# Patient Record
Sex: Female | Born: 1945 | Race: White | Hispanic: No | Marital: Married | Smoking: Never smoker
Health system: Southern US, Community
[De-identification: ages and names within clinical notes are randomized; demographics above are authoritative.]

## PROBLEM LIST (undated history)

## (undated) DIAGNOSIS — F419 Anxiety disorder, unspecified: Secondary | ICD-10-CM

## (undated) DIAGNOSIS — IMO0002 Reserved for concepts with insufficient information to code with codable children: Secondary | ICD-10-CM

## (undated) DIAGNOSIS — D369 Benign neoplasm, unspecified site: Secondary | ICD-10-CM

## (undated) DIAGNOSIS — M549 Dorsalgia, unspecified: Secondary | ICD-10-CM

## (undated) DIAGNOSIS — M797 Fibromyalgia: Secondary | ICD-10-CM

## (undated) DIAGNOSIS — Z9889 Other specified postprocedural states: Secondary | ICD-10-CM

## (undated) DIAGNOSIS — J387 Other diseases of larynx: Secondary | ICD-10-CM

## (undated) DIAGNOSIS — E7800 Pure hypercholesterolemia, unspecified: Secondary | ICD-10-CM

## (undated) DIAGNOSIS — E119 Type 2 diabetes mellitus without complications: Secondary | ICD-10-CM

## (undated) DIAGNOSIS — I10 Essential (primary) hypertension: Secondary | ICD-10-CM

## (undated) DIAGNOSIS — M1990 Unspecified osteoarthritis, unspecified site: Secondary | ICD-10-CM

## (undated) DIAGNOSIS — E039 Hypothyroidism, unspecified: Secondary | ICD-10-CM

## (undated) DIAGNOSIS — Z8719 Personal history of other diseases of the digestive system: Secondary | ICD-10-CM

## (undated) DIAGNOSIS — K222 Esophageal obstruction: Secondary | ICD-10-CM

## (undated) DIAGNOSIS — B029 Zoster without complications: Secondary | ICD-10-CM

## (undated) DIAGNOSIS — R112 Nausea with vomiting, unspecified: Secondary | ICD-10-CM

## (undated) DIAGNOSIS — K589 Irritable bowel syndrome without diarrhea: Secondary | ICD-10-CM

## (undated) DIAGNOSIS — J449 Chronic obstructive pulmonary disease, unspecified: Secondary | ICD-10-CM

## (undated) DIAGNOSIS — K449 Diaphragmatic hernia without obstruction or gangrene: Secondary | ICD-10-CM

## (undated) DIAGNOSIS — M479 Spondylosis, unspecified: Secondary | ICD-10-CM

## (undated) DIAGNOSIS — K219 Gastro-esophageal reflux disease without esophagitis: Secondary | ICD-10-CM

## (undated) DIAGNOSIS — G8929 Other chronic pain: Secondary | ICD-10-CM

## (undated) DIAGNOSIS — Z8701 Personal history of pneumonia (recurrent): Secondary | ICD-10-CM

## (undated) HISTORY — PX: LARYNX SURGERY: SHX692

## (undated) HISTORY — DX: Esophageal obstruction: K22.2

## (undated) HISTORY — PX: APPENDECTOMY: SHX54

## (undated) HISTORY — DX: Anxiety disorder, unspecified: F41.9

## (undated) HISTORY — DX: Fibromyalgia: M79.7

## (undated) HISTORY — DX: Diaphragmatic hernia without obstruction or gangrene: K44.9

## (undated) HISTORY — DX: Other specified postprocedural states: Z98.890

## (undated) HISTORY — DX: Gastro-esophageal reflux disease without esophagitis: K21.9

## (undated) HISTORY — PX: KNEE SURGERY: SHX244

## (undated) HISTORY — DX: Irritable bowel syndrome, unspecified: K58.9

## (undated) HISTORY — PX: BLADDER SUSPENSION: SHX72

## (undated) HISTORY — DX: Personal history of other diseases of the digestive system: Z87.19

## (undated) HISTORY — PX: SINUS EXPLORATION: SHX5214

## (undated) HISTORY — DX: Unspecified osteoarthritis, unspecified site: M19.90

## (undated) HISTORY — PX: CHOLECYSTECTOMY: SHX55

## (undated) HISTORY — PX: SHOULDER SURGERY: SHX246

## (undated) HISTORY — DX: Hypothyroidism, unspecified: E03.9

## (undated) HISTORY — PX: PARTIAL HYSTERECTOMY: SHX80

## (undated) HISTORY — PX: CAROTID ENDARTERECTOMY: SUR193

## (undated) HISTORY — PX: FOOT SURGERY: SHX648

## (undated) HISTORY — DX: Pure hypercholesterolemia, unspecified: E78.00

## (undated) HISTORY — PX: LAPAROSCOPY: SHX197

## (undated) HISTORY — DX: Type 2 diabetes mellitus without complications: E11.9

## (undated) HISTORY — PX: TONSILLECTOMY AND ADENOIDECTOMY: SUR1326

## (undated) HISTORY — PX: ABDOMINAL HYSTERECTOMY: SHX81

## (undated) HISTORY — PX: TUBAL LIGATION: SHX77

## (undated) HISTORY — DX: Benign neoplasm, unspecified site: D36.9

## (undated) HISTORY — DX: Spondylosis, unspecified: M47.9

## (undated) HISTORY — DX: Personal history of pneumonia (recurrent): Z87.01

## (undated) HISTORY — PX: BREAST EXCISIONAL BIOPSY: SUR124

## (undated) HISTORY — PX: UMBILICAL HERNIA REPAIR: SHX196

---

## 2003-09-20 ENCOUNTER — Encounter: Admission: RE | Admit: 2003-09-20 | Discharge: 2003-09-20 | Payer: Self-pay | Admitting: Orthopedic Surgery

## 2007-12-31 ENCOUNTER — Ambulatory Visit: Payer: Self-pay | Admitting: Internal Medicine

## 2007-12-31 LAB — CONVERTED CEMR LAB
AST: 19 units/L (ref 0–37)
Albumin: 4 g/dL (ref 3.5–5.2)
Alkaline Phosphatase: 40 units/L (ref 39–117)
Basophils Absolute: 0.1 10*3/uL (ref 0.0–0.1)
Basophils Relative: 1 % (ref 0–1)
CO2: 24 meq/L (ref 19–32)
Eosinophils Relative: 5 % (ref 0–5)
HCT: 39.4 % (ref 36.0–46.0)
Hemoglobin: 13.1 g/dL (ref 12.0–15.0)
Indirect Bilirubin: 0.5 mg/dL (ref 0.0–0.9)
Lymphocytes Relative: 27 % (ref 12–46)
MCHC: 33.2 g/dL (ref 30.0–36.0)
Monocytes Absolute: 0.9 10*3/uL (ref 0.1–1.0)
Neutro Abs: 4.8 10*3/uL (ref 1.7–7.7)
Neutrophils Relative %: 56 % (ref 43–77)
Platelets: 243 10*3/uL (ref 150–400)
Potassium: 4.4 meq/L (ref 3.5–5.3)
RBC: 4.35 M/uL (ref 3.87–5.11)
Total Protein: 6.6 g/dL (ref 6.0–8.3)

## 2008-01-03 ENCOUNTER — Ambulatory Visit (HOSPITAL_COMMUNITY): Admission: RE | Admit: 2008-01-03 | Discharge: 2008-01-03 | Payer: Self-pay | Admitting: Internal Medicine

## 2008-01-25 ENCOUNTER — Ambulatory Visit: Payer: Self-pay | Admitting: Internal Medicine

## 2008-01-25 ENCOUNTER — Ambulatory Visit (HOSPITAL_COMMUNITY): Admission: RE | Admit: 2008-01-25 | Discharge: 2008-01-25 | Payer: Self-pay | Admitting: Internal Medicine

## 2008-02-22 ENCOUNTER — Ambulatory Visit: Payer: Self-pay | Admitting: Gastroenterology

## 2008-03-02 ENCOUNTER — Ambulatory Visit (HOSPITAL_COMMUNITY): Admission: RE | Admit: 2008-03-02 | Discharge: 2008-03-02 | Payer: Self-pay | Admitting: Internal Medicine

## 2008-03-02 ENCOUNTER — Ambulatory Visit: Payer: Self-pay | Admitting: Internal Medicine

## 2008-03-02 ENCOUNTER — Encounter: Payer: Self-pay | Admitting: Internal Medicine

## 2008-03-14 ENCOUNTER — Ambulatory Visit (HOSPITAL_COMMUNITY): Admission: RE | Admit: 2008-03-14 | Discharge: 2008-03-14 | Payer: Self-pay | Admitting: Otolaryngology

## 2008-03-17 ENCOUNTER — Encounter (INDEPENDENT_AMBULATORY_CARE_PROVIDER_SITE_OTHER): Payer: Self-pay

## 2008-03-30 ENCOUNTER — Ambulatory Visit (HOSPITAL_COMMUNITY): Admission: RE | Admit: 2008-03-30 | Discharge: 2008-03-30 | Payer: Self-pay | Admitting: Internal Medicine

## 2008-09-17 ENCOUNTER — Emergency Department (HOSPITAL_COMMUNITY): Admission: EM | Admit: 2008-09-17 | Discharge: 2008-09-17 | Payer: Self-pay | Admitting: Emergency Medicine

## 2009-07-13 ENCOUNTER — Encounter: Admission: RE | Admit: 2009-07-13 | Discharge: 2009-07-13 | Payer: Self-pay | Admitting: Orthopedic Surgery

## 2009-08-25 ENCOUNTER — Emergency Department (HOSPITAL_COMMUNITY): Admission: EM | Admit: 2009-08-25 | Discharge: 2009-08-25 | Payer: Self-pay | Admitting: Emergency Medicine

## 2010-02-17 ENCOUNTER — Encounter: Payer: Self-pay | Admitting: Otolaryngology

## 2010-02-17 ENCOUNTER — Encounter: Payer: Self-pay | Admitting: Internal Medicine

## 2010-03-04 ENCOUNTER — Ambulatory Visit (HOSPITAL_COMMUNITY)
Admission: RE | Admit: 2010-03-04 | Discharge: 2010-03-04 | Disposition: A | Discharge status: Home / Self Care | Payer: Medicare Other | Source: Ambulatory Visit | Attending: Pulmonary Disease | Admitting: Pulmonary Disease

## 2010-03-04 ENCOUNTER — Other Ambulatory Visit (HOSPITAL_COMMUNITY): Payer: Self-pay | Admitting: Pulmonary Disease

## 2010-03-04 DIAGNOSIS — R0602 Shortness of breath: Secondary | ICD-10-CM

## 2010-03-04 LAB — BLOOD GAS, ARTERIAL
Bicarbonate: 25.3 mEq/L — ABNORMAL HIGH (ref 20.0–24.0)
O2 Saturation: 95.2 %
Patient temperature: 37
TCO2: 22.2 mmol/L (ref 0–100)
pCO2 arterial: 40.2 mmHg (ref 35.0–45.0)
pH, Arterial: 7.416 — ABNORMAL HIGH (ref 7.350–7.400)

## 2010-04-07 ENCOUNTER — Encounter: Payer: Self-pay | Admitting: Orthopedic Surgery

## 2010-04-07 ENCOUNTER — Emergency Department (HOSPITAL_COMMUNITY): Payer: Medicare Other

## 2010-04-07 ENCOUNTER — Emergency Department (HOSPITAL_COMMUNITY)
Admission: EM | Admit: 2010-04-07 | Discharge: 2010-04-07 | Disposition: A | Discharge status: Home / Self Care | Payer: Medicare Other | Attending: Emergency Medicine | Admitting: Emergency Medicine

## 2010-04-07 DIAGNOSIS — X500XXA Overexertion from strenuous movement or load, initial encounter: Secondary | ICD-10-CM | POA: Insufficient documentation

## 2010-04-07 DIAGNOSIS — Y92009 Unspecified place in unspecified non-institutional (private) residence as the place of occurrence of the external cause: Secondary | ICD-10-CM | POA: Insufficient documentation

## 2010-04-07 DIAGNOSIS — IMO0002 Reserved for concepts with insufficient information to code with codable children: Secondary | ICD-10-CM | POA: Insufficient documentation

## 2010-04-09 ENCOUNTER — Encounter: Payer: Self-pay | Admitting: Orthopedic Surgery

## 2010-04-10 ENCOUNTER — Ambulatory Visit (INDEPENDENT_AMBULATORY_CARE_PROVIDER_SITE_OTHER): Payer: Medicare Other | Admitting: Orthopedic Surgery

## 2010-04-10 ENCOUNTER — Encounter: Payer: Self-pay | Admitting: Orthopedic Surgery

## 2010-04-10 DIAGNOSIS — IMO0002 Reserved for concepts with insufficient information to code with codable children: Secondary | ICD-10-CM | POA: Insufficient documentation

## 2010-04-11 ENCOUNTER — Encounter: Payer: Self-pay | Admitting: Orthopedic Surgery

## 2010-04-12 ENCOUNTER — Encounter: Payer: Self-pay | Admitting: Orthopedic Surgery

## 2010-04-15 ENCOUNTER — Telehealth: Payer: Self-pay | Admitting: Orthopedic Surgery

## 2010-04-15 ENCOUNTER — Other Ambulatory Visit: Payer: Self-pay | Admitting: Orthopedic Surgery

## 2010-04-15 DIAGNOSIS — S83206A Unspecified tear of unspecified meniscus, current injury, right knee, initial encounter: Secondary | ICD-10-CM

## 2010-04-16 NOTE — Letter (Signed)
Summary: History form  History form   Imported By: Jacklynn Ganong 04/12/2010 11:35:36  _____________________________________________________________________  External Attachment:    Type:   Image     Comment:   External Document

## 2010-04-16 NOTE — Miscellaneous (Signed)
  Clinical Lists Changes  Medications: Added new medication of HYDROCODONE-ACETAMINOPHEN 7.5-325 MG TABS (HYDROCODONE-ACETAMINOPHEN) 1 q 4 for pain - Signed Added new medication of VALIUM 5 MG TABS (DIAZEPAM) 1 by mouth bef mri - Signed Added new medication of PROMETHAZINE HCL 25 MG TABS (PROMETHAZINE HCL) 1 by mouth q 4 as needed nausea - Signed Rx of HYDROCODONE-ACETAMINOPHEN 7.5-325 MG TABS (HYDROCODONE-ACETAMINOPHEN) 1 q 4 for pain;  #40 x 0;  Signed;  Entered by: Fuller Canada MD;  Authorized by: Fuller Canada MD;  Method used: Print then Give to Patient Rx of VALIUM 5 MG TABS (DIAZEPAM) 1 by mouth bef mri;  #1 x 0;  Signed;  Entered by: Fuller Canada MD;  Authorized by: Fuller Canada MD;  Method used: Print then Give to Patient Rx of PROMETHAZINE HCL 25 MG TABS (PROMETHAZINE HCL) 1 by mouth q 4 as needed nausea;  #40 x 0;  Signed;  Entered by: Fuller Canada MD;  Authorized by: Fuller Canada MD;  Method used: Print then Give to Patient    Prescriptions: PROMETHAZINE HCL 25 MG TABS (PROMETHAZINE HCL) 1 by mouth q 4 as needed nausea  #40 x 0   Entered and Authorized by:   Fuller Canada MD   Signed by:   Fuller Canada MD on 04/11/2010   Method used:   Print then Give to Patient   RxID:   [id number] VALIUM 5 MG TABS (DIAZEPAM) 1 by mouth bef mri  #1 x 0   Entered and Authorized by:   Fuller Canada MD   Signed by:   Fuller Canada MD on 04/11/2010   Method used:   Print then Give to Patient   RxID:   [id number] HYDROCODONE-ACETAMINOPHEN 7.5-325 MG TABS (HYDROCODONE-ACETAMINOPHEN) 1 q 4 for pain  #40 x 0   Entered and Authorized by:   Fuller Canada MD   Signed by:   Fuller Canada MD on 04/11/2010   Method used:   Print then Give to Patient   RxID:   [id number]

## 2010-04-16 NOTE — Assessment & Plan Note (Signed)
Summary: [facility] FOL/UP/RT KNEE"TORN LIGAMENTS"/BLUE MED/CAF   Vital Signs:  Patient profile:   65 year old female Height:      62 inches Weight:      188 pounds Pulse rate:   76 / minute Resp:     16 per minute  Vitals Entered By: Fuller Canada MD (April 10, 2010 11:13 AM)  Visit Type:  new patient Referring Provider:  [facility] Primary Provider:  Dr. Catalina Pizza  CC:  right knee pain.  History of Present Illness: I saw Krystal Powell in the office today for an initial visit.  She is a 65 years old woman with the complaint of:  right knee pain.  Pain since 04/06/10, was getting up from commode and had alot of pain.  Xrays [facility] 04/07/10 right knee.  Meds: Xanax, Prevacid, Synthroid, Atenolol, singulair, Flonase, Fenofibrate, Meclizine, Estropipate, Lasix, Potassium, Lisinopril, Tylenol.  The patient was getting up from a elevated toilet seat felt acute pop in her RIGHT knee with acute pain in the lateral side which felt like burning sensation, started 3 days ago.  The patient tried to bear weight in the leg locked.  As long as she doesn't walk or try to put weight on her leg she does fine.  She also has a joint effusion.  She cannot bend her knee.  Pain level is 10.  She had 2 RIGHT knee arthroscopies one in [place] and one in [place] she also had a LEFT knee arthroscopy in [place].        Allergies (verified): 1)  ! Augmentin 2)  ! Codeine 3)  ! * Declomycin 4)  ! Erythromycin 5)  ! Iodine 6)  ! Sulfa 7)  ! Thorazine  Past History:  Past Medical History: copd gerd asthma arthritis vertigo thyroid htn seasonal allergies  Past Surgical History: tonsils and adenoids bilateral knee arthroscopies foot shoulder hysterectomy  Family History: Family History of Diabetes Family History Coronary Heart Disease female < 23 Family History of Arthritis Hx, family, chronic respiratory condition Hx, family, asthma Hx, family, kidney disease NEC  Social  History: Patient is married.  unemployed no smoking no alcohol no caffeine some college  Review of Systems Constitutional:  Denies weight loss, weight gain, fever, chills, and fatigue. Cardiovascular:  Complains of palpitations; denies chest pain, fainting, and murmurs. Respiratory:  Complains of short of breath, wheezing, and snoring; denies couch, tightness, pain on inspiration, and snoring ; copd. Gastrointestinal:  Denies heartburn, nausea, vomiting, diarrhea, constipation, and blood in your stools. Genitourinary:  Denies frequency, urgency, difficulty urinating, painful urination, flank pain, and bleeding in urine. Neurologic:  Complains of dizziness; denies numbness, tingling, unsteady gait, tremors, and seizure. Musculoskeletal:  Complains of joint pain, swelling, instability, and stiffness; denies redness, heat, and muscle pain. Endocrine:  Complains of excessive thirst; denies exessive urination and heat or cold intolerance. Psychiatric:  Complains of anxiety; denies nervousness, depression, and hallucinations. Skin:  Denies changes in the skin, poor healing, rash, itching, and redness. HEENT:  Denies blurred or double vision, eye pain, redness, and watering. Immunology:  Complains of seasonal allergies; denies sinus problems and allergic to bee stings. Hemoatologic:  Complains of brusing; denies easy bleeding.  Physical Exam  Skin:  intact without lesions or rashes Inguinal Nodes:  no significant adenopathy Psych:  alert and cooperative; normal mood and affect; normal attention span and concentration Additional Exam:   RIGHT knee examination  The patient has lateral joint line tenderness.  Her knee flexion is  only 50 with severe pain.  The collateral ligaments are stable Lachman and anterior posterior drawer tests are normal as performed.  The McMurray's test was very difficult because I can only get the knee to 50 flexion.  Her muscle tone was normal in her quadriceps  muscle.  She did have a joint effusion.     Knee Exam  General:    Well-developed, well-nourished, normal body habitus; no deformities, normal grooming.  Gait:    her ambulation is supported by crutches and a straight leg immobilizer  Vascular:    There was no swelling or varicose veins. The pulses and temperature are normal. There was no edema or tenderness.  Sensory:    Gross coordination and sensation were normal.    Motor:    muscle tone is normal and she motor exam is graded 5  Reflexes:    reflexes are deferred   Impression & Recommendations:  Problem # 1:  TEAR MEDIAL MENISCUS (ICD-836.0) Assessment New  plain films were taken at the hospital they were normal.  I reviewed them and the x-ray report there was a small joint effusion.  Indications for MRI right knee  #1 acute trauma significant #2 joint locking #3 joint instability sensation of giving way.   #4 Lateral joint line tenderness  Orders: New Patient Level III (11914)  Patient Instructions: 1)  MRI RIGHT KNEE  2)  CONTINUE BRACE AND WALKER OR CRUTCHES    Orders Added: 1)  New Patient Level III [78295]

## 2010-04-16 NOTE — Medication Information (Signed)
Summary: copy of prescription  copy of prescription   Imported By: Jacklynn Ganong 04/12/2010 11:03:46  _____________________________________________________________________  External Attachment:    Type:   Image     Comment:   External Document

## 2010-04-17 ENCOUNTER — Ambulatory Visit (HOSPITAL_COMMUNITY)
Admission: RE | Admit: 2010-04-17 | Discharge: 2010-04-17 | Disposition: A | Discharge status: Home / Self Care | Payer: Medicare Other | Source: Ambulatory Visit | Attending: Orthopedic Surgery | Admitting: Orthopedic Surgery

## 2010-04-17 DIAGNOSIS — M23349 Other meniscus derangements, anterior horn of lateral meniscus, unspecified knee: Secondary | ICD-10-CM | POA: Insufficient documentation

## 2010-04-17 DIAGNOSIS — S83206A Unspecified tear of unspecified meniscus, current injury, right knee, initial encounter: Secondary | ICD-10-CM

## 2010-04-17 DIAGNOSIS — M25569 Pain in unspecified knee: Secondary | ICD-10-CM | POA: Insufficient documentation

## 2010-04-17 DIAGNOSIS — M25469 Effusion, unspecified knee: Secondary | ICD-10-CM | POA: Insufficient documentation

## 2010-04-25 NOTE — Progress Notes (Signed)
Summary: MRI appointment.  Phone Note Outgoing Call   Call placed by: Waldon Reining,  April 15, 2010 12:14 PM Call placed to: Patient Action Taken: Appt scheduled Summary of Call: I called to give the patient her MRI appointment at [facility] on 04-17-10 at 11:30. Patient has [facility], authorization [id number] and it expires on 05-15-10. Patient will follow up back here on 05-09-10 at 10:30 for her results.

## 2010-05-04 LAB — URINE MICROSCOPIC-ADD ON

## 2010-05-04 LAB — URINE CULTURE

## 2010-05-04 LAB — URINALYSIS, ROUTINE W REFLEX MICROSCOPIC
Ketones, ur: NEGATIVE mg/dL
Nitrite: NEGATIVE
pH: 6 (ref 5.0–8.0)

## 2010-05-09 ENCOUNTER — Ambulatory Visit (INDEPENDENT_AMBULATORY_CARE_PROVIDER_SITE_OTHER): Payer: Medicare Other | Admitting: Orthopedic Surgery

## 2010-05-09 DIAGNOSIS — S83289A Other tear of lateral meniscus, current injury, unspecified knee, initial encounter: Secondary | ICD-10-CM

## 2010-05-09 NOTE — Progress Notes (Signed)
Pain RIGHT knee.  Patient set up from the commode and felt 1/10 pain in her RIGHT knee on the lateral side. Complained of severe pain inability to walk and was sent for MRI, which shows has a lateral meniscal tear.  She's had arthroscopies before, she is familiar with the procedure. We went over the procedure again. Informed consent was done here in the office.  Reexamination shows lateral joint line tenderness, positive McMurray sign.  Patient agrees that surgery is in her best interest.  Arthroscopy RIGHT knee partial lateral meniscectomy.  Dictated history and physical labor.  Incorporate by reference.

## 2010-05-14 LAB — GLUCOSE, CAPILLARY

## 2010-05-14 LAB — BASIC METABOLIC PANEL
GFR calc Af Amer: 46 mL/min — ABNORMAL LOW (ref >60)
Potassium: 4.4 mEq/L (ref 3.5–5.1)

## 2010-05-20 ENCOUNTER — Ambulatory Visit (HOSPITAL_COMMUNITY)
Admission: RE | Admit: 2010-05-20 | Discharge: 2010-05-20 | Disposition: A | Discharge status: Home / Self Care | Payer: Medicare Other | Source: Ambulatory Visit | Attending: Specialist | Admitting: Specialist

## 2010-05-20 DIAGNOSIS — J449 Chronic obstructive pulmonary disease, unspecified: Secondary | ICD-10-CM | POA: Insufficient documentation

## 2010-05-20 DIAGNOSIS — R262 Difficulty in walking, not elsewhere classified: Secondary | ICD-10-CM | POA: Insufficient documentation

## 2010-05-20 DIAGNOSIS — M6281 Muscle weakness (generalized): Secondary | ICD-10-CM | POA: Insufficient documentation

## 2010-05-20 DIAGNOSIS — J4489 Other specified chronic obstructive pulmonary disease: Secondary | ICD-10-CM | POA: Insufficient documentation

## 2010-05-20 DIAGNOSIS — M25669 Stiffness of unspecified knee, not elsewhere classified: Secondary | ICD-10-CM | POA: Insufficient documentation

## 2010-05-20 DIAGNOSIS — IMO0001 Reserved for inherently not codable concepts without codable children: Secondary | ICD-10-CM | POA: Insufficient documentation

## 2010-05-20 DIAGNOSIS — M25569 Pain in unspecified knee: Secondary | ICD-10-CM | POA: Insufficient documentation

## 2010-05-20 DIAGNOSIS — M25469 Effusion, unspecified knee: Secondary | ICD-10-CM | POA: Insufficient documentation

## 2010-05-21 ENCOUNTER — Encounter (HOSPITAL_COMMUNITY): Payer: Medicare Other

## 2010-05-21 ENCOUNTER — Other Ambulatory Visit: Payer: Self-pay | Admitting: Orthopedic Surgery

## 2010-05-21 ENCOUNTER — Telehealth: Payer: Self-pay | Admitting: Orthopedic Surgery

## 2010-05-21 LAB — BASIC METABOLIC PANEL
CO2: 31 mEq/L (ref 19–32)
Chloride: 102 mEq/L (ref 96–112)
GFR calc non Af Amer: 45 mL/min — ABNORMAL LOW (ref >60)
Glucose, Bld: 103 mg/dL — ABNORMAL HIGH (ref 70–99)
Potassium: 4.1 mEq/L (ref 3.5–5.1)
Sodium: 139 mEq/L (ref 135–145)

## 2010-05-21 LAB — CBC
HCT: 40.8 % (ref 36.0–46.0)
Hemoglobin: 13.6 g/dL (ref 12.0–15.0)
MCV: 91.3 fL (ref 78.0–100.0)
RBC: 4.47 MIL/uL (ref 3.87–5.11)
RDW: 12.6 % (ref 11.5–15.5)
WBC: 8.2 10*3/uL (ref 4.0–10.5)

## 2010-05-21 NOTE — Telephone Encounter (Addendum)
Contacted insurer [facility] Medicare for pre-certification information,out-patient surgery scheduled 05/24/10, [facility], [place]  [id number], ICD9 [phone].  Left message on automated system, main ph# [phone] + left voicemail message at local [facility] office for Debbie, utilization review at [phone].

## 2010-05-24 ENCOUNTER — Ambulatory Visit (HOSPITAL_COMMUNITY)
Admission: RE | Admit: 2010-05-24 | Discharge: 2010-05-24 | Disposition: A | Discharge status: Home / Self Care | Payer: Medicare Other | Source: Ambulatory Visit | Attending: Orthopedic Surgery | Admitting: Orthopedic Surgery

## 2010-05-24 DIAGNOSIS — S83259A Bucket-handle tear of lateral meniscus, current injury, unspecified knee, initial encounter: Secondary | ICD-10-CM

## 2010-05-24 DIAGNOSIS — I10 Essential (primary) hypertension: Secondary | ICD-10-CM | POA: Insufficient documentation

## 2010-05-24 DIAGNOSIS — Z01812 Encounter for preprocedural laboratory examination: Secondary | ICD-10-CM | POA: Insufficient documentation

## 2010-05-24 DIAGNOSIS — S83289A Other tear of lateral meniscus, current injury, unspecified knee, initial encounter: Secondary | ICD-10-CM

## 2010-05-24 DIAGNOSIS — M23349 Other meniscus derangements, anterior horn of lateral meniscus, unspecified knee: Secondary | ICD-10-CM | POA: Insufficient documentation

## 2010-05-24 DIAGNOSIS — M23329 Other meniscus derangements, posterior horn of medial meniscus, unspecified knee: Secondary | ICD-10-CM

## 2010-05-24 DIAGNOSIS — Z79899 Other long term (current) drug therapy: Secondary | ICD-10-CM | POA: Insufficient documentation

## 2010-05-24 DIAGNOSIS — M2340 Loose body in knee, unspecified knee: Secondary | ICD-10-CM | POA: Insufficient documentation

## 2010-05-27 ENCOUNTER — Ambulatory Visit: Payer: Medicare Other | Admitting: Orthopedic Surgery

## 2010-05-27 ENCOUNTER — Encounter: Payer: Self-pay | Admitting: Orthopedic Surgery

## 2010-05-27 ENCOUNTER — Ambulatory Visit (INDEPENDENT_AMBULATORY_CARE_PROVIDER_SITE_OTHER): Payer: Medicare Other | Admitting: Orthopedic Surgery

## 2010-05-27 DIAGNOSIS — M23359 Other meniscus derangements, posterior horn of lateral meniscus, unspecified knee: Secondary | ICD-10-CM

## 2010-05-27 DIAGNOSIS — M23329 Other meniscus derangements, posterior horn of medial meniscus, unspecified knee: Secondary | ICD-10-CM

## 2010-05-27 DIAGNOSIS — M2340 Loose body in knee, unspecified knee: Secondary | ICD-10-CM

## 2010-05-27 NOTE — Progress Notes (Signed)
Status post arthroscopy of the RIGHT knee for a lateral meniscal tear. She also had a medial meniscal tear, chondral fracture of the lateral femoral condyle with a loose body from that area.  Procedure partial medial meniscectomy, partial lateral meniscectomy, abrasion chondroplasty, lateral femoral condyle, removal of loose body.  Operative findings tear of the posterior horn of the lateral meniscus, degenerative, lateral meniscal tear, anterior horn. Chondral fracture, lateral femoral condyle, measuring 8 x 6 mm. This formed a loose body. There was a tear of the posterior horn of the medial meniscus. 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  Date of surgery April 27  Minimal complaints.  Ambulates with walker, full weightbearing. Knee range of motion 95. Portals clean.  Return 3 weeks. Will probably do on her own for PT after one visit

## 2010-05-28 ENCOUNTER — Telehealth: Payer: Self-pay | Admitting: Orthopedic Surgery

## 2010-05-28 ENCOUNTER — Ambulatory Visit (HOSPITAL_COMMUNITY)
Admission: RE | Admit: 2010-05-28 | Discharge: 2010-05-28 | Disposition: A | Discharge status: Home / Self Care | Payer: Medicare Other | Source: Ambulatory Visit | Attending: Internal Medicine | Admitting: Internal Medicine

## 2010-05-28 DIAGNOSIS — J449 Chronic obstructive pulmonary disease, unspecified: Secondary | ICD-10-CM | POA: Insufficient documentation

## 2010-05-28 DIAGNOSIS — M6281 Muscle weakness (generalized): Secondary | ICD-10-CM | POA: Insufficient documentation

## 2010-05-28 DIAGNOSIS — IMO0001 Reserved for inherently not codable concepts without codable children: Secondary | ICD-10-CM | POA: Insufficient documentation

## 2010-05-28 DIAGNOSIS — R262 Difficulty in walking, not elsewhere classified: Secondary | ICD-10-CM | POA: Insufficient documentation

## 2010-05-28 DIAGNOSIS — M25569 Pain in unspecified knee: Secondary | ICD-10-CM | POA: Insufficient documentation

## 2010-05-28 DIAGNOSIS — M25669 Stiffness of unspecified knee, not elsewhere classified: Secondary | ICD-10-CM | POA: Insufficient documentation

## 2010-05-28 DIAGNOSIS — J4489 Other specified chronic obstructive pulmonary disease: Secondary | ICD-10-CM | POA: Insufficient documentation

## 2010-05-28 NOTE — Discharge Summary (Signed)
[name], [name]               ACCOUNT NO.:  [id number]  MEDICAL RECORD NO.:  [id number]           PATIENT TYPE:  O  LOCATION:  DAY                           FACILITY:  [facility]  PHYSICIAN:  Vickki Hearing, M.D.DATE OF BIRTH:  04-18-1945  DATE OF ADMISSION:  05/15/2010 DATE OF DISCHARGE:  LH                         DISCHARGE SUMMARY-REFERRING   History and physical for surgery scheduled for May 24, 2010.  PATIENT PROFILE:  A 65 year old female with a height of 62 inches and a weight of 188 pounds, pulse rate of 76 and respiratory rate of 16.  PRIMARY CARE PROVIDER:  Catalina Pizza, MD  CHIEF COMPLAINT:  Right knee pain.  HISTORY:  Krystal Powell is a lady I have followed, she is 65 years old. She has right knee pain since April 06, 2010 when she was getting up from the commode.  She had increased pain at that time and was treated nonoperatively.  X-rays at the hospital on April 07, 2010, showed no fracture.  She felt a pop when she got up, acute pain on the lateral side of the knee, which felt like a burning sensation.  She tried to bear weight the leg locked.  Since that time, she had trouble walking. She had a joint effusion, could not bend her knee.  Her pain level was initially at 10.  She has had 2 right knee arthroscopies one in [facility], one in [place].  Krystal Powell has had a left knee arthroscopy in [place].  She did have an MRI on April 15, 2010, and it shows that she has a tear of the anterior horn of lateral meniscus edema in the posterior aspect of the lateral femoral condyle, which may be overlying with some chondromalacia.  She has a blunting of the posterior horn of the medial meniscus from previous meniscectomy.  As I discussed with her, she may have arthritis.  This may be an acute meniscal tear.  She has opted to have the surgery.  ALLERGIES:  She is allergic to AUGMENTIN, CODEINE, DECLOMYCIN, ERYTHROMYCIN IV, SULFA and THORAZINE.  PAST  HISTORY: 1. COPD. 2. Reflux. 3. Asthma. 4. Arthritis. 5. Vertigo. 6. Thyroid disease. 7. Hypertension. 8. Seasonal allergies.  PREVIOUS SURGERIES: 1. Tonsillectomy. 2. Adenoidectomy. 3. Foot surgery. 4. Shoulder surgery. 5. Hysterectomy.  FAMILY HISTORY:  Noted for diabetes, heart disease, arthritis, chronic respiratory condition, asthma, and kidney disease.  SOCIAL HISTORY:  She is married, unemployed.  Does not smoke or drink. She does have some college credits.  REVIEW OF SYSTEMS:  Positive for palpitations, shortness of breath, wheezing, snoring, dizziness, joint pain, swelling, instability, stiffness, excessive thirst, anxiety, seasonal allergies, bruising easily.  Other systems reviewed were negative.  PHYSICAL EXAMINATION:  GENERAL:  Well-developed, well-nourished, normal body habitus.  No deformities.  Normal grooming.  Ambulation is notable for limp. VASCULAR:  No swelling or varicose veins.  Pulse and temperature normal. No edema or tenderness. SENSORY:  Grossly normal. MOTOR:  Normal with motor grade 5.  Reflexes were initially deferred, but subsequently were normal.  She has lateral joint line tenderness with flexion limited less than 90 degrees or Lachman test  is stable.  Posterior drawer test is normal.  Maneuver tests was difficult, but seemed to be positive.  She had normal quadriceps muscle tone and with the joint effusion.  She is awake, alert and oriented x3.  Normal mood and affect, attention span and concentration.  Inguinal lymph nodes were normal.  Skin was intact.  Upper extremities were normal.  IMPRESSION:  Osteoarthritis of torn lateral meniscus.  PLAN:  Arthroscopy, right knee partial lateral meniscectomy, scheduled for postop visit May 27, 2010.     Vickki Hearing, M.D.     SEH/MEDQ  D:  05/23/2010  T:  05/23/2010  Job:  [id number]  Electronically Signed by Fuller Canada M.D. on 05/28/2010 09:56:33 AM

## 2010-05-28 NOTE — Op Note (Signed)
NAMEJAZSMIN, COUSE               ACCOUNT NO.:  [id number]  MEDICAL RECORD NO.:  [id number]           PATIENT TYPE:  O  LOCATION:  DAYP                          FACILITY:  [facility]  PHYSICIAN:  Vickki Hearing, M.D.DATE OF BIRTH:  1945-12-30  DATE OF PROCEDURE: DATE OF DISCHARGE:                              OPERATIVE REPORT   Ms. Andringa is 65 years old.  She got up from the commode, felt a loud snap and pain on the lateral side of her knee and eventually had an MRI which showed a lateral meniscal tear along with some arthritis.  After failing to improve with nonoperative treatment, she was advised that surgical treatment would be in her best interest.  The risk benefit ratio of the surgical and nonoperative treatments were presented to the patient and the patient opted for surgical treatment.  PREOPERATIVE DIAGNOSIS:  Lateral meniscal tear, right knee.  POSTOPERATIVE DIAGNOSIS:  Lateral meniscal tear, medial meniscal tear, chondral fracture, lateral femoral condyle loose body.  PROCEDURE:  Arthroscopy right knee partial medial meniscectomy, partial lateral meniscectomy, abrasion chondroplasty, lateral femoral condyle and removal of loose body.  SURGEON:  Vickki Hearing, MD.  ANESTHETIC:  General.  OPERATIVE FINDINGS:  There was a tear of the posterior horn of the lateral meniscus degenerative, lateral meniscal tear anterior horn. There was a chondral fracture of the lateral femoral condyle approximately 8 x 6 mm.  There was a loose body and there was a tear of the posterior horn of the medial meniscus.  There was some arthritis of the trochlea.  DETAILS OF PROCEDURE:  The patient was identified in the preop area, the right knee was marked for surgery and countersigned.  The patient was marked, reviewed the chart and updated it.  The patient was given a preop dose of Benadryl followed by Rocephin due to several MYCIN allergy, she tolerated that well.  She was  taken to the operating room for general anesthesia.  At that point, we prepped the knee and leg on the right and then draped sterilely.  It was in an arthroscopic leg holder and the opposite leg was padded in a well-leg holder.  After completing the time-out, a lateral portal was established and the scope was introduced to the medial compartment and diagnostic arthroscopy was commenced.  We saw a tear of the posterior horn of the medial meniscus, grade 1 changes of the medial femoral condyle and medial tibial plateau.  ACL had some degenerative changes, but was stable.  The lateral meniscus posterior horn had a free edge tear and then the lateral meniscus anterior horn had a longitudinal tear.  There was also a chondral lesion of the lateral femoral condyle with a loose body.  The trochlea was noted to be degenerative with grade 2 changes and the patella was relatively normal.  Through a medial portal, we debrided the medial meniscus and balanced it with a shaver removing meniscal fragments.  Posterior horn was confirmed to be stable using a probe.  On the lateral side, we removed the loose body, used a shaver to resect the free edge of the posterior horn  as well as the anterior horn probe to confirm stability of the rim.  We then did an abrasion arthroplasty of the lateral femoral condyle with a shaver until a bleeding bed was noted.  The knee was then irrigated and closed with 3-0 nylon interrupted sutures.  We injected 60 mL of Marcaine with epinephrine, applied sterile dressings, Ace wrap and activated a cryo cuff.  The patient was extubated and taken to recovery room in stable condition.  She will be weightbearing as tolerated.  She is discharged on hydrocodone 7.5 mg one q. 4 p.r.n. for pain, and follow up visit has been scheduled for Monday with physical therapy to start Tuesday.     Vickki Hearing, M.D.     SEH/MEDQ  D:  05/24/2010  T:  05/24/2010  Job:   [id number]  Electronically Signed by Fuller Canada M.D. on 05/28/2010 09:56:37 AM

## 2010-06-04 ENCOUNTER — Ambulatory Visit (HOSPITAL_COMMUNITY): Payer: Medicare Other

## 2010-06-05 ENCOUNTER — Ambulatory Visit (HOSPITAL_COMMUNITY)
Admission: RE | Admit: 2010-06-05 | Discharge: 2010-06-05 | Disposition: A | Discharge status: Home / Self Care | Payer: Medicare Other | Source: Ambulatory Visit | Attending: Allergy and Immunology | Admitting: Allergy and Immunology

## 2010-06-05 ENCOUNTER — Other Ambulatory Visit: Payer: Self-pay | Admitting: Allergy and Immunology

## 2010-06-05 DIAGNOSIS — J329 Chronic sinusitis, unspecified: Secondary | ICD-10-CM

## 2010-06-05 DIAGNOSIS — R05 Cough: Secondary | ICD-10-CM

## 2010-06-05 DIAGNOSIS — R059 Cough, unspecified: Secondary | ICD-10-CM

## 2010-06-05 DIAGNOSIS — J309 Allergic rhinitis, unspecified: Secondary | ICD-10-CM

## 2010-06-11 NOTE — Op Note (Signed)
NAME:  Krystal Powell, Krystal Powell               ACCOUNT NO.:  [id number]   MEDICAL RECORD NO.:  [id number]          PATIENT TYPE:  AMB   LOCATION:  DAY                           FACILITY:  [facility]   PHYSICIAN:  R. Roetta Sessions, M.D. DATE OF BIRTH:  01-Mar-1945   DATE OF PROCEDURE:  01/25/2008  DATE OF DISCHARGE:                               OPERATIVE REPORT   EGD with [name] dilation.   INDICATIONS FOR PROCEDURE:  A 65 year old lady with multiple medical  problems including history of upper abdominal pain beginning this month  in the setting of constipation, and she presented with quite prominent  symptoms when I saw her on December 31, 2007.  Workup ensued including CT  of the abdomen and pelvis which revealed no acute inflammatory  neoplastic findings to explain her symptoms.  She also underwent a  battery of labs including amylase and lipase which came back normal at  23 and 30 respectively.  CBC came back entirely normal.  Creatinine was  slightly elevated at 1.35.  LFTs completely normal.  She says with  improvement in constipation her abdominal pain has settled down,  although she does have esophageal dysphagia to solids and has been  dilated by Dr. Abagail Kitchens over at [facility] previously.  She has  a history colonic adenomas and is due for surveillance soon.  EGD is now  being done to further evaluate her symptoms.  Risks, benefits,  alternatives, limitations have been reviewed, questions answered.  She  is agreeable.  Please see the documentation in the medical record.   PROCEDURE NOTE:  O2 saturation, blood pressure, pulse, respirations were  monitored throughout the entirety of the procedure.   CONSCIOUS SEDATION:  Versed 5 mg IV and Demerol 100 mg IV in divided  doses.  Phenergan 12.5 mg diluted slow IV push to augment conscious  sedation.  Cetacaine spray for topical pharyngeal anesthesia.   INSTRUMENT:  Pentax video chip system.   FINDINGS:  Examination of the tubular  esophagus revealed some degree of  corkscrewing distally.  There was a spiral incomplete noncritical  appearing ring at the EG junction.  Please see photos.  There was no  inflammatory neoplastic changes.  EG junction was widely patent, easily  traversed.  Stomach:  Gastric cavity was empty, insufflated well with  air.  Thorough examination of the gastric mucosa including retroflex of  the proximal stomach esophagogastric junction demonstrated only a hiatal  hernia and a couple of focal antral/prepyloric erosions.  There is no  infiltrating process or ulcer.  Pylorus was patent, easily traversed.  Examination of the bulb and second portion revealed no abnormalities.   THERAPEUTIC/DIAGNOSTIC MANEUVERS PERFORMED:  Scope was withdrawn.  A 56-  French Maloney dilator was passed to full insertion with ease.  A look  back revealed no apparent complication related to passage of the  dilator.  The patient tolerated the procedure well, was reactive to  Endoscopy.   IMPRESSION:  Some corkscrewing of the distal esophagus, incomplete  noncritical appearing Schatzki ring status post passage of a Maloney  dilator (56-French) as described above.  Otherwise,  unremarkable  esophagus, hiatal hernia, focal antral pyloric channel erosions,  otherwise gastric mucosa appeared unremarkable.  Pylorus was patent.  Normal D1 and D2.   RECOMMENDATIONS:  1. Continue Aciphex 20 mg orally daily.  2. We will have Ms. Sefcik return for followup in 1 month.  At that      point in time, we will get her setup for a colonoscopy.  Per her      prior observations when she has had colonoscopies previously, she      has not been adequately sedated and the exam is quite uncomfortable      for her, and therefore we will move towards setting her up the      first of the year for colonoscopy under propofol sedation.      Jonathon Bellows, M.D.  Electronically Signed     RMR/MEDQ  D:  01/25/2008  T:  01/25/2008  Job:   [id number]   cc:   Catalina Pizza, M.D.  Fax: [phone]

## 2010-06-11 NOTE — H&P (Signed)
NAME:  Krystal Powell, Krystal Powell               ACCOUNT NO.:  [id number]   MEDICAL RECORD NO.:  [id number]          PATIENT TYPE:  AMB   LOCATION:  DAY                           FACILITY:  [facility]   PHYSICIAN:  R. Roetta Sessions, M.D. DATE OF BIRTH:  30-Aug-1945   DATE OF ADMISSION:  DATE OF DISCHARGE:  LH                              HISTORY & PHYSICAL   PRIMARY CARE PHYSICIAN:  Catalina Pizza, M.D.   CHIEF COMPLAINT:  Abdominal pain, constipation.   HISTORY OF PRESENT ILLNESS:  Krystal Powell is a pleasant 62-year  obese Caucasian female recently relocated from [place] from [place],  [place], who presents as a new patient today with a 3-day history  of band-like upper abdominal pain.  [name] says she has had  abdominal pain for 30 years.  She has had similar attacks in the past,  although this one was associated with some abdominal distention and  nausea but no vomiting and she has had a notable drop off in bowel  movement frequency over the past 3 days.  She took some enemas and  laxatives last night and finally produced a copious stool and now feels  all cleaned out.  She has not had any melena or rectal bleeding.  Abdominal pain persists.  She purposely backed off on her oral intake  the past couple of days because of the abdominal discomfort.  She feels  like she has a ball in her epigastric area.  She has longstanding  gastroesophageal reflux disease symptoms and recently went from  proprietary Prevacid to a generic component.  She took an extra dose of  Prevacid yesterday, without much improvement in her symptoms.  She has a  history of a peptic stricture by her description and has been seen by a  gastroenterologist over at [facility] (Dr. Abagail Kitchens).  He dilated her  esophagus back in March of this year but she states it really did not  make a great deal of difference.  She has been dilated multiple times in  the past.  She continues to have intermittent esophageal dysphagia  to  solids.  She also has a history of colonic polyps.  She last underwent a  colonoscopy 5-1/2 years ago over there and is somewhat overdue for a  surveillance exam.   She has not had any fever or chills.  Gallbladder was removed at [facility] 2 years ago.  She was said to have gallstones.  Her  appendix is also out.   In addition, she has noted from time to time perhaps a ping pong ball  sized bulge coming out her umbilicus.   She has been on Celebrex for years without any apparent complications.  She tells me she has a longstanding history of a sore in her stomach  but tells me that she was told it is not an ulcer.  It showed up on an  upper GI series years ago and was noted on prior upper endoscopies.   PAST MEDICAL HISTORY:  Significant for gastroesophageal reflux disease,  asthma, COPD, hypertension, spondylosis, hypercholesterolemia, vertigo,  allergies, anxiety neurosis, hypothyroidism.   PAST SURGERIES:  Tonsillectomy, adenoidectomy, bladder tacking,  oophorectomy, appendectomy, cholecystectomy, exploratory laparotomy,  multiple breast biopsies for fibrocystic disease.  She has some type of  carotid artery procedure previously, right shoulder surgery, both knees,  right foot and 2 sinus surgeries.   ALLERGIES:  IODINE, CODEINE, AUGMENTIN, SULFA, ERYTHROMYCIN, POSSIBLY  DOXYCYCLINE.   CURRENT MEDICATIONS:  Singulair 10 mg daily, Tricor 145 mg daily,  alprazolam 0.5 mg at bedtime, lisinopril 20 mg daily, Tekturna 300 mg  b.i.d., meclizine 25 mg b.i.d., furosemide 20 mg daily, carvedilol 6.25  mg b.i.d., Advair Diskus 500/300 b.i.d., Prevacid 30 mg orally daily,  __________ 1.5 mg daily, fexofenadine 180 mg daily, Astelin nasal spray  b.i.d., Klor-Con daily, ipratropium, Atrovent inhaler, levothyroxine 100  mcg daily, Celebrex 200 mg b.i.d.  Vitamin B, C, D, E supplements,  grapeseed extract, fish oil, calcium, Biotin, Fibercon, Tylenol p.r.n.   FAMILY HISTORY:   Father died with heart and lung disease.  Mother died  at age 50, aspiration pneumonia, CVA.  No history of chronic GI or liver  illness.   SOCIAL HISTORY:  The patient is married.  She is disabled.  No tobacco  or alcohol.  She previously lived in [place] and [place], [place], and recently [place] before moving to [place].  No illicit  drugs.   REVIEW OF SYSTEMS:  Intermittent shortness of breath.  Has not had any  clay-colored stools, dark-colored urine, yellow jaundice, no fever,  chills.   PHYSICAL EXAMINATION:  A pleasant, somewhat chronically ill-appearing 3-  year-old lady resting comfortably.  Weight 194, height 5 feet 2 inches.  Temperature 98.2, BP 140/80, pulse 72.  SKIN:  Warm and dry with no jaundice, no stigmata of chronic liver  disease.  HEENT EXAM:  No scleral icterus.  Conjunctivae are pink.  Oral cavity:  No lesions.  CHEST:  Lungs are clear to auscultation.  BREAST EXAM:  Deferred.  ABDOMEN:  Obese.  Positive bowel sounds.  No bruits.  The abdomen is  soft.  She does have some tenderness throughout her left and right upper  quadrants, particularly in the epigastric area.  There is no rebound or  guarding.  I do not appreciate any mass or organomegaly.  She does have  what appears to be an umbilical hernia without any bowel or other  structures palpable.  EXTREMITY EXAM:  No edema.  RECTAL EXAM:  Good sphincter tone.  No mass in the rectal vault.  No  stool in the rectal vault.  Mucous Hemoccult negative.   IMPRESSION:  Krystal Powell is a pleasant 65 year old lady with a  somewhat complicated medical history who is now having a 3-day history  of upper abdominal pain with nausea and diminution in bowel function  which is now improved with laxatives and enemas.  She perceives  transient constipation.   She has had similar symptoms of abdominal pain over the years but really  has never had any bowel problems.   She has an interesting  history of a chronic sore in her stomach and a  history of a peptic stricture for she underwent  esophagogastroduodenoscopy dilation back in March.   She also notes a bulge around her umbilicus and indeed likely has an  umbilical hernia.  She has a history of colonic polyps.  Her gallbladder  and appendix are out.   At this time it does not appear that Ms. Kutner has an imminent  surgical process.  I was somewhat concerned about her use of Celebrex  but she has been using concomitant proton pump inhibitor therapy.  I  suppose she could have had had a transient partial small bowel  obstruction recently to explain some of her symptoms as well.   RECOMMENDATIONS:  1. Will go ahead and initiate workup with a CT of the abdomen and      pelvis with oral contrast only.  Determine serum lipase, amylase,      CBC, LFTs and a BMET.  2. Will switch her out from generic Prevacid to Aciphex and will give      her 2 weeks plus samples 20 mg once daily.  She is to take a dose      when she gets home.  I will add Carafate 1 gram slurry q.i.d. x5      days to her regimen.  3. Depending on laboratory and CT evaluation, she will subsequently      likely need to go ahead and undergo an EGD in the near future with      plans to dilate her stricture as appropriate and for further      diagnostic purposes.  Ms. Besecker tells me that she can have an      upper endoscopy with simply conscious sedation and Cetacaine spray,      however, if she has a colonoscopy, she has to be done under      propofol because colonoscopies have been too painful for her in the      past.  Indeed, with her history of colonic polyps, at some point we      will get to a surveillance colonoscopy as well sometime between now      and the first of the year.  Would certainly like to retrieve      records from [facility] to shed more light on her      workup and findings in the past.   I told Ms. Sinclair I agree with her  backing off on her diet until she is  feeling better.   I also told Ms. Breeding that if her pain is worsening or at a late hour  on a Friday afternoon, if her pain at all becomes worse or if she  develops a fever, I have advised her to visit the nearest emergency  department.  I will make further recommendations to Ms. Leija in  regard to her symptoms in the very near future once we get studies back  for review.      Jonathon Bellows, M.D.  Electronically Signed     RMR/MEDQ  D:  12/31/2007  T:  12/31/2007  Job:  [id number]

## 2010-06-11 NOTE — H&P (Signed)
NAMESONORA, CATLIN               ACCOUNT NO.:  [id number]   MEDICAL RECORD NO.:  [id number]          PATIENT TYPE:  AMB   LOCATION:  DAY                           FACILITY:  [facility]   PHYSICIAN:  R. Roetta Sessions, M.D. DATE OF BIRTH:  1945/07/03   DATE OF ADMISSION:  02/22/2008  DATE OF DISCHARGE:  LH                              HISTORY & PHYSICAL   PRIMARY CARE PHYSICIAN:  Catalina Pizza, M.D.   PRIMARY GASTROENTEROLOGIST:  Jonathon Bellows, M.D.   CHIEF COMPLAINT:  Follow up EGD, due for surveillance colonoscopy.   HISTORY OF PRESENT ILLNESS:  Ms. Babel is a 65 year old Caucasian  female.  She recently had EGD by Dr. Jena Gauss with [name] dilatation.  She  was found to have some corkscrew in the distal esophagus and complete  noncritical Schatzki's ring which was dilated with a 56-French Maloney  dilator and an otherwise normal exam.  She had been having some upper  abdominal pain and constipation which has since resolved.  She tells me  she had been taking Aciphex 20 mg daily.  This is not covered by her  insurance.  She is able to get Prevacid 30 mg daily for much cheaper and  has not seen much of a difference.  She tells me overall she feels well.  She is ready to schedule the procedure today.  She denies any anorexia  or early satiety.  Denies any rectal bleeding or melena.  Denies any  diarrhea or constipation at this time.   PAST MEDICAL/SURGICAL HISTORY:  1. She has history of colonic polyps at [facility], Dr. Abagail Kitchens.  2. EGD as described in HPI.  3. GERD.  4. Asthma.  5. COPD.  6. Hypertension.  7. Spondylosis.  8. Hypercholesterolemia.  9. Vertigo.  10.Allergies.  11.Anxiety neurosis.  12.Hypothyroidism.  13.Tonsillectomy.  14.Adenoidectomy.  15.Bladder tacking.  16.Oophorectomy.  17.Appendectomy.  18.Cholecystectomy.  19.Exploratory laparotomy.  20.Multiple biopsies for fibrocystic disease.  21.She had a carotid endarterectomy.  22.Right shoulder  surgery.  23.Both knees and right foot.  24.Two sinus surgeries.   CURRENT MEDICATIONS:  1. Singulair 10 mg daily.  2. Tricor 145 mg daily.  3. Alprazolam 0.5 mg q.h.s.  4. Lisinopril 20 mg daily.  5. Tekturna 300 mg b.i.d.  6. Meclizine 25 mg b.i.d.  7. Furosemide 20 mg daily.  8. Carvedilol 6.25 mg b.i.d.  9. Advair Discus 5/50 mcg b.i.d.  10.Prevacid 30 mg daily.  11.Astelin nasal spray b.i.d.  12.Klor-Con 10 mEq daily.  13.Ipratropium/albuterol p.r.n.  14.Levothyroxine 100 mcg daily.  15.Celebrex 20 mg daily.  16.Vitamin B, C, D, E daily.  17.Grape seed extract daily.  18.Fish oil daily.  19.Calcium daily.  20.Biotin daily.  21.FiberCon daily.  22.Tylenol 2 in the morning and 2 in the evening.  23.Aciphex 20 mg just discontinued.   ALLERGIES:  IODINE, CODEINE, AUGMENTIN, SULFA, ERYTHROMYCIN, DOXYCYCLINE  and DEMEROL cause nausea and vomiting.   FAMILY HISTORY:  The father deceased with heart and lung disease.  Mother deceased at 25 secondary to  aspiration pneumonia and CVA.  No  known history of  colon carcinoma or chronic GI problems.   SOCIAL HISTORY:  She is married.  She is disabled.  She denies any  alcohol, tobacco or drug use.   REVIEW OF SYSTEMS:  See HPI, otherwise negative.   PHYSICAL EXAMINATION:  VITAL SIGNS:  Weight 191 pounds, height 62  inches, temperature 97.4, blood pressure 124/82, pulse 60.  GENERAL:  She is an obese Caucasian female who is alert, oriented,  pleasant, cooperative in no acute distress.  HEENT:  Sclerae are clear, nonicteric.  Conjunctivae are pink.  Oropharynx moist without any lesions.  NECK:  Supple without thyromegaly.  CHEST/HEART:  Regular rate and rhythm.  Normal S1-S2.  No murmurs,  clicks, rubs or gallops.  LUNGS:  Clear to auscultation bilaterally.  ABDOMEN:  Protuberant.  Positive bowel sounds x4.  No bruits  auscultated.  She does have a tiny easily reducible and nontender  umbilical hernia.  There is no rebound  tenderness or guarding.  No  hepatosplenomegaly or mass.  EXTREMITIES:  Without clubbing or edema.   IMPRESSION:  1. Ms. Ravelo is a 65 year old Caucasian female with history of      colonic polyps.  She tells me she is due for surveillance.  We will      go ahead and set up that exam.  2. She has chronic complicated gastroesophageal reflux disease with      Schatzki's ring and hiatal hernia.   PLAN:  1. Surveillance colonoscopy with Dr. Jena Gauss in the near future.  She      will be done in the OR with propofol given her history of      difficulty with sedation previously.  Risks and benefits have been      discussed which include but are not limited to bleeding, infection,      perforation, drug reaction.  She agrees with plan and consent will      be obtained.  2. Resume Prevacid 30 mg daily.      Lorenza Burton, N.P.      Jonathon Bellows, M.D.  Electronically Signed    KJ/MEDQ  D:  02/22/2008  T:  02/22/2008  Job:  [id number]   cc:   Catalina Pizza, M.D.  Fax: [phone]

## 2010-06-11 NOTE — Op Note (Signed)
NAMEHEBAH, BOGOSIAN               ACCOUNT NO.:  [id number]   MEDICAL RECORD NO.:  [id number]          PATIENT TYPE:  AMB   LOCATION:  DAY                           FACILITY:  [facility]   PHYSICIAN:  R. Roetta Sessions, M.D. DATE OF BIRTH:  [birth year]   DATE OF PROCEDURE:  03/02/2008  DATE OF DISCHARGE:                               OPERATIVE REPORT   PROCEDURE:  Colonoscopy with snare polypectomy.   INDICATIONS FOR PROCEDURE:  A 62-year lady with history of colonic  polyps.  She is due for surveillance.  She is currently having no lower  GI tract symptoms.  Risks, benefits, alternatives, and limitations have  been reviewed previously and again at the bedside.  Because of her  polypharmacy, she is done with the help of anesthesia with propofol.  Please see the documentation in the medical record.   PROCEDURE NOTE:  O2 saturation, blood pressure, pulse, and respirations  were monitored throughout the entire procedure.   CONSCIOUS SEDATION:  (Propofol per anesthesia).   INSTRUMENT:  Pentax video chip system.   FINDINGS:  Digital rectal exam revealed no abnormalities.  Endoscopic  findings:  The prep was good.  Colon:  Colonic mucosa was surveyed from  the rectosigmoid junction through the left transverse, right colon, to  the appendiceal orifice, ileocecal valve, and cecum.  These structures  were well seen and photographed for the record.  From this level, the  scope was slowly withdrawn.  All previously mentioned mucosal surfaces  were again surveyed.  The patient had scattered pancolonic diverticula  and a 6-mm polyp on a fold of the  splenic flexure which was hot snared  and removed.  There was one tiny oozing site at the periphery of the  polypectomy base which was touched with the tip of the snare cautery  catheter.  The remainder of the colonic mucosa appeared unremarkable.  The scope was pulled down in the rectum where thorough examination of  the rectal mucosa was undertaken.   The rectum vault was small; so, I  unable to retroflex, but for the same reason I was able to see the  rectal mucosa very well and appeared normal.  The patient tolerated the  procedure well and was reacted to Endoscopy.   IMPRESSION:  1. Normal rectum.  2. Few scattered pancolonic diverticula.  Polyp at the splenic flexure      status post hot snare removal.   RECOMMENDATIONS:  1. No aspirin or arthritis medications  for 5 days.  2. Followup on path.  3. Diverticulosis literature provided to Ms. Qian.  4. Further recommendations to follow.      Jonathon Bellows, M.D.  Electronically Signed     RMR/MEDQ  D:  03/02/2008  T:  03/02/2008  Job:  [id number]   cc:   Catalina Pizza, M.D.  Fax: [phone]

## 2010-06-14 ENCOUNTER — Ambulatory Visit (HOSPITAL_COMMUNITY)
Admission: RE | Admit: 2010-06-14 | Discharge: 2010-06-14 | Disposition: A | Discharge status: Home / Self Care | Payer: Medicare Other | Source: Ambulatory Visit | Attending: Internal Medicine | Admitting: Internal Medicine

## 2010-06-14 ENCOUNTER — Encounter (HOSPITAL_COMMUNITY): Payer: Self-pay

## 2010-06-14 ENCOUNTER — Other Ambulatory Visit (HOSPITAL_COMMUNITY): Payer: Self-pay | Admitting: Internal Medicine

## 2010-06-14 DIAGNOSIS — R600 Localized edema: Secondary | ICD-10-CM

## 2010-06-14 DIAGNOSIS — R0602 Shortness of breath: Secondary | ICD-10-CM

## 2010-06-14 DIAGNOSIS — R059 Cough, unspecified: Secondary | ICD-10-CM

## 2010-06-14 DIAGNOSIS — R05 Cough: Secondary | ICD-10-CM | POA: Insufficient documentation

## 2010-06-14 DIAGNOSIS — R609 Edema, unspecified: Secondary | ICD-10-CM | POA: Insufficient documentation

## 2010-06-14 HISTORY — DX: Chronic obstructive pulmonary disease, unspecified: J44.9

## 2010-06-14 HISTORY — DX: Essential (primary) hypertension: I10

## 2010-06-18 ENCOUNTER — Ambulatory Visit: Payer: Medicare Other | Admitting: Orthopedic Surgery

## 2010-06-20 ENCOUNTER — Ambulatory Visit (INDEPENDENT_AMBULATORY_CARE_PROVIDER_SITE_OTHER): Payer: Medicare Other | Admitting: Orthopedic Surgery

## 2010-06-20 DIAGNOSIS — M23302 Other meniscus derangements, unspecified lateral meniscus, unspecified knee: Secondary | ICD-10-CM

## 2010-06-20 DIAGNOSIS — IMO0002 Reserved for concepts with insufficient information to code with codable children: Secondary | ICD-10-CM

## 2010-06-20 DIAGNOSIS — M2340 Loose body in knee, unspecified knee: Secondary | ICD-10-CM

## 2010-06-20 DIAGNOSIS — M179 Osteoarthritis of knee, unspecified: Secondary | ICD-10-CM

## 2010-06-20 DIAGNOSIS — M23202 Derangement of unspecified lateral meniscus due to old tear or injury, unspecified knee: Secondary | ICD-10-CM

## 2010-06-20 DIAGNOSIS — M1710 Unilateral primary osteoarthritis, unspecified knee: Secondary | ICD-10-CM

## 2010-06-20 NOTE — Progress Notes (Signed)
Status post arthroscopy of the RIGHT knee for a lateral meniscal tear. She also had a medial meniscal tear, chondral fracture of the lateral femoral condyle with a loose body from that area.   Procedure partial medial meniscectomy, partial lateral meniscectomy, abrasion chondroplasty, lateral femoral condyle, removal of loose body.   Operative findings tear of the posterior horn of the lateral meniscus, degenerative, lateral meniscal tear, anterior horn. Chondral fracture, lateral femoral condyle, measuring 8 x 6 mm. This formed a loose body. There was a tear of the posterior horn of the medial meniscus. 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   Date of surgery April 27  Doing well with her home therapy program. She has full extension of her knee with some mild lateral tenderness. Minimal swelling.  Portal sites clean.  Continue home exercise. Recheck in one

## 2010-07-25 ENCOUNTER — Ambulatory Visit (INDEPENDENT_AMBULATORY_CARE_PROVIDER_SITE_OTHER): Payer: Medicare Other | Admitting: Orthopedic Surgery

## 2010-07-25 DIAGNOSIS — M2400 Loose body in unspecified joint: Secondary | ICD-10-CM | POA: Insufficient documentation

## 2010-07-25 DIAGNOSIS — IMO0002 Reserved for concepts with insufficient information to code with codable children: Secondary | ICD-10-CM

## 2010-07-25 DIAGNOSIS — M1710 Unilateral primary osteoarthritis, unspecified knee: Secondary | ICD-10-CM

## 2010-07-25 NOTE — Progress Notes (Signed)
   Postoperative visit  Date of surgery April 20  Procedure partial medial meniscectomy, partial lateral meniscectomy, abrasion chondroplasty, lateral femoral condyle, removal of loose body.  Operative findings tear of the posterior horn of the lateral meniscus, degenerative, lateral meniscal tear, anterior horn. Chondral fracture, lateral femoral condyle, measuring 8 x 6 mm. This formed a loose body. There was a tear of the posterior horn of the medial meniscus. 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   Tylenol for pain   She says she does fine until she stands up and then the pain will shoot on the medial and lateral aspect of her knee  Recommended continued exercise program and follow up in the month to see where she is.  She is a patient may need knee replacement surgery although she is having some medical difficulties at this time

## 2010-08-27 ENCOUNTER — Ambulatory Visit (INDEPENDENT_AMBULATORY_CARE_PROVIDER_SITE_OTHER): Payer: Medicare Other | Admitting: Orthopedic Surgery

## 2010-08-27 DIAGNOSIS — M1710 Unilateral primary osteoarthritis, unspecified knee: Secondary | ICD-10-CM

## 2010-08-27 NOTE — Progress Notes (Signed)
Status post LEFT knee arthroscopy and [place] twice status post repeat RIGHT knee arthroscopy here for the first time previous arthroscopy done in [place] as well continues to have anterior knee pain catching locking of the RIGHT knee however, she is a severe respiratory disease patient.  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

## 2010-10-29 ENCOUNTER — Encounter: Payer: Self-pay | Admitting: Orthopedic Surgery

## 2010-10-29 ENCOUNTER — Ambulatory Visit (INDEPENDENT_AMBULATORY_CARE_PROVIDER_SITE_OTHER): Payer: Medicare Other | Admitting: Orthopedic Surgery

## 2010-10-29 VITALS — Ht 62.0 in | Wt 188.0 lb

## 2010-10-29 DIAGNOSIS — M1710 Unilateral primary osteoarthritis, unspecified knee: Secondary | ICD-10-CM

## 2010-10-29 NOTE — Patient Instructions (Signed)
Continue celebrex twice a day

## 2010-10-29 NOTE — Progress Notes (Signed)
Followup visit  Previous history: Status post LEFT knee arthroscopy in [place] twice status post repeat RIGHT knee arthroscopy here for the first time previous arthroscopy done in [place] as well continues to have anterior knee pain catching locking of the RIGHT knee however, she is a severe respiratory disease patient. Has severe COPD on oxygen recently had chronic bronchitis after surgery  High surgical risk  I reviewed her arthroscopy pictures an operative report and she has a large chondral lesion on the lateral femoral condyle along with trochlear arthritis status post 2 meniscal tears and resections not improve after surgery recommended knee replacement surgery but her medical problems are such that it is not advisable at this time  Scheduled for x-rays today   The patient is back on Celebrex twice a day and it seems to be making her knees feel better.  Although she is in chronic pain throughout her body she is accommodated well and is used to it  Bilateral standing knee x-rays separate report She is varus osteoarthritis of the LEFT and valgus on the RIGHT both are severe she also has patellofemoral disease in both knees  Impression bilateral windswept arthritis severe both knees  We have decided to reevaluate her situation in 6 months.

## 2010-11-01 LAB — H. PYLORI ANTIBODY, IGG: H Pylori IgG: <0.4 {ISR}

## 2010-11-07 ENCOUNTER — Ambulatory Visit (INDEPENDENT_AMBULATORY_CARE_PROVIDER_SITE_OTHER): Payer: Medicare Other | Admitting: Otolaryngology

## 2010-11-07 DIAGNOSIS — R490 Dysphonia: Secondary | ICD-10-CM

## 2010-11-07 DIAGNOSIS — K219 Gastro-esophageal reflux disease without esophagitis: Secondary | ICD-10-CM

## 2010-11-21 ENCOUNTER — Encounter: Payer: Self-pay | Admitting: Urgent Care

## 2010-11-21 ENCOUNTER — Ambulatory Visit (INDEPENDENT_AMBULATORY_CARE_PROVIDER_SITE_OTHER): Payer: Medicare Other | Admitting: Urgent Care

## 2010-11-21 DIAGNOSIS — J387 Other diseases of larynx: Secondary | ICD-10-CM

## 2010-11-21 DIAGNOSIS — R1310 Dysphagia, unspecified: Secondary | ICD-10-CM | POA: Insufficient documentation

## 2010-11-21 DIAGNOSIS — K219 Gastro-esophageal reflux disease without esophagitis: Secondary | ICD-10-CM | POA: Insufficient documentation

## 2010-11-21 NOTE — Assessment & Plan Note (Addendum)
Krystal Powell is a 65 y.o. female with chronic complicated GERD/Schatzki's ring who presents with a one-year history of worsening hoarseness and reflux symptoms. She is also having dysphagia. She is going to need EGD with possible esophageal dilation with Dr. Jena Gauss.  I have discussed risks & benefits which include, but are not limited to, bleeding, infection, perforation & drug reaction.  The patient agrees with this plan & written consent will be obtained.  Procedure will need to be done with deep sedation (propofol) in the OR under the direction of anesthesia services for history of difficult sedation. She has done well with propofol previously.   Prilosec 20mg  before breakfast & dinner until procedure We may change your medication after your procedure. Gastroesophageal Reflux Disease, Adult literature given

## 2010-11-21 NOTE — Assessment & Plan Note (Signed)
Possible recurrent Schatzki's ring. Question significance of cork-screwing of esophagus on last EGD.  For EGD with possible ED.

## 2010-11-21 NOTE — Patient Instructions (Addendum)
Prilosec 20mg  before breakfast & dinner until procedure We may change your medication after your procedure.   Gastroesophageal Reflux Disease, Adult Gastroesophageal reflux disease (GERD) happens when acid from your stomach flows up into the esophagus. When acid comes in contact with the esophagus, the acid causes soreness (inflammation) in the esophagus. Over time, GERD may create small holes (ulcers) in the lining of the esophagus. CAUSES   Increased body weight. This puts pressure on the stomach, making acid rise from the stomach into the esophagus.   Smoking. This increases acid production in the stomach.   Drinking alcohol. This causes decreased pressure in the lower esophageal sphincter (valve or ring of muscle between the esophagus and stomach), allowing acid from the stomach into the esophagus.   Late evening meals and a full stomach. This increases pressure and acid production in the stomach.   A malformed lower esophageal sphincter.  Sometimes, no cause is found. SYMPTOMS   Burning pain in the lower part of the mid-chest behind the breastbone and in the mid-stomach area. This may occur twice a week or more often.   Trouble swallowing.   Sore throat.   Dry cough.   Asthma-like symptoms including chest tightness, shortness of breath, or wheezing.  DIAGNOSIS  Your caregiver may be able to diagnose GERD based on your symptoms. In some cases, X-rays and other tests may be done to check for complications or to check the condition of your stomach and esophagus. TREATMENT  Your caregiver may recommend over-the-counter or prescription medicines to help decrease acid production. Ask your caregiver before starting or adding any new medicines.  HOME CARE INSTRUCTIONS   Change the factors that you can control. Ask your caregiver for guidance concerning weight loss, quitting smoking, and alcohol consumption.   Avoid foods and drinks that make your symptoms worse, such as:    Caffeine or alcoholic drinks.   Chocolate.   Peppermint or mint flavorings.   Garlic and onions.   Spicy foods.   Citrus fruits, such as oranges, lemons, or limes.   Tomato-based foods such as sauce, chili, salsa, and pizza.   Fried and fatty foods.   Avoid lying down for the 3 hours prior to your bedtime or prior to taking a nap.   Eat small, frequent meals instead of large meals.   Wear loose-fitting clothing. Do not wear anything tight around your waist that causes pressure on your stomach.   Raise the head of your bed 6 to 8 inches with wood blocks to help you sleep. Extra pillows will not help.   Only take over-the-counter or prescription medicines for pain, discomfort, or fever as directed by your caregiver.   Do not take aspirin, ibuprofen, or other nonsteroidal anti-inflammatory drugs (NSAIDs).  SEEK IMMEDIATE MEDICAL CARE IF:   You have pain in your arms, neck, jaw, teeth, or back.   Your pain increases or changes in intensity or duration.   You develop nausea, vomiting, or sweating (diaphoresis).   You develop shortness of breath, or you faint.   Your vomit is green, yellow, black, or looks like coffee grounds or blood.   Your stool is red, bloody, or black.  These symptoms could be signs of other problems, such as heart disease, gastric bleeding, or esophageal bleeding. MAKE SURE YOU:   Understand these instructions.   Will watch your condition.   Will get help right away if you are not doing well or get worse.  Document Released: 10/23/2004 Document Revised: 09/25/2010 Document  Reviewed: 08/02/2010 [facility] Patient Information 2012 [place], [place].

## 2010-11-21 NOTE — Progress Notes (Signed)
Primary Care Physician:  Dwana Melena, MD Primary Gastroenterologist:  Dr. Jena Gauss  Chief Complaint  Patient presents with  . Gastrophageal Reflux    HPI:  Krystal Powell is a 65 y.o. female here for evaluation of severe acid reflux.  She has history of chronic GERD with Schatzki's ring requiring dilation last in 2009. She also had some focal antral pyloric channel erosions and a hiatal hernia. Interestingly, she had some cork-screwing of the distal esophagus.  She has been to see Dr Christain Sacramento 2 weeks ago and tells me he "ran the light".  She has had hoarseness x 1 year.  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  Recent flares of asthma/chronic bronchitis.  Was on prevacid 30mg  daily.  Changed to BID prevacid 30mg  approximately a couple weeks ago-noticed hoarseness better.  On aciphex 20mg  BID now.  C/o dysphagia & feels like it takes forever for solid foods to go down.  Ate pizza the other day & it set off bad reflux.  +regurg.  Has 3 week supply of prilosec 20mg  BID to take when she runs out off aciphex.  Past Medical History  Diagnosis Date  . COPD (chronic obstructive pulmonary disease)   . Hypertension   . Asthma   . GERD (gastroesophageal reflux disease)   . Hiatal hernia   . S/P colonoscopy 03/02/2008    Dr Rourk->hyperplastic polyp, pancolonic diverticula  . S/P endoscopy 01/25/2008    Dr Rourk-> crk-screwing distal esophagus, Schatzki's ring dilated 12F, pyloric channel erosions  . Schatzki's ring   . Adenomatous polyps     Dr Gwenevere Abbot  . Spondylosis   . Hypercholesteremia   . Anxiety   . Hypothyroidism     Past Surgical History  Procedure Date  . Knee surgery     multiple knee arthroscopies  . Tonsillectomy and adenoidectomy   . Bladder suspension   . Partial hysterectomy   . Appendectomy   . Cholecystectomy   . Umbilical hernia repair   . Laparoscopy   . Carotid endarterectomy   . Sinus exploration   . Shoulder surgery     right  . Foot surgery     right    Current Outpatient  Prescriptions  Medication Sig Dispense Refill  . ALPRAZolam (XANAX) 0.5 MG tablet Take 0.5 mg by mouth at bedtime as needed.        . carvedilol (COREG) 6.25 MG tablet Take 6.25 mg by mouth 2 (two) times daily with a meal.       . CELEBREX 200 MG capsule Take 200 mg by mouth 2 (two) times daily.       . cetirizine (ZYRTEC) 5 MG tablet Take 10 mg by mouth daily.        . DULERA 200-5 MCG/ACT AERO 2 (two) times daily.       . fenofibrate 160 MG tablet Take 160 mg by mouth daily.       . furosemide (LASIX) 20 MG tablet Take 20 mg by mouth daily.       Marland Kitchen HYDROcodone-acetaminophen (NORCO) 7.5-325 MG per tablet Take 1 tablet by mouth every 4 (four) hours as needed.       Marland Kitchen ipratropium-albuterol (DUONEB) 0.5-2.5 (3) MG/3ML SOLN Take 3 mLs by nebulization.        Marland Kitchen levothyroxine (SYNTHROID, LEVOTHROID) 100 MCG tablet Take 100 mcg by mouth daily.       Marland Kitchen olmesartan-hydrochlorothiazide (BENICAR HCT) 20-12.5 MG per tablet Take 1 tablet by mouth daily.       Marland Kitchen  potassium chloride (K-DUR,KLOR-CON) 10 MEQ tablet Take 10 mEq by mouth daily.       . promethazine (PHENERGAN) 25 MG tablet Take 25 mg by mouth every 6 (six) hours as needed.       Marland Kitchen QVAR 40 MCG/ACT inhaler Inhale 1 puff into the lungs 2 (two) times daily.       . RABEprazole (ACIPHEX) 20 MG tablet Take 20 mg by mouth 2 (two) times daily.       . Red Yeast Rice Extract (RED YEAST RICE PO) Take by mouth.        Marland Kitchen SINGULAIR 10 MG tablet Take 10 mg by mouth at bedtime.       Marland Kitchen UNABLE TO FIND Beconace-qa nasal spray use bid          Allergies as of 11/21/2010 - Review Complete 11/21/2010  Allergen Reaction Noted  . Augmentin Other (See Comments) 05/09/2010  . Demeclocycline hcl    . Iodinated diagnostic agents  03/04/2010  . Iodine Hives and Nausea Only   . Iohexol  01/02/2008  . ZOX:[id number]+[id number] acid+aspartame    . Sulfonamide derivatives Nausea And Vomiting   . Thorazine (chlorpromazine hcl)    . Codeine Rash and Other  (See Comments)   . Erythromycin Rash and Other (See Comments)     Family History  Problem Relation Age of Onset  . GER disease Mother   . GER disease Father   . Goiter Mother     malignant    History   Social History  . Marital Status: Married    Spouse Name: N/A    Number of Children: 3  . Years of Education: N/A   Occupational History  . disabled    Social History Main Topics  . Smoking status: Never Smoker   . Smokeless tobacco: Not on file  . Alcohol Use: Not on file  . Drug Use: Not on file  . Sexually Active: Not on file   Other Topics Concern  . Not on file   Social History Narrative  . No narrative on file    Review of Systems: Gen: Denies any fever, chills, sweats, anorexia, fatigue, weakness, malaise, weight loss.  Complains of insomnia CV: Denies chest pain, angina, palpitations, syncope, orthopnea, PND, peripheral edema, and claudication. Resp: Positive for recent cough and shortness of breath with exertion, chronic bronchitis, and asthma. She is using inhalers GI: Denies vomiting blood, jaundice, and fecal incontinence. GU : Denies urinary burning, blood in urine, urinary frequency, urinary hesitancy, nocturnal urination, and urinary incontinence. MS: Chronic joint pain and body aches. Denies muscle weakness, cramps, atrophy.  Derm: Denies rash, itching, dry skin, hives, moles, warts, or unhealing ulcers.  Psych: Denies depression, anxiety, memory loss, suicidal ideation, hallucinations, paranoia, and confusion.  Heme: Denies bruising, bleeding, and enlarged lymph nodes.  Physical Exam: BP 132/78  Pulse 72  Temp(Src) 97.3 F (36.3 C) (Temporal)  Ht 5\' 3"  (1.6 m)  Wt 193 lb 12.8 oz (87.907 kg)  BMI 34.33 kg/m2 General:   Alert,  Well-developed, obese, pleasant and cooperative in NAD. Head:  Normocephalic and atraumatic. Eyes:  Sclera clear, no icterus.   Conjunctiva pink. Ears:  Normal auditory acuity. Nose:  No deformity, discharge,  or  lesions. Mouth:  No deformity or lesions, oropharynx pink and moist. Neck:  Supple; no masses or thyromegaly. Lungs:  Clear throughout to auscultation.   No wheezes, crackles, or rhonchi. No acute distress. Heart:  Regular rate and rhythm; no murmurs, clicks, rubs,  or gallops. Abdomen:  Soft, obese, nontender and nondistended. No masses, hepatosplenomegaly or hernias noted. Normal bowel sounds, without guarding, and without rebound.   Rectal:  Deferred. Msk:  Symmetrical without gross deformities. Normal posture. Pulses:  Normal pulses noted. Extremities:  Without clubbing or edema. Neurologic:  Alert and  oriented x4;  grossly normal neurologically. Skin:  Intact without significant lesions or rashes. Cervical Nodes:  No significant cervical adenopathy. Psych:  Alert and cooperative. Normal mood and affect.

## 2010-11-22 NOTE — Progress Notes (Signed)
Cc to PCP 

## 2010-11-25 ENCOUNTER — Other Ambulatory Visit: Payer: Self-pay | Admitting: Gastroenterology

## 2010-12-05 ENCOUNTER — Ambulatory Visit (INDEPENDENT_AMBULATORY_CARE_PROVIDER_SITE_OTHER): Payer: Medicare Other | Admitting: Otolaryngology

## 2010-12-05 DIAGNOSIS — R490 Dysphonia: Secondary | ICD-10-CM

## 2010-12-09 ENCOUNTER — Encounter (HOSPITAL_COMMUNITY): Payer: Self-pay | Admitting: Pharmacy Technician

## 2010-12-12 ENCOUNTER — Encounter (HOSPITAL_COMMUNITY): Payer: Self-pay

## 2010-12-12 ENCOUNTER — Other Ambulatory Visit: Payer: Self-pay

## 2010-12-12 ENCOUNTER — Encounter (HOSPITAL_COMMUNITY)
Admission: RE | Admit: 2010-12-12 | Discharge: 2010-12-12 | Disposition: A | Discharge status: Home / Self Care | Payer: Medicare Other | Source: Ambulatory Visit | Attending: Internal Medicine | Admitting: Internal Medicine

## 2010-12-12 HISTORY — DX: Dorsalgia, unspecified: M54.9

## 2010-12-12 HISTORY — DX: Other chronic pain: G89.29

## 2010-12-12 HISTORY — DX: Other specified postprocedural states: Z98.890

## 2010-12-12 HISTORY — DX: Reserved for concepts with insufficient information to code with codable children: IMO0002

## 2010-12-12 HISTORY — DX: Other specified postprocedural states: R11.2

## 2010-12-12 LAB — CBC
HCT: 37.7 % (ref 36.0–46.0)
Hemoglobin: 12.6 g/dL (ref 12.0–15.0)
MCV: 91.1 fL (ref 78.0–100.0)
RDW: 12.7 % (ref 11.5–15.5)
WBC: 8.9 10*3/uL (ref 4.0–10.5)

## 2010-12-12 LAB — BASIC METABOLIC PANEL
BUN: 41 mg/dL — ABNORMAL HIGH (ref 6–23)
Chloride: 100 mEq/L (ref 96–112)
Creatinine, Ser: 2.13 mg/dL — ABNORMAL HIGH (ref 0.50–1.10)
GFR calc Af Amer: 27 mL/min — ABNORMAL LOW (ref >90)
Glucose, Bld: 114 mg/dL — ABNORMAL HIGH (ref 70–99)

## 2010-12-12 NOTE — Patient Instructions (Addendum)
20 Krystal Powell  12/12/2010   Your procedure is scheduled on:  12/18/2010  Report to [facility] at  615  AM.  Call this number if you have problems the morning of surgery: [phone]   Remember:   Do not eat food:After Midnight.  Do not drink clear liquids: After Midnight.  Take these medicines the morning of surgery with A SIP OF WATER:  Xanax,coreg,zyrtec,norco,synthroid,antivert,benicar,prilosec,phenergan,zantac,singulair,medrol. Take neb  Before you come as well as symbiocort and albuterol inhalers.  Do not wear jewelry, make-up or nail polish.  Do not wear lotions, powders, or perfumes. You may wear deodorant.  Do not shave 48 hours prior to surgery.  Do not bring valuables to the hospital.  Contacts, dentures or bridgework may not be worn into surgery.  Leave suitcase in the car. After surgery it may be brought to your room.  For patients admitted to the hospital, checkout time is 11:00 AM the day of discharge.   Patients discharged the day of surgery will not be allowed to drive home.  Name and phone number of your driver: none  Special Instructions: N/A   Please read over the following fact sheets that you were given: Pain Booklet, Surgical Site Infection Prevention, Anesthesia Post-op Instructions and Care and Recovery After Surgery Esophagogastroduodenoscopy This is an endoscopic procedure (a procedure that uses a device like a flexible telescope) that allows your caregiver to view the upper stomach and small bowel. This test allows your caregiver to look at the esophagus. The esophagus carries food from your mouth to your stomach. They can also look at your duodenum. This is the first part of the small intestine that attaches to the stomach. This test is used to detect problems in the bowel such as ulcers and inflammation. PREPARATION FOR TEST Nothing to eat after midnight the day before the test. NORMAL FINDINGS Normal esophagus, stomach, and duodenum. Ranges for normal  findings may vary among different laboratories and hospitals. You should always check with your doctor after having lab work or other tests done to discuss the meaning of your test results and whether your values are considered within normal limits. MEANING OF TEST  Your caregiver will go over the test results with you and discuss the importance and meaning of your results, as well as treatment options and the need for additional tests if necessary. OBTAINING THE TEST RESULTS It is your responsibility to obtain your test results. Ask the lab or department performing the test when and how you will get your results. Document Released: 05/16/2004 Document Revised: 09/25/2010 Document Reviewed: 12/24/2007 [facility] Patient Information 2012 [place], [place].PATIENT INSTRUCTIONS POST-ANESTHESIA  IMMEDIATELY FOLLOWING SURGERY:  Do not drive or operate machinery for the first twenty four hours after surgery.  Do not make any important decisions for twenty four hours after surgery or while taking narcotic pain medications or sedatives.  If you develop intractable nausea and vomiting or a severe headache please notify your doctor immediately.  FOLLOW-UP:  Please make an appointment with your surgeon as instructed. You do not need to follow up with anesthesia unless specifically instructed to do so.  WOUND CARE INSTRUCTIONS (if applicable):  Keep a dry clean dressing on the anesthesia/puncture wound site if there is drainage.  Once the wound has quit draining you may leave it open to air.  Generally you should leave the bandage intact for twenty four hours unless there is drainage.  If the epidural site drains for more than 36-48 hours please call the anesthesia  department.  QUESTIONS?:  Please feel free to call your physician or the hospital operator if you have any questions, and they will be happy to assist you.     [facility] Anesthesia Department [address] [place] [phone]

## 2010-12-13 NOTE — Progress Notes (Signed)
Dr. Tollie Eth has reviewed pt's lab work, states pt is "OK" for ordered procedure.

## 2010-12-16 NOTE — Telephone Encounter (Signed)
No Further notes, this would have been addressed at time of note.

## 2010-12-16 NOTE — Progress Notes (Signed)
Quick Note:  Discussed rising creatinine w/ pt. Reviewed all labs. She will call Dr. Hughie Closs today to discuss this & management of her diuretics. Cc:Dr. Margo Aye Thanks ______

## 2010-12-17 NOTE — Progress Notes (Signed)
Results Cc to PCP  

## 2010-12-18 ENCOUNTER — Encounter (HOSPITAL_COMMUNITY): Payer: Self-pay | Admitting: *Deleted

## 2010-12-18 ENCOUNTER — Ambulatory Visit (HOSPITAL_COMMUNITY): Payer: Medicare Other | Admitting: Anesthesiology

## 2010-12-18 ENCOUNTER — Ambulatory Visit (HOSPITAL_COMMUNITY)
Admission: RE | Admit: 2010-12-18 | Discharge: 2010-12-18 | Disposition: A | Discharge status: Home / Self Care | Payer: Medicare Other | Source: Ambulatory Visit | Attending: Internal Medicine | Admitting: Internal Medicine

## 2010-12-18 ENCOUNTER — Other Ambulatory Visit: Payer: Self-pay | Admitting: Internal Medicine

## 2010-12-18 ENCOUNTER — Encounter (HOSPITAL_COMMUNITY)
Admission: RE | Disposition: A | Discharge status: Home / Self Care | Payer: Self-pay | Source: Ambulatory Visit | Attending: Internal Medicine

## 2010-12-18 ENCOUNTER — Encounter (HOSPITAL_COMMUNITY): Payer: Self-pay | Admitting: Anesthesiology

## 2010-12-18 DIAGNOSIS — J449 Chronic obstructive pulmonary disease, unspecified: Secondary | ICD-10-CM | POA: Insufficient documentation

## 2010-12-18 DIAGNOSIS — D131 Benign neoplasm of stomach: Secondary | ICD-10-CM

## 2010-12-18 DIAGNOSIS — K222 Esophageal obstruction: Secondary | ICD-10-CM

## 2010-12-18 DIAGNOSIS — K449 Diaphragmatic hernia without obstruction or gangrene: Secondary | ICD-10-CM | POA: Insufficient documentation

## 2010-12-18 DIAGNOSIS — R490 Dysphonia: Secondary | ICD-10-CM | POA: Insufficient documentation

## 2010-12-18 DIAGNOSIS — Z01810 Encounter for preprocedural cardiovascular examination: Secondary | ICD-10-CM | POA: Insufficient documentation

## 2010-12-18 DIAGNOSIS — R1310 Dysphagia, unspecified: Secondary | ICD-10-CM | POA: Insufficient documentation

## 2010-12-18 DIAGNOSIS — K219 Gastro-esophageal reflux disease without esophagitis: Secondary | ICD-10-CM | POA: Insufficient documentation

## 2010-12-18 DIAGNOSIS — E7800 Pure hypercholesterolemia, unspecified: Secondary | ICD-10-CM | POA: Insufficient documentation

## 2010-12-18 DIAGNOSIS — Z01812 Encounter for preprocedural laboratory examination: Secondary | ICD-10-CM | POA: Insufficient documentation

## 2010-12-18 DIAGNOSIS — Z79899 Other long term (current) drug therapy: Secondary | ICD-10-CM | POA: Insufficient documentation

## 2010-12-18 DIAGNOSIS — J4489 Other specified chronic obstructive pulmonary disease: Secondary | ICD-10-CM | POA: Insufficient documentation

## 2010-12-18 DIAGNOSIS — I10 Essential (primary) hypertension: Secondary | ICD-10-CM | POA: Insufficient documentation

## 2010-12-18 HISTORY — PX: MALONEY DILATION: SHX5535

## 2010-12-18 LAB — GLUCOSE, CAPILLARY: Glucose-Capillary: 111 mg/dL — ABNORMAL HIGH (ref 70–99)

## 2010-12-18 SURGERY — ESOPHAGOGASTRODUODENOSCOPY (EGD) WITH PROPOFOL
Anesthesia: Monitor Anesthesia Care

## 2010-12-18 MED ORDER — ONDANSETRON HCL 4 MG/2ML IJ SOLN
INTRAMUSCULAR | Status: AC
  Administered 2010-12-18: 4 mg via INTRAVENOUS
  Filled 2010-12-18: qty 2

## 2010-12-18 MED ORDER — GLYCOPYRROLATE 0.2 MG/ML IJ SOLN
INTRAMUSCULAR | Status: AC
  Filled 2010-12-18: qty 1

## 2010-12-18 MED ORDER — DEXAMETHASONE SODIUM PHOSPHATE 4 MG/ML IJ SOLN
4.0000 mg | Freq: Once | INTRAMUSCULAR | Status: AC
  Administered 2010-12-18: 4 mg via INTRAVENOUS

## 2010-12-18 MED ORDER — ONDANSETRON HCL 4 MG/2ML IJ SOLN
4.0000 mg | Freq: Once | INTRAMUSCULAR | Status: AC
  Administered 2010-12-18: 4 mg via INTRAVENOUS

## 2010-12-18 MED ORDER — FENTANYL CITRATE 0.05 MG/ML IJ SOLN
INTRAMUSCULAR | Status: DC | PRN
  Administered 2010-12-18: 50 ug via INTRAVENOUS

## 2010-12-18 MED ORDER — STERILE WATER FOR IRRIGATION IR SOLN
Status: DC | PRN
  Administered 2010-12-18: 1000 mL

## 2010-12-18 MED ORDER — PROPOFOL 10 MG/ML IV EMUL
INTRAVENOUS | Status: DC | PRN
  Administered 2010-12-18: 75 ug/kg/min via INTRAVENOUS

## 2010-12-18 MED ORDER — SCOPOLAMINE 1 MG/3DAYS TD PT72
1.0000 | MEDICATED_PATCH | Freq: Once | TRANSDERMAL | Status: DC
  Administered 2010-12-18: 1.5 mg via TRANSDERMAL

## 2010-12-18 MED ORDER — FENTANYL CITRATE 0.05 MG/ML IJ SOLN
INTRAMUSCULAR | Status: AC
  Filled 2010-12-18: qty 2

## 2010-12-18 MED ORDER — SCOPOLAMINE 1 MG/3DAYS TD PT72
MEDICATED_PATCH | TRANSDERMAL | Status: AC
  Filled 2010-12-18: qty 1

## 2010-12-18 MED ORDER — FENTANYL CITRATE 0.05 MG/ML IJ SOLN: 25.0000 ug | INTRAMUSCULAR | Status: DC | PRN

## 2010-12-18 MED ORDER — MIDAZOLAM HCL 2 MG/2ML IJ SOLN
INTRAMUSCULAR | Status: AC
  Administered 2010-12-18: 2 mg via INTRAVENOUS
  Filled 2010-12-18: qty 2

## 2010-12-18 MED ORDER — DEXAMETHASONE SODIUM PHOSPHATE 4 MG/ML IJ SOLN
INTRAMUSCULAR | Status: AC
  Administered 2010-12-18: 4 mg via INTRAVENOUS
  Filled 2010-12-18: qty 1

## 2010-12-18 MED ORDER — ONDANSETRON HCL 4 MG/2ML IJ SOLN: 4.0000 mg | Freq: Once | INTRAMUSCULAR | Status: DC | PRN

## 2010-12-18 MED ORDER — PROPOFOL 10 MG/ML IV EMUL
INTRAVENOUS | Status: AC
  Filled 2010-12-18: qty 20

## 2010-12-18 MED ORDER — LACTATED RINGERS IV SOLN
INTRAVENOUS | Status: DC
  Administered 2010-12-18: 08:00:00 via INTRAVENOUS

## 2010-12-18 MED ORDER — STERILE WATER FOR IRRIGATION IR SOLN
Status: DC | PRN
  Administered 2010-12-18: 08:00:00

## 2010-12-18 MED ORDER — MIDAZOLAM HCL 2 MG/2ML IJ SOLN
1.0000 mg | INTRAMUSCULAR | Status: DC | PRN
  Administered 2010-12-18: 2 mg via INTRAVENOUS

## 2010-12-18 MED ORDER — BUTAMBEN-TETRACAINE-BENZOCAINE 2-2-14 % EX AERO
1.0000 | INHALATION_SPRAY | Freq: Once | CUTANEOUS | Status: AC
  Administered 2010-12-18: 2 via TOPICAL
  Filled 2010-12-18: qty 56

## 2010-12-18 MED ORDER — PROPOFOL 10 MG/ML IV EMUL
INTRAVENOUS | Status: DC | PRN
  Administered 2010-12-18: 20 mg via INTRAVENOUS

## 2010-12-18 MED ORDER — LIDOCAINE HCL (PF) 1 % IJ SOLN
INTRAMUSCULAR | Status: AC
  Filled 2010-12-18: qty 5

## 2010-12-18 SURGICAL SUPPLY — 19 items
BLOCK BITE 60FR ADLT L/F BLUE (MISCELLANEOUS) ×2 IMPLANT
DEVICE CLIP HEMOSTAT 235CM (CLIP) IMPLANT
ELECT REM PT RETURN 9FT ADLT (ELECTROSURGICAL)
ELECTRODE REM PT RTRN 9FT ADLT (ELECTROSURGICAL) IMPLANT
FLOOR PAD 36X40 (MISCELLANEOUS) ×2
FORCEP RJ3 GP 1.8X160 W-NEEDLE (CUTTING FORCEPS) IMPLANT
FORCEPS BIOP RAD 4 LRG CAP 4 (CUTTING FORCEPS) ×2 IMPLANT
MANIFOLD NEPTUNE WASTE (CANNULA) ×2 IMPLANT
NDL SCLEROTHERAPY 25GX240 (NEEDLE) ×1 IMPLANT
NEEDLE SCLEROTHERAPY 25GX240 (NEEDLE) ×2 IMPLANT
PAD FLOOR 36X40 (MISCELLANEOUS) ×1 IMPLANT
PROBE APC STR FIRE (PROBE) ×1 IMPLANT
PROBE INJECTION GOLD (MISCELLANEOUS)
PROBE INJECTION GOLD 7FR (MISCELLANEOUS) ×1 IMPLANT
SNARE ROTATE MED OVAL 20MM (MISCELLANEOUS) ×2 IMPLANT
SYR 50ML LL SCALE MARK (SYRINGE) ×1 IMPLANT
TUBING ENDO SMARTCAP PENTAX (MISCELLANEOUS) ×2 IMPLANT
TUBING IRRIGATION ENDOGATOR (MISCELLANEOUS) ×2 IMPLANT
WATER STERILE IRR 1000ML POUR (IV SOLUTION) ×2 IMPLANT

## 2010-12-18 NOTE — H&P (Signed)
  I have seen & examined the patient prior to the procedure(s) today and reviewed the history and physical/consultation.  There have been no changes.  After consideration of the risks, benefits, alternatives and imponderables, the patient has consented to the procedure(s).   

## 2010-12-18 NOTE — Anesthesia Preprocedure Evaluation (Signed)
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 on medications Regular Normal    Neuro/Psych    GI/Hepatic hiatal hernia, GERD-  Medicated and Poorly Controlled,  Endo/Other  Diabetes mellitus-, Well Controlled, Type 2Hypothyroidism   Renal/GU      Musculoskeletal   Abdominal   Peds  Hematology   Anesthesia Other Findings   Reproductive/Obstetrics                           Anesthesia Physical Anesthesia Plan  ASA: III  Anesthesia Plan: MAC   Post-op Pain Management:    Induction: Intravenous  Airway Management Planned: Simple Face Mask  Additional Equipment:   Intra-op Plan:   Post-operative Plan:   Informed Consent: I have reviewed the patients History and Physical, chart, labs and discussed the procedure including the risks, benefits and alternatives for the proposed anesthesia with the patient or authorized representative who has indicated his/her understanding and acceptance.     Plan Discussed with:   Anesthesia Plan Comments:         Anesthesia Quick Evaluation

## 2010-12-18 NOTE — Anesthesia Postprocedure Evaluation (Signed)
  Anesthesia Post-op Note  Patient: Krystal Powell  Procedure(s) Performed:  ESOPHAGOGASTRODUODENOSCOPY (EGD) WITH PROPOFOL - 7:30; MALONEY DILATION - #56 dilator  Patient Location: PACU  Anesthesia Type: MAC  Level of Consciousness: awake, alert  and oriented  Airway and Oxygen Therapy: Patient Spontanous Breathing  Post-op Pain: none  Post-op Assessment: Post-op Vital signs reviewed, Patient's Cardiovascular Status Stable, Respiratory Function Stable and No signs of Nausea or vomiting  Post-op Vital Signs: Reviewed and stable  Complications: No apparent anesthesia complications

## 2010-12-18 NOTE — Transfer of Care (Signed)
Immediate Anesthesia Transfer of Care Note  Patient: Krystal Powell  Procedure(s) Performed:  ESOPHAGOGASTRODUODENOSCOPY (EGD) WITH PROPOFOL - 7:30; MALONEY DILATION - #56 dilator  Patient Location: PACU  Anesthesia Type: MAC  Level of Consciousness: awake, alert  and oriented  Airway & Oxygen Therapy: Patient Spontanous Breathing  Post-op Assessment: Report given to PACU RN  Post vital signs: Reviewed and stable  Complications: No apparent anesthesia complications

## 2010-12-21 ENCOUNTER — Emergency Department (HOSPITAL_COMMUNITY): Payer: Medicare Other

## 2010-12-21 ENCOUNTER — Emergency Department (HOSPITAL_COMMUNITY)
Admission: EM | Admit: 2010-12-21 | Discharge: 2010-12-21 | Disposition: A | Discharge status: Home / Self Care | Payer: Medicare Other | Attending: Emergency Medicine | Admitting: Emergency Medicine

## 2010-12-21 ENCOUNTER — Encounter (HOSPITAL_COMMUNITY): Payer: Self-pay | Admitting: *Deleted

## 2010-12-21 DIAGNOSIS — S39012A Strain of muscle, fascia and tendon of lower back, initial encounter: Secondary | ICD-10-CM

## 2010-12-21 DIAGNOSIS — K5900 Constipation, unspecified: Secondary | ICD-10-CM | POA: Insufficient documentation

## 2010-12-21 DIAGNOSIS — E039 Hypothyroidism, unspecified: Secondary | ICD-10-CM | POA: Insufficient documentation

## 2010-12-21 DIAGNOSIS — E7800 Pure hypercholesterolemia, unspecified: Secondary | ICD-10-CM | POA: Insufficient documentation

## 2010-12-21 DIAGNOSIS — F411 Generalized anxiety disorder: Secondary | ICD-10-CM | POA: Insufficient documentation

## 2010-12-21 DIAGNOSIS — Z79899 Other long term (current) drug therapy: Secondary | ICD-10-CM | POA: Insufficient documentation

## 2010-12-21 DIAGNOSIS — K449 Diaphragmatic hernia without obstruction or gangrene: Secondary | ICD-10-CM | POA: Insufficient documentation

## 2010-12-21 DIAGNOSIS — I10 Essential (primary) hypertension: Secondary | ICD-10-CM | POA: Insufficient documentation

## 2010-12-21 DIAGNOSIS — R109 Unspecified abdominal pain: Secondary | ICD-10-CM | POA: Insufficient documentation

## 2010-12-21 DIAGNOSIS — J45909 Unspecified asthma, uncomplicated: Secondary | ICD-10-CM | POA: Insufficient documentation

## 2010-12-21 DIAGNOSIS — X58XXXA Exposure to other specified factors, initial encounter: Secondary | ICD-10-CM | POA: Insufficient documentation

## 2010-12-21 DIAGNOSIS — IMO0002 Reserved for concepts with insufficient information to code with codable children: Secondary | ICD-10-CM | POA: Insufficient documentation

## 2010-12-21 DIAGNOSIS — K219 Gastro-esophageal reflux disease without esophagitis: Secondary | ICD-10-CM | POA: Insufficient documentation

## 2010-12-21 DIAGNOSIS — M7989 Other specified soft tissue disorders: Secondary | ICD-10-CM | POA: Insufficient documentation

## 2010-12-21 LAB — DIFFERENTIAL
Basophils Absolute: 0.1 10*3/uL (ref 0.0–0.1)
Basophils Relative: 1 % (ref 0–1)
Lymphocytes Relative: 37 % (ref 12–46)
Monocytes Relative: 11 % (ref 3–12)
Neutro Abs: 3.9 10*3/uL (ref 1.7–7.7)
Neutrophils Relative %: 50 % (ref 43–77)

## 2010-12-21 LAB — COMPREHENSIVE METABOLIC PANEL
AST: 19 U/L (ref 0–37)
Albumin: 3.1 g/dL — ABNORMAL LOW (ref 3.5–5.2)
Alkaline Phosphatase: 32 U/L — ABNORMAL LOW (ref 39–117)
BUN: 34 mg/dL — ABNORMAL HIGH (ref 6–23)
Chloride: 102 mEq/L (ref 96–112)
GFR calc Af Amer: 32 mL/min — ABNORMAL LOW (ref >90)
GFR calc non Af Amer: 27 mL/min — ABNORMAL LOW (ref >90)
Potassium: 3.5 mEq/L (ref 3.5–5.1)
Total Bilirubin: 0.3 mg/dL (ref 0.3–1.2)

## 2010-12-21 LAB — URINALYSIS, ROUTINE W REFLEX MICROSCOPIC
Leukocytes, UA: NEGATIVE
Nitrite: NEGATIVE
Protein, ur: NEGATIVE mg/dL
Specific Gravity, Urine: <1.005 — ABNORMAL LOW (ref 1.005–1.030)
Urobilinogen, UA: 0.2 mg/dL (ref 0.0–1.0)

## 2010-12-21 LAB — CBC
Hemoglobin: 11.7 g/dL — ABNORMAL LOW (ref 12.0–15.0)
MCHC: 34 g/dL (ref 30.0–36.0)
WBC: 7.7 10*3/uL (ref 4.0–10.5)

## 2010-12-21 LAB — LIPASE, BLOOD: Lipase: 46 U/L (ref 11–59)

## 2010-12-21 MED ORDER — MORPHINE SULFATE 4 MG/ML IJ SOLN
4.0000 mg | Freq: Once | INTRAMUSCULAR | Status: AC
  Administered 2010-12-21: 4 mg via INTRAVENOUS
  Filled 2010-12-21: qty 1

## 2010-12-21 MED ORDER — CYCLOBENZAPRINE HCL 10 MG PO TABS: 10.0000 mg | ORAL_TABLET | Freq: Two times a day (BID) | ORAL | Status: AC | PRN

## 2010-12-21 MED ORDER — ONDANSETRON HCL 4 MG/2ML IJ SOLN
4.0000 mg | Freq: Once | INTRAMUSCULAR | Status: AC
  Administered 2010-12-21: 4 mg via INTRAVENOUS
  Filled 2010-12-21: qty 2

## 2010-12-21 NOTE — ED Provider Notes (Signed)
History  Scribed for Rolan Bucco, MD, the patient was seen in room APA12. This chart was scribed by Hillery Hunter. The patient's care started at 1:59 PM     CSN: [id number] Arrival date & time: 12/21/2010  1:31 PM   First MD Initiated Contact with Patient 12/21/10 1348      Chief Complaint  Patient presents with  . Flank Pain     The history is provided by the patient.    Krystal Powell is a 65 y.o. female who presents to the Emergency Department complaining of pain in the right flank that started yesterday and has gradually been getting worse.  She describes the pain as sharp and states that it comes and goes starting on the right flank and going around to abdomen making her feel nauseous.  She is experiencing no vomiting, diarrhea, fever, headaches, problems urinating, or rhinorrhea.  Eating does not make it worse.  She states she has a h/o acid reflux and had an endoscopy on 12/18/10.  She has no h/o of kidney stones and has had several surgeries including bladder suspension and cholecystectomy.     Past Medical History  Diagnosis Date  . COPD (chronic obstructive pulmonary disease)   . Hypertension   . Asthma   . GERD (gastroesophageal reflux disease)   . Hiatal hernia   . S/P colonoscopy 03/02/2008    Dr Rourk->hyperplastic polyp, pancolonic diverticula  . S/P endoscopy 01/25/2008    Dr Rourk-> crk-screwing distal esophagus, Schatzki's ring dilated 45F, pyloric channel erosions  . Schatzki's ring   . Adenomatous polyps     Dr Gwenevere Abbot  . Spondylosis   . Hypercholesteremia   . Anxiety   . Hypothyroidism   . PONV (postoperative nausea and vomiting)   . Degenerative disc disease   . Chronic back pain     Past Surgical History  Procedure Date  . Knee surgery     multiple knee arthroscopies  . Tonsillectomy and adenoidectomy   . Bladder suspension   . Partial hysterectomy   . Appendectomy   . Cholecystectomy   . Umbilical hernia repair   .  Laparoscopy   . Carotid endarterectomy     right  . Sinus exploration   . Shoulder surgery     right  . Foot surgery     right    Family History  Problem Relation Age of Onset  . GER disease Mother   . Goiter Mother     malignant  . GER disease Father   . Anesthesia problems Neg Hx   . Hypotension Neg Hx   . Malignant hyperthermia Neg Hx   . Pseudochol deficiency Neg Hx     History  Substance Use Topics  . Smoking status: Never Smoker   . Smokeless tobacco: Not on file  . Alcohol Use: No    OB History    Grav Para Term Preterm Abortions TAB SAB Ect Mult Living                  Review of Systems  Constitutional: Negative for fever.  HENT: Negative for congestion and rhinorrhea.   Respiratory: Negative for cough and shortness of breath.   Cardiovascular: Positive for leg swelling (at baseline per patient). Negative for chest pain.  Gastrointestinal: Positive for abdominal pain (right lower quadrant abdominal pain). Negative for nausea, vomiting and diarrhea.  Genitourinary: Negative for difficulty urinating and dyspareunia.  Musculoskeletal: Positive for back pain (right flank pain).  Skin: Negative for  wound.  Psychiatric/Behavioral: Negative for behavioral problems and confusion.  all other systems reviewed and negative.  Allergies  Adhesive; Augmentin; Demeclocycline hcl; Iodinated diagnostic agents; Iodine; Iohexol; ZOX:[id number] acid+aspartame; Moxifloxacin; Sulfonamide derivatives; Thorazine; Codeine; and Erythromycin  Home Medications   Current Outpatient Rx  Name Route Sig Dispense Refill  . ACETAMINOPHEN 500 MG PO TABS Oral Take 1,000 mg by mouth 2 (two) times daily.      . ALBUTEROL SULFATE HFA 108 (90 BASE) MCG/ACT IN AERS Inhalation Inhale 2 puffs into the lungs every 4 (four) hours as needed. For shortness of breath      . ALPRAZOLAM 0.5 MG PO TABS Oral Take 0.5 mg by mouth at bedtime.     . BECLOMETHASONE DIPROP MONOHYD 42  MCG/SPRAY NA SUSP Nasal Place 2 sprays into the nose 2 (two) times daily. Dose is for each nostril.     . ESTER-C 1000-50 MG PO TABS Oral Take by mouth.      . BUDESONIDE-FORMOTEROL FUMARATE 160-4.5 MCG/ACT IN AERO Inhalation Inhale 2 puffs into the lungs 2 (two) times daily.      Marland Kitchen CALCIUM CARBONATE-VITAMIN D 600-400 MG-UNIT PO TABS Oral Take 1 tablet by mouth every morning.      Marland Kitchen CARVEDILOL 6.25 MG PO TABS Oral Take 6.25 mg by mouth 2 (two) times daily with a meal.     . CELEBREX 200 MG PO CAPS Oral Take 200 mg by mouth 2 (two) times daily.     Marland Kitchen CETIRIZINE HCL 10 MG PO TABS Oral Take 10 mg by mouth at bedtime.      Marland Kitchen VITAMIN D 2000 UNITS PO TABS Oral Take 2,000 Units by mouth every morning.      Marland Kitchen CYANOCOBALAMIN 2500 MCG PO TABS Oral Take 1 tablet by mouth every morning.      . CYCLOBENZAPRINE HCL 10 MG PO TABS Oral Take 1 tablet (10 mg total) by mouth 2 (two) times daily as needed for muscle spasms. 20 tablet 0  . ECHINACEA 350 MG PO CAPS Oral Take 1 capsule by mouth every morning.      . FENOFIBRATE 160 MG PO TABS Oral Take 160 mg by mouth daily after breakfast.     . OMEGA-3 FATTY ACIDS 1000 MG PO CAPS Oral Take 1 g by mouth every morning.      . FUROSEMIDE 20 MG PO TABS Oral Take 20 mg by mouth daily after breakfast.     . HYDROCODONE-ACETAMINOPHEN 7.5-325 MG PO TABS Oral Take 1 tablet by mouth every 4 (four) hours as needed. For pain     . IPRATROPIUM-ALBUTEROL 0.5-2.5 (3) MG/3ML IN SOLN Nebulization Take 3 mLs by nebulization 4 (four) times daily as needed. For shortness of breath    . LEVOFLOXACIN 500 MG PO TABS Oral Take 500 mg by mouth daily. Started on 12-05-10 For 10 days     . LEVOTHYROXINE SODIUM 100 MCG PO TABS Oral Take 100 mcg by mouth every morning.     Marland Kitchen MECLIZINE HCL 25 MG PO TABS Oral Take 25 mg by mouth 3 (three) times daily as needed. For dizziness     . METHYLPREDNISOLONE 4 MG PO TABS Oral Take 4 mg by mouth as directed. Taper down, starting from 6 tablets on 12-06-10       . OLMESARTAN MEDOXOMIL-HCTZ 40-12.5 MG PO TABS Oral Take 0.5 tablets by mouth daily. Take 1/2 tablet once daily      . POTASSIUM CHLORIDE CRYS CR 10 MEQ PO TBCR Oral Take  10 mEq by mouth daily.     Marland Kitchen PRESCRIPTION MEDICATION Oral Take 5 mLs by mouth every 6 (six) hours as needed. [facility] compound  Of hydrocodone 5 mg/ 5 mL, phenylephrine 10 mg/  5 mL, chlorphenamine 4mg  / 5 mL with honey     . PROMETHAZINE HCL 25 MG PO TABS Oral Take 25 mg by mouth every 6 (six) hours as needed.     Marland Kitchen QVAR 40 MCG/ACT IN AERS Inhalation Inhale 1 puff into the lungs daily as needed. For shortness of breath    . RANITIDINE HCL 75 MG PO TABS Oral Take 75 mg by mouth at bedtime.      . RED YEAST RICE 600 MG PO CAPS Oral Take 1 capsule by mouth every morning.      Marland Kitchen SINGULAIR 10 MG PO TABS Oral Take 10 mg by mouth at bedtime.     Marland Kitchen VITAMIN E 1000 UNITS PO CAPS Oral Take 1,000 Units by mouth every morning.        BP 123/64  Pulse 83  Temp(Src) 97.2 F (36.2 C) (Oral)  Resp 18  SpO2 97%  Physical Exam  Nursing note and vitals reviewed. Constitutional: She is oriented to person, place, and time. She appears well-developed and well-nourished.  HENT:  Head: Normocephalic and atraumatic.  Eyes: Pupils are equal, round, and reactive to light.  Neck: Normal range of motion. Neck supple.  Cardiovascular: Normal rate, regular rhythm and normal heart sounds.   Pulmonary/Chest: Effort normal and breath sounds normal. No respiratory distress. She exhibits no tenderness.  Abdominal: There is tenderness (mild tenderness of left abdomen). There is CVA tenderness (right CVA tenderness ).       Positive right CVA tenderness   Musculoskeletal: Normal range of motion. She exhibits edema (similar to baseline).  Lymphadenopathy:    She has no cervical adenopathy.  Neurological: She is alert and oriented to person, place, and time.  Skin: Skin is warm and dry. No rash noted.  Psychiatric: She has a normal mood  and affect. Her behavior is normal.  error: tenderness is to right mid abdomen  ED Course  Procedures   Results for orders placed during the hospital encounter of 12/21/10  CBC      Component Value Range   WBC 7.7  4.0 - 10.5 (K/uL)   RBC 3.76 (*) 3.87 - 5.11 (MIL/uL)   Hemoglobin 11.7 (*) 12.0 - 15.0 (g/dL)   HCT 16.1 (*) 09.6 - 46.0 (%)   MCV 91.5  78.0 - 100.0 (fL)   MCH 31.1  26.0 - 34.0 (pg)   MCHC 34.0  30.0 - 36.0 (g/dL)   RDW 04.5  40.9 - 81.1 (%)   Platelets 219  150 - 400 (K/uL)  DIFFERENTIAL      Component Value Range   Neutrophils Relative 50  43 - 77 (%)   Neutro Abs 3.9  1.7 - 7.7 (K/uL)   Lymphocytes Relative 37  12 - 46 (%)   Lymphs Abs 2.8  0.7 - 4.0 (K/uL)   Monocytes Relative 11  3 - 12 (%)   Monocytes Absolute 0.8  0.1 - 1.0 (K/uL)   Eosinophils Relative 2  0 - 5 (%)   Eosinophils Absolute 0.1  0.0 - 0.7 (K/uL)   Basophils Relative 1  0 - 1 (%)   Basophils Absolute 0.1  0.0 - 0.1 (K/uL)  COMPREHENSIVE METABOLIC PANEL      Component Value Range   Sodium 141  135 - 145 (mEq/L)   Potassium 3.5  3.5 - 5.1 (mEq/L)   Chloride 102  96 - 112 (mEq/L)   CO2 32  19 - 32 (mEq/L)   Glucose, Bld 123 (*) 70 - 99 (mg/dL)   BUN 34 (*) 6 - 23 (mg/dL)   Creatinine, Ser 1.61 (*) 0.50 - 1.10 (mg/dL)   Calcium 09.6  8.4 - 10.5 (mg/dL)   Total Protein 6.2  6.0 - 8.3 (g/dL)   Albumin 3.1 (*) 3.5 - 5.2 (g/dL)   AST 19  0 - 37 (U/L)   ALT 14  0 - 35 (U/L)   Alkaline Phosphatase 32 (*) 39 - 117 (U/L)   Total Bilirubin 0.3  0.3 - 1.2 (mg/dL)   GFR calc non Af Amer 27 (*) >90 (mL/min)   GFR calc Af Amer 32 (*) >90 (mL/min)  LIPASE, BLOOD      Component Value Range   Lipase 46  11 - 59 (U/L)  URINALYSIS, ROUTINE W REFLEX MICROSCOPIC      Component Value Range   Color, Urine YELLOW  YELLOW    Appearance CLEAR  CLEAR    Specific Gravity, Urine <1.005 (*) 1.005 - 1.030    pH 6.0  5.0 - 8.0    Glucose, UA NEGATIVE  NEGATIVE (mg/dL)   Hgb urine dipstick NEGATIVE  NEGATIVE     Bilirubin Urine NEGATIVE  NEGATIVE    Ketones, ur NEGATIVE  NEGATIVE (mg/dL)   Protein, ur NEGATIVE  NEGATIVE (mg/dL)   Urobilinogen, UA 0.2  0.0 - 1.0 (mg/dL)   Nitrite NEGATIVE  NEGATIVE    Leukocytes, UA NEGATIVE  NEGATIVE    Ct Abdomen Pelvis Wo Contrast  12/21/2010  *RADIOLOGY REPORT*  Clinical Data:  Right-sided flank pain.  No hematuria.  CT ABDOMEN AND PELVIS WITHOUT CONTRAST (CT UROGRAM)  Technique: Contiguous axial images of the abdomen and pelvis without oral or intravenous contrast were obtained.  Comparison: 01/03/2008  Findings:  Exam is limited for evaluation of entities other than urinary tract calculi due to lack of oral or intravenous contrast.   Right lung base 3 mm nodule on image 7 is unchanged and therefore benign.  Mild cardiomegaly without pericardial or pleural effusion.  Normal uninfused appearance of the liver, spleen, stomach, pancreas. Cholecystectomy without biliary ductal dilatation. Normal adrenal glands.  Interpolar right renal cyst. No renal calculi or hydronephrosis.  Phleboliths in the vicinity of the distal left ureter.  No hydroureter.  No ureteric stone. No retroperitoneal or retrocrural adenopathy.  Colonic stool burden suggests constipation.  Prior appendectomy. Normal small bowel without abdominal ascites.    No pelvic adenopathy.  Normal urinary bladder.  Tubal ligation. No adnexal mass or significant free pelvic fluid.  Question pelvic floor laxity. Degenerative changes of the right hip are moderate.  IMPRESSION:  1.  No urinary tract calculi or hydronephrosis. 2. Question constipation. 3.  Possible pelvic floor laxity.  Original Report Authenticated By: Consuello Bossier, M.D.       OTHER DATA REVIEWED: Nursing notes, vital signs, and past medical records reviewed.   DIAGNOSTIC STUDIES: Oxygen Saturation is 97% on room air, normal by my interpretation.     ED COURSE / COORDINATION OF CARE:  14:03 Ordered. CBC ; Differential ; Comprehensive  metabolic panel ; Lipase, blood ; Urinalysis with microscopic ; CT Abdomen Pelvis Wo Contrast ; morphine 4 MG/ML injection 4 mg ; ondansetron (ZOFRAN) injection 4 mg    MDM  Pt's symptoms seem most consistent for  renal colic.  S/p cholecystectomy, will check urine for infection, labs, CT abd/pelvis Pt has hx of renal insufficency, creatinine at baseline.  Given negative CT, feel that this could by MS back pain, as she is primarily having pain in back.  Will try muscle relaxer, f/u with PMD on Monday.  Has hx of chronic constipation and has multiple meds at home for this.  Will not start anything new for this  1. Back strain     I personally performed the services described in this documentation, which was scribed in my presence.  The recorded information has been reviewed and considered.       Rolan Bucco, MD 12/21/10 [phone]

## 2010-12-21 NOTE — ED Notes (Signed)
Pt c/o right side flank pain.

## 2010-12-25 ENCOUNTER — Encounter (HOSPITAL_COMMUNITY): Payer: Self-pay | Admitting: Internal Medicine

## 2010-12-26 ENCOUNTER — Encounter: Payer: Self-pay | Admitting: Internal Medicine

## 2010-12-30 ENCOUNTER — Telehealth: Payer: Self-pay | Admitting: Gastroenterology

## 2010-12-30 NOTE — Telephone Encounter (Signed)
Referral and notes faxed to [facility] - pt aware- she also wanted her bx results

## 2010-12-30 NOTE — Telephone Encounter (Signed)
Informed pt of letter sent by [facility] and informed her what it said.

## 2011-02-20 ENCOUNTER — Ambulatory Visit (INDEPENDENT_AMBULATORY_CARE_PROVIDER_SITE_OTHER): Payer: Medicare Other | Admitting: Otolaryngology

## 2011-02-20 DIAGNOSIS — R490 Dysphonia: Secondary | ICD-10-CM

## 2011-03-18 ENCOUNTER — Encounter (HOSPITAL_COMMUNITY)
Admission: RE | Admit: 2011-03-18 | Discharge: 2011-03-18 | Disposition: A | Discharge status: Home / Self Care | Payer: Medicare Other | Source: Ambulatory Visit | Attending: Ophthalmology | Admitting: Ophthalmology

## 2011-03-18 ENCOUNTER — Encounter (HOSPITAL_COMMUNITY): Payer: Self-pay

## 2011-03-18 HISTORY — DX: Other diseases of larynx: J38.7

## 2011-03-18 LAB — BASIC METABOLIC PANEL
BUN: 37 mg/dL — ABNORMAL HIGH (ref 6–23)
CO2: 30 mEq/L (ref 19–32)
Chloride: 101 mEq/L (ref 96–112)
Creatinine, Ser: 1.44 mg/dL — ABNORMAL HIGH (ref 0.50–1.10)
GFR calc Af Amer: 43 mL/min — ABNORMAL LOW (ref >90)

## 2011-03-18 LAB — HEMOGLOBIN AND HEMATOCRIT, BLOOD: Hemoglobin: 13.7 g/dL (ref 12.0–15.0)

## 2011-03-18 NOTE — Patient Instructions (Signed)
20 Krystal Powell  03/18/2011   Your procedure is scheduled on:  03/24/11  Report to [facility] at 10:00 AM.  Call this number if you have problems the morning of surgery: [phone]   Remember:   Do not eat food:After Midnight.  May have clear liquids:until Midnight .  Clear liquids include soda, tea, black coffee, apple or grape juice, broth.  Take these medicines the morning of surgery with A SIP OF WATER: Carvedilol, Levothyroxine, Claritin, Benicar-HCT, Ranitidine, Singular and Celebrex. Take your Antivert and Promethazine only if needed. Use your inhalers, Albuterol, Duoneb, and Dulera the morning of surgery and bring them with you to the hospital.   Do not wear jewelry, make-up or nail polish.  Do not wear lotions, powders, or perfumes. You may wear deodorant.  Do not shave 48 hours prior to surgery.  Do not bring valuables to the hospital.  Contacts, dentures or bridgework may not be worn into surgery.  Leave suitcase in the car. After surgery it may be brought to your room.  For patients admitted to the hospital, checkout time is 11:00 AM the day of discharge.   Patients discharged the day of surgery will not be allowed to drive home.  Name and phone number of your driver:   Special Instructions: N/A   Please read over the following fact sheets that you were given: Anesthesia Post-op Instructions and Care and Recovery After Surgery    Cataract A cataract is a clouding of the lens of the eye. It is most often related to aging. A cataract is not a "film" over the surface of the eye. The lens is inside the eye and changes size of the pupil. The lens can enlarge to let more light enter the eye in dark environments and contract the size of the pupil to let in bright light. The lens is the part of the eye that helps focus light on the retina. The retina is the eye's light-sensitive layer. It is in the back of the eye that sends visual signals to the brain. In a normal eye, light passes  through the lens and gets focused on the retina. To help produce a sharp image, the lens must remain clear. When a lens becomes cloudy, vision is compromised by the degree and nature of the clouding. Certain cataracts make people more near-sighted as they develop, others increase glare, and all reduce vision to some degree or another. A cataract that is so dense that it becomes milky Krystal Powell and a Krystal Powell opacity can be seen through the pupil. When the Krystal Powell color is seen, it is called a "mature" or "hyper-mature cataract." Such cataracts cause total blindness in the affected eye. The cataract must be removed to prevent damage to the eye itself. Some types of cataracts can cause a secondary disease of the eye, such as certain types of glaucoma. In the early stages, better lighting and eyeglasses may lessen vision problems caused by cataracts. At a certain point, surgery may be needed to improve vision. CAUSES   Aging. However, cataracts may occur at any age, even in newborns.   Certain drugs.   Trauma to the eye.   Certain diseases (such as diabetes).   Inherited or acquired medical syndromes.  SYMPTOMS   Gradual, progressive drop in vision in the affected eye. Cataracts may develop at different rates in each eye. Cataracts may even be in just one eye with the other unaffected.   Cataracts due to trauma may develop quickly, sometimes  over a matter or days or even hours. The result is severe and rapid visual loss.  DIAGNOSIS  To detect a cataract, an eye doctor examines the lens. A well developed cataract can be diagnosed without dilating the pupil. Early cataracts and others of a specific nature are best diagnosed with an exam of the eyes with the pupils dilated by drops. TREATMENT   For an early cataract, vision may improve by using different eyeglasses or stronger lighting.   If the above measures do not help, surgery is the only effective treatment. This treatment removes the cloudy lens and  replaces it with a substitute lens (Intraocular lens, or IOL). Newly developed IOL technology allows the implanted lens to improve vision both at a distance and up close. Discuss with your eye surgeon about the possibility of still needing glasses. Also discuss how visual coordination between both eyes will be affected.  A cataract needs to be removed only when vision loss interferes with your everyday activities such as driving, reading or watching TV. You and your eye doctor can make that decision together. In most cases, waiting until you are ready to have cataract surgery will not harm your eye. If you have cataracts in both eyes, only one should be removed at a time. This allows the operated eye to heal and be out of danger from serious problems (such as infection or poor wound healing) before having the other eye undergo surgery.  Sometimes, a cataract should be removed even if it does not cause problems with your vision. For example, a cataract should be removed if it prevents examination or treatment of another eye problem. Just as you cannot see out of the affected eye well, your doctor cannot see into your eye well through a cataract. The vast majority of people who have cataract surgery have better vision afterward. CATARACT REMOVAL There are two primary ways to remove a cataract. Your doctor can explain the differences and help determine which is best for you:  Phacoemulsification (small incision cataract surgery). This involves making a small cut (incision) on the edge of the clear, dome-shaped surface that covers the front of the eye (the cornea). An injection behind the eye or eye drops are given to make this a painless procedure. The doctor then inserts a tiny probe into the eye. This device emits ultrasound waves that soften and break up the cloudy center of the lens so it can be removed by suction. Most cataract surgery is done this way. The cuts are usually so small and performed in such a  manner that often no sutures are needed to keep it closed.   Extracapsular surgery. Your doctor makes a slightly longer incision on the side of the cornea. The doctor removes the hard center of the lens. The remainder of the lens is then removed by suction. In some cases, extremely fine sutures are needed which the doctor may, or may not remove in the office after the surgery.  When an IOL is implanted, it needs no care. It becomes a permanent part of your eye and cannot be seen or felt.  Some people cannot have an IOL. They may have problems during surgery, or maybe they have another eye disease. For these people, a soft contact lens may be suggested. If an IOL or contact lens cannot be used, very powerful and thick glasses are required after surgery. Since vision is very different through such thick glasses, it is important to have your doctor discuss the impact  on your vision after any cataract surgery where there is no plan to implant an IOL. The normal lens of the eye is covered by a clear capsule. Both phacoemulsification and extracapsular surgery require that the back surface of this lens capsule be left in place. This helps support IOLs and prevents the IOL from dislocating and falling back into the deeper interior of the eye. Right after surgery, and often permanently this "posterior capsule" remains clear. In some cases however, it can become cloudy, presenting the same type of visual compromise that the original cataract did since light is again obstructed as it passes through the clear IOL. This condition is often referred to as an "after-cataract." Fortunately, after-cataracts are easily treated using a painless and very fast laser treatment that is performed without anesthesia or incisions. It is done in a matter of minutes in an outpatient environment. Visual improvement is often immediate.  HOME CARE INSTRUCTIONS   Your surgeon will discuss pre and post operative care with you prior to  surgery. The majority of people are able to do almost all normal activities right away. Although, it is often advised to avoid strenuous activity for a period of time.   Postoperative drops and careful avoidance of infection will be needed. Many surgeons suggest the use of a protective shield during the first few days after surgery.   There is a very small incidence of complication from modern cataract surgery, but it can happen. Infection that spreads to the inside of the eye (endophthalmitis) can result in total visual loss and even loss of the eye itself. In extremely rare instances, the inflammation of endophthalmitis can spread to both eyes (sympathetic ophthalmia). Appropriate post-operative care under the close observation of your surgeon is essential to a successful outcome.  SEEK IMMEDIATE MEDICAL CARE IF:   You have any sudden drop of vision in the operated eye.   You have pain in the operated eye.   You see a large number of floating dots in the field of vision in the operated eye.   You see flashing lights, or if a portion of your side vision in any direction appears black (like a curtain being drawn into your field of vision) in the operated eye.  Document Released: 01/13/2005 Document Revised: 09/25/2010 Document Reviewed: 03/01/2007 [facility] Patient Information 2012 [place], [place].   PATIENT INSTRUCTIONS POST-ANESTHESIA  IMMEDIATELY FOLLOWING SURGERY:  Do not drive or operate machinery for the first twenty four hours after surgery.  Do not make any important decisions for twenty four hours after surgery or while taking narcotic pain medications or sedatives.  If you develop intractable nausea and vomiting or a severe headache please notify your doctor immediately.  FOLLOW-UP:  Please make an appointment with your surgeon as instructed. You do not need to follow up with anesthesia unless specifically instructed to do so.  WOUND CARE INSTRUCTIONS (if applicable):  Keep a dry  clean dressing on the anesthesia/puncture wound site if there is drainage.  Once the wound has quit draining you may leave it open to air.  Generally you should leave the bandage intact for twenty four hours unless there is drainage.  If the epidural site drains for more than 36-48 hours please call the anesthesia department.  QUESTIONS?:  Please feel free to call your physician or the hospital operator if you have any questions, and they will be happy to assist you.     [facility] Anesthesia Department [address] [place] [phone]

## 2011-03-21 MED ORDER — NEOMYCIN-POLYMYXIN-DEXAMETH 3.5-10000-0.1 OP OINT
TOPICAL_OINTMENT | OPHTHALMIC | Status: AC
  Filled 2011-03-21: qty 3.5

## 2011-03-21 MED ORDER — LIDOCAINE HCL (PF) 1 % IJ SOLN
INTRAMUSCULAR | Status: AC
  Filled 2011-03-21: qty 2

## 2011-03-21 MED ORDER — LIDOCAINE HCL 3.5 % OP GEL
OPHTHALMIC | Status: AC
  Filled 2011-03-21: qty 5

## 2011-03-21 MED ORDER — TETRACAINE HCL 0.5 % OP SOLN
OPHTHALMIC | Status: AC
  Filled 2011-03-21: qty 2

## 2011-03-21 MED ORDER — CYCLOPENTOLATE-PHENYLEPHRINE 0.2-1 % OP SOLN
OPHTHALMIC | Status: AC
  Filled 2011-03-21: qty 2

## 2011-03-24 ENCOUNTER — Encounter (HOSPITAL_COMMUNITY)
Admission: RE | Disposition: A | Discharge status: Home / Self Care | Payer: Self-pay | Source: Ambulatory Visit | Attending: Ophthalmology

## 2011-03-24 ENCOUNTER — Encounter (HOSPITAL_COMMUNITY): Payer: Self-pay | Admitting: Anesthesiology

## 2011-03-24 ENCOUNTER — Encounter (HOSPITAL_COMMUNITY): Payer: Self-pay | Admitting: *Deleted

## 2011-03-24 ENCOUNTER — Ambulatory Visit (HOSPITAL_COMMUNITY)
Admission: RE | Admit: 2011-03-24 | Discharge: 2011-03-24 | Disposition: A | Discharge status: Home / Self Care | Payer: Medicare Other | Source: Ambulatory Visit | Attending: Ophthalmology | Admitting: Ophthalmology

## 2011-03-24 ENCOUNTER — Ambulatory Visit (HOSPITAL_COMMUNITY): Payer: Medicare Other | Admitting: Anesthesiology

## 2011-03-24 DIAGNOSIS — Z01812 Encounter for preprocedural laboratory examination: Secondary | ICD-10-CM | POA: Insufficient documentation

## 2011-03-24 DIAGNOSIS — I10 Essential (primary) hypertension: Secondary | ICD-10-CM | POA: Insufficient documentation

## 2011-03-24 DIAGNOSIS — J40 Bronchitis, not specified as acute or chronic: Secondary | ICD-10-CM | POA: Insufficient documentation

## 2011-03-24 DIAGNOSIS — H2589 Other age-related cataract: Secondary | ICD-10-CM | POA: Insufficient documentation

## 2011-03-24 DIAGNOSIS — E119 Type 2 diabetes mellitus without complications: Secondary | ICD-10-CM | POA: Insufficient documentation

## 2011-03-24 HISTORY — PX: CATARACT EXTRACTION W/PHACO: SHX586

## 2011-03-24 LAB — GLUCOSE, CAPILLARY: Glucose-Capillary: 87 mg/dL (ref 70–99)

## 2011-03-24 SURGERY — PHACOEMULSIFICATION, CATARACT, WITH IOL INSERTION
Anesthesia: Monitor Anesthesia Care | Site: Eye | Laterality: Left | Wound class: Clean

## 2011-03-24 MED ORDER — MIDAZOLAM HCL 2 MG/2ML IJ SOLN
INTRAMUSCULAR | Status: AC
  Administered 2011-03-24: 2 mg via INTRAVENOUS
  Filled 2011-03-24: qty 2

## 2011-03-24 MED ORDER — LIDOCAINE HCL (PF) 1 % IJ SOLN
INTRAMUSCULAR | Status: DC | PRN
  Administered 2011-03-24: .5 mL

## 2011-03-24 MED ORDER — EPINEPHRINE HCL 1 MG/ML IJ SOLN
INTRAOCULAR | Status: DC | PRN
  Administered 2011-03-24: 12:00:00

## 2011-03-24 MED ORDER — NEOMYCIN-POLYMYXIN-DEXAMETH 0.1 % OP OINT
TOPICAL_OINTMENT | OPHTHALMIC | Status: DC | PRN
  Administered 2011-03-24: 1 via OPHTHALMIC

## 2011-03-24 MED ORDER — PHENYLEPHRINE HCL 2.5 % OP SOLN
OPHTHALMIC | Status: AC
  Administered 2011-03-24: 1 [drp] via OPHTHALMIC
  Filled 2011-03-24: qty 2

## 2011-03-24 MED ORDER — PHENYLEPHRINE HCL 2.5 % OP SOLN
1.0000 [drp] | OPHTHALMIC | Status: AC
  Administered 2011-03-24 (×3): 1 [drp] via OPHTHALMIC

## 2011-03-24 MED ORDER — BSS IO SOLN
INTRAOCULAR | Status: DC | PRN
  Administered 2011-03-24: 15 mL via INTRAOCULAR

## 2011-03-24 MED ORDER — PROVISC 10 MG/ML IO SOLN
INTRAOCULAR | Status: DC | PRN
  Administered 2011-03-24: 8.5 mg via INTRAOCULAR

## 2011-03-24 MED ORDER — MIDAZOLAM HCL 2 MG/2ML IJ SOLN
1.0000 mg | INTRAMUSCULAR | Status: DC | PRN
  Administered 2011-03-24 (×2): 2 mg via INTRAVENOUS

## 2011-03-24 MED ORDER — CYCLOPENTOLATE-PHENYLEPHRINE 0.2-1 % OP SOLN
1.0000 [drp] | OPHTHALMIC | Status: AC
  Administered 2011-03-24 (×3): 1 [drp] via OPHTHALMIC

## 2011-03-24 MED ORDER — TETRACAINE HCL 0.5 % OP SOLN
1.0000 [drp] | OPHTHALMIC | Status: AC
  Administered 2011-03-24 (×3): 1 [drp] via OPHTHALMIC

## 2011-03-24 MED ORDER — LACTATED RINGERS IV SOLN
INTRAVENOUS | Status: DC
  Administered 2011-03-24: 11:00:00 via INTRAVENOUS

## 2011-03-24 MED ORDER — LIDOCAINE 3.5 % OP GEL OPTIME - NO CHARGE
OPHTHALMIC | Status: DC | PRN
  Administered 2011-03-24: 2 [drp] via OPHTHALMIC

## 2011-03-24 MED ORDER — LIDOCAINE HCL 3.5 % OP GEL
1.0000 "application " | Freq: Once | OPHTHALMIC | Status: AC
  Administered 2011-03-24: 1 via OPHTHALMIC

## 2011-03-24 MED ORDER — EPINEPHRINE HCL 1 MG/ML IJ SOLN
INTRAMUSCULAR | Status: AC
  Filled 2011-03-24: qty 1

## 2011-03-24 MED ORDER — ONDANSETRON HCL 4 MG/2ML IJ SOLN
4.0000 mg | Freq: Once | INTRAMUSCULAR | Status: AC
  Administered 2011-03-24: 4 mg via INTRAVENOUS

## 2011-03-24 MED ORDER — ONDANSETRON HCL 4 MG/2ML IJ SOLN
INTRAMUSCULAR | Status: AC
  Administered 2011-03-24: 4 mg via INTRAVENOUS
  Filled 2011-03-24: qty 2

## 2011-03-24 SURGICAL SUPPLY — 31 items
CAPSULAR TENSION RING-AMO (OPHTHALMIC RELATED) IMPLANT
CLOTH BEACON ORANGE TIMEOUT ST (SAFETY) ×2 IMPLANT
EYE SHIELD UNIVERSAL CLEAR (GAUZE/BANDAGES/DRESSINGS) ×1 IMPLANT
GLOVE BIO SURGEON STRL SZ 6.5 (GLOVE) IMPLANT
GLOVE BIOGEL PI IND STRL 6.5 (GLOVE) ×2 IMPLANT
GLOVE BIOGEL PI IND STRL 7.0 (GLOVE) IMPLANT
GLOVE BIOGEL PI IND STRL 7.5 (GLOVE) IMPLANT
GLOVE BIOGEL PI INDICATOR 6.5 (GLOVE) ×2
GLOVE BIOGEL PI INDICATOR 7.0 (GLOVE)
GLOVE BIOGEL PI INDICATOR 7.5 (GLOVE)
GLOVE ECLIPSE 6.5 STRL STRAW (GLOVE) IMPLANT
GLOVE ECLIPSE 7.0 STRL STRAW (GLOVE) IMPLANT
GLOVE ECLIPSE 7.5 STRL STRAW (GLOVE) IMPLANT
GLOVE EXAM NITRILE LRG STRL (GLOVE) IMPLANT
GLOVE EXAM NITRILE MD LF STRL (GLOVE) ×2 IMPLANT
GLOVE SKINSENSE NS SZ6.5 (GLOVE)
GLOVE SKINSENSE NS SZ7.0 (GLOVE)
GLOVE SKINSENSE STRL SZ6.5 (GLOVE) IMPLANT
GLOVE SKINSENSE STRL SZ7.0 (GLOVE) IMPLANT
KIT VITRECTOMY (OPHTHALMIC RELATED) IMPLANT
PAD ARMBOARD 7.5X6 YLW CONV (MISCELLANEOUS) ×1 IMPLANT
PROC W NO LENS (INTRAOCULAR LENS)
PROC W SPEC LENS (INTRAOCULAR LENS)
PROCESS W NO LENS (INTRAOCULAR LENS) IMPLANT
PROCESS W SPEC LENS (INTRAOCULAR LENS) IMPLANT
RING MALYGIN (MISCELLANEOUS) IMPLANT
SIGHTPATH CAT PROC W REG LENS (Ophthalmic Related) ×2 IMPLANT
SYR TB 1ML LL NO SAFETY (SYRINGE) ×2 IMPLANT
TAPE PAPER MEDFIX 1IN X 10YD (GAUZE/BANDAGES/DRESSINGS) ×1 IMPLANT
VISCOELASTIC ADDITIONAL (OPHTHALMIC RELATED) IMPLANT
WATER STERILE IRR 250ML POUR (IV SOLUTION) ×1 IMPLANT

## 2011-03-24 NOTE — Anesthesia Preprocedure Evaluation (Signed)
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 on medications Regular Normal    Neuro/Psych    GI/Hepatic hiatal hernia, GERD-  Medicated and Poorly Controlled,  Endo/Other  Diabetes mellitus-, Well Controlled, Type 2Hypothyroidism   Renal/GU      Musculoskeletal   Abdominal   Peds  Hematology   Anesthesia Other Findings   Reproductive/Obstetrics                           Anesthesia Physical Anesthesia Plan  ASA: III  Anesthesia Plan: MAC   Post-op Pain Management:    Induction: Intravenous  Airway Management Planned: Nasal Cannula  Additional Equipment:   Intra-op Plan:   Post-operative Plan:   Informed Consent: I have reviewed the patients History and Physical, chart, labs and discussed the procedure including the risks, benefits and alternatives for the proposed anesthesia with the patient or authorized representative who has indicated his/her understanding and acceptance.     Plan Discussed with:   Anesthesia Plan Comments:         Anesthesia Quick Evaluation

## 2011-03-24 NOTE — Op Note (Signed)
NAMECORTNIE, RINGEL               ACCOUNT NO.:  [id number]  MEDICAL RECORD NO.:  [id number]  LOCATION:  [facility]                          FACILITY:  [facility]  PHYSICIAN:  Susanne Greenhouse, MD       DATE OF BIRTH:  16-Aug-1945  DATE OF PROCEDURE:  03/24/2011 DATE OF DISCHARGE:  03/24/2011                              OPERATIVE REPORT   PREOPERATIVE DIAGNOSIS:  Combined cataract, left eye, diagnosis code 366.19.  POSTOPERATIVE DIAGNOSIS:  Combined cataract, left eye, diagnosis code 366.19.  OPERATION PERFORMED:  Phacoemulsification with posterior chamber intraocular lens implantation, left eye.  SURGEON:  Bonne Dolores. Kylen Ismael, MD  ANESTHESIA:  General endotracheal anesthesia.  OPERATIVE SUMMARY:  In the preoperative area, dilating drops were placed into the left eye.  The patient was then brought into the operating room where she was placed under general anesthesia.  The eye was then prepped and draped.  Beginning with a 75 blade, a paracentesis port was made at the surgeon's 2 o'clock position.  The anterior chamber was then filled with a 1% nonpreserved lidocaine solution with epinephrine.  This was followed by Viscoat to deepen the chamber.  A small fornix-based peritomy was performed superiorly.  Next, a single iris hook was placed through the limbus superiorly.  A 2.4-mm keratome blade was then used to make a clear corneal incision over the iris hook.  A bent cystotome needle and Utrata forceps were used to create a continuous tear capsulotomy.  Hydrodissection was performed using balanced salt solution on a fine cannula.  The lens nucleus was then removed using phacoemulsification in a quadrant cracking technique.  The cortical material was then removed with irrigation and aspiration.  The capsular bag and anterior chamber were refilled with Provisc.  The wound was widened to approximately 3 mm and a posterior chamber intraocular lens was placed into the capsular bag without difficulty  using an [employer] lens injecting system.  A single 10-0 nylon suture was then used to close the incision as well as stromal hydration.  The Provisc was removed from the anterior chamber and capsular bag with irrigation and aspiration.  At this point, the wounds were tested for leak, which were negative.  The anterior chamber remained deep and stable.  The patient tolerated the procedure well.  There were no operative complications, and she awoke from general anesthesia without problem.  No surgical specimens.  Prosthetic device used is a [employer] , posterior chamber lens, model MX60, power of 19.5, serial number is [id number].          ______________________________ Susanne Greenhouse, MD     KEH/MEDQ  D:  03/24/2011  T:  03/24/2011  Job:  [id number]

## 2011-03-24 NOTE — Brief Op Note (Signed)
Pre-Op Dx: Cataract OS Post-Op Dx: Cataract OS Surgeon: Taura Lamarre Anesthesia: Topical with MAC Implant: B&L enVista Specimen: None Complications: None 

## 2011-03-24 NOTE — Transfer of Care (Signed)
Immediate Anesthesia Transfer of Care Note  Patient: Krystal Powell  Procedure(s) Performed: Procedure(s) (LRB): CATARACT EXTRACTION PHACO AND INTRAOCULAR LENS PLACEMENT (IOC) (Left)  Patient Location: PACU and Short Stay  Anesthesia Type: MAC  Level of Consciousness: awake  Airway & Oxygen Therapy: Patient Spontanous Breathing  Post-op Assessment: Report given to PACU RN  Post vital signs: Reviewed and stable  Complications: No apparent anesthesia complications

## 2011-03-24 NOTE — Discharge Instructions (Signed)
Krystal Powell  03/24/2011     Instructions  1. Use medications as Instructed.  Shake well before use. Wait 5 minutes between drops.  {OPHTHALMIC ANTIBIOTICS:22167} 4 times a day x 1 week.  {OPHTHALMIC ANTI-INFLAMMATORY:22168} 2 times a day x 4 weeks.  {OPHTHALMIC STEROID:22169} 4 times a day - week 1   3 times a day - Week 2, 2 times a day- Week 3, 1 time a day - Week 4.  2. Do not rub the operative eye. Do not swim underwater for 2 weeks.  3. You may remove the clear shield and resume your normal activities the day after  Surgery. Your eyes may feel more comfortable if you wear dark glasses outside.  4. Call our office at [phone] if you have sudden change in vision, extreme redness or pain. Some fluctuation in vision is normal after surgery. If you have an emergency after hours, call Dr. Alto Denver at [phone].  5. It is important that you attend all of your follow-up appointments.        Follow-up:{follow up:32580} with [name], MD.   Dr. Lahoma Crocker: [phone]  Dr. Lita Mains: [phone]  Dr. Alto Denver: [phone]   If you find that you cannot contact your physician, but feel that your signs and   Symptoms warrant a physician's attention, call the Emergency Room at   [phone] [phone].   Other{NA AND WUXLKGMW:10272}.

## 2011-03-24 NOTE — H&P (Signed)
I have reviewed the H&P, the patient was re-examined, and I have identified no interval changes in medical condition and plan of care since the history and physical of record  

## 2011-03-24 NOTE — Anesthesia Postprocedure Evaluation (Signed)
  Anesthesia Post-op Note  Patient: Krystal Powell  Procedure(s) Performed: Procedure(s) (LRB): CATARACT EXTRACTION PHACO AND INTRAOCULAR LENS PLACEMENT (IOC) (Left)  Patient Location: PACU and Short Stay  Anesthesia Type: MAC  Level of Consciousness: awake, alert  and oriented  Airway and Oxygen Therapy: Patient Spontanous Breathing  Post-op Pain: none  Post-op Assessment: Post-op Vital signs reviewed, Patient's Cardiovascular Status Stable, Respiratory Function Stable, Patent Airway and No signs of Nausea or vomiting  Post-op Vital Signs: Reviewed and stable  Complications: No apparent anesthesia complications

## 2011-03-26 ENCOUNTER — Encounter (HOSPITAL_COMMUNITY): Payer: Self-pay | Admitting: Ophthalmology

## 2011-04-01 ENCOUNTER — Encounter (HOSPITAL_COMMUNITY): Payer: Self-pay | Admitting: Pharmacy Technician

## 2011-04-02 ENCOUNTER — Encounter (HOSPITAL_COMMUNITY)
Admission: RE | Admit: 2011-04-02 | Discharge: 2011-04-02 | Payer: Medicare Other | Source: Ambulatory Visit | Admitting: Ophthalmology

## 2011-04-02 ENCOUNTER — Encounter (HOSPITAL_COMMUNITY): Payer: Self-pay

## 2011-04-02 ENCOUNTER — Telehealth: Payer: Self-pay | Admitting: Gastroenterology

## 2011-04-02 MED ORDER — FENTANYL CITRATE 0.05 MG/ML IJ SOLN: 25.0000 ug | INTRAMUSCULAR | Status: DC | PRN

## 2011-04-02 MED ORDER — TETRACAINE HCL 0.5 % OP SOLN
OPHTHALMIC | Status: AC
  Administered 2011-04-03: 1 [drp] via OPHTHALMIC
  Filled 2011-04-02: qty 2

## 2011-04-02 MED ORDER — CYCLOPENTOLATE-PHENYLEPHRINE 0.2-1 % OP SOLN
OPHTHALMIC | Status: AC
  Administered 2011-04-03: 1 [drp] via OPHTHALMIC
  Filled 2011-04-02: qty 2

## 2011-04-02 MED ORDER — LIDOCAINE HCL 3.5 % OP GEL
OPHTHALMIC | Status: AC
  Administered 2011-04-03: 1 via OPHTHALMIC
  Filled 2011-04-02: qty 5

## 2011-04-02 MED ORDER — ONDANSETRON HCL 4 MG/2ML IJ SOLN: 4.0000 mg | Freq: Once | INTRAMUSCULAR | Status: DC | PRN

## 2011-04-02 MED ORDER — LIDOCAINE HCL (PF) 1 % IJ SOLN
INTRAMUSCULAR | Status: AC
  Filled 2011-04-02: qty 2

## 2011-04-02 MED ORDER — NEOMYCIN-POLYMYXIN-DEXAMETH 3.5-10000-0.1 OP OINT
TOPICAL_OINTMENT | OPHTHALMIC | Status: AC
  Filled 2011-04-02: qty 3.5

## 2011-04-02 NOTE — Telephone Encounter (Signed)
Noted  

## 2011-04-02 NOTE — Telephone Encounter (Signed)
Pt was referred to [facility] for 24hr Esoph/PH Test - Per Clifton Custard - they attempted to contact pt several times by phone and also by mail- pt has yet to respond to their attempts- the will keep the referral on file till 04/1

## 2011-04-03 ENCOUNTER — Encounter (HOSPITAL_COMMUNITY): Payer: Self-pay | Admitting: Anesthesiology

## 2011-04-03 ENCOUNTER — Ambulatory Visit (HOSPITAL_COMMUNITY): Payer: Medicare Other | Admitting: Anesthesiology

## 2011-04-03 ENCOUNTER — Ambulatory Visit (HOSPITAL_COMMUNITY)
Admission: RE | Admit: 2011-04-03 | Discharge: 2011-04-03 | Disposition: A | Discharge status: Home / Self Care | Payer: Medicare Other | Source: Ambulatory Visit | Attending: Ophthalmology | Admitting: Ophthalmology

## 2011-04-03 ENCOUNTER — Encounter (HOSPITAL_COMMUNITY)
Admission: RE | Disposition: A | Discharge status: Home / Self Care | Payer: Self-pay | Source: Ambulatory Visit | Attending: Ophthalmology

## 2011-04-03 ENCOUNTER — Encounter (HOSPITAL_COMMUNITY): Payer: Self-pay | Admitting: *Deleted

## 2011-04-03 DIAGNOSIS — H2589 Other age-related cataract: Secondary | ICD-10-CM | POA: Insufficient documentation

## 2011-04-03 DIAGNOSIS — Z79899 Other long term (current) drug therapy: Secondary | ICD-10-CM | POA: Insufficient documentation

## 2011-04-03 DIAGNOSIS — I10 Essential (primary) hypertension: Secondary | ICD-10-CM | POA: Insufficient documentation

## 2011-04-03 DIAGNOSIS — E119 Type 2 diabetes mellitus without complications: Secondary | ICD-10-CM | POA: Insufficient documentation

## 2011-04-03 DIAGNOSIS — Z01812 Encounter for preprocedural laboratory examination: Secondary | ICD-10-CM | POA: Insufficient documentation

## 2011-04-03 HISTORY — PX: CATARACT EXTRACTION W/PHACO: SHX586

## 2011-04-03 LAB — GLUCOSE, CAPILLARY: Glucose-Capillary: 92 mg/dL (ref 70–99)

## 2011-04-03 SURGERY — PHACOEMULSIFICATION, CATARACT, WITH IOL INSERTION
Anesthesia: Monitor Anesthesia Care | Site: Eye | Laterality: Right | Wound class: Clean

## 2011-04-03 MED ORDER — ONDANSETRON HCL 4 MG/2ML IJ SOLN: 4.0000 mg | Freq: Once | INTRAMUSCULAR | Status: DC | PRN

## 2011-04-03 MED ORDER — TETRACAINE HCL 0.5 % OP SOLN
1.0000 [drp] | OPHTHALMIC | Status: AC
  Administered 2011-04-03 (×3): 1 [drp] via OPHTHALMIC

## 2011-04-03 MED ORDER — PHENYLEPHRINE HCL 2.5 % OP SOLN
1.0000 [drp] | OPHTHALMIC | Status: AC
  Administered 2011-04-03 (×3): 1 [drp] via OPHTHALMIC

## 2011-04-03 MED ORDER — MIDAZOLAM HCL 2 MG/2ML IJ SOLN
INTRAMUSCULAR | Status: AC
  Administered 2011-04-03: 2 mg via INTRAVENOUS
  Filled 2011-04-03: qty 2

## 2011-04-03 MED ORDER — BSS IO SOLN
INTRAOCULAR | Status: DC | PRN
  Administered 2011-04-03: 15 mL via INTRAOCULAR

## 2011-04-03 MED ORDER — MIDAZOLAM HCL 5 MG/5ML IJ SOLN
INTRAMUSCULAR | Status: DC | PRN
  Administered 2011-04-03 (×2): 1 mg via INTRAVENOUS

## 2011-04-03 MED ORDER — LIDOCAINE HCL (PF) 1 % IJ SOLN
INTRAMUSCULAR | Status: DC | PRN
  Administered 2011-04-03: .5 mL

## 2011-04-03 MED ORDER — LIDOCAINE 3.5 % OP GEL OPTIME - NO CHARGE
OPHTHALMIC | Status: DC | PRN
  Administered 2011-04-03: 1 [drp] via OPHTHALMIC

## 2011-04-03 MED ORDER — PHENYLEPHRINE HCL 2.5 % OP SOLN
OPHTHALMIC | Status: AC
  Administered 2011-04-03: 1 [drp] via OPHTHALMIC
  Filled 2011-04-03: qty 2

## 2011-04-03 MED ORDER — NEOMYCIN-POLYMYXIN-DEXAMETH 0.1 % OP OINT
TOPICAL_OINTMENT | OPHTHALMIC | Status: DC | PRN
  Administered 2011-04-03: 1 via OPHTHALMIC

## 2011-04-03 MED ORDER — PROVISC 10 MG/ML IO SOLN
INTRAOCULAR | Status: DC | PRN
  Administered 2011-04-03: 8.5 mg via INTRAOCULAR

## 2011-04-03 MED ORDER — EPINEPHRINE HCL 1 MG/ML IJ SOLN
INTRAOCULAR | Status: DC | PRN
  Administered 2011-04-03: 12:00:00

## 2011-04-03 MED ORDER — EPINEPHRINE HCL 1 MG/ML IJ SOLN
INTRAMUSCULAR | Status: AC
  Filled 2011-04-03: qty 1

## 2011-04-03 MED ORDER — CYCLOPENTOLATE-PHENYLEPHRINE 0.2-1 % OP SOLN
1.0000 [drp] | OPHTHALMIC | Status: AC
  Administered 2011-04-03 (×3): 1 [drp] via OPHTHALMIC

## 2011-04-03 MED ORDER — LIDOCAINE HCL 3.5 % OP GEL
1.0000 "application " | Freq: Once | OPHTHALMIC | Status: AC
  Administered 2011-04-03: 1 via OPHTHALMIC

## 2011-04-03 MED ORDER — LACTATED RINGERS IV SOLN
INTRAVENOUS | Status: DC
  Administered 2011-04-03: 1000 mL via INTRAVENOUS

## 2011-04-03 MED ORDER — MIDAZOLAM HCL 2 MG/2ML IJ SOLN
1.0000 mg | INTRAMUSCULAR | Status: DC | PRN
  Administered 2011-04-03: 2 mg via INTRAVENOUS

## 2011-04-03 MED ORDER — FENTANYL CITRATE 0.05 MG/ML IJ SOLN: 25.0000 ug | INTRAMUSCULAR | Status: DC | PRN

## 2011-04-03 MED ORDER — MIDAZOLAM HCL 2 MG/2ML IJ SOLN
INTRAMUSCULAR | Status: AC
  Filled 2011-04-03: qty 2

## 2011-04-03 SURGICAL SUPPLY — 33 items
CAPSULAR TENSION RING-AMO (OPHTHALMIC RELATED) IMPLANT
CLOTH BEACON ORANGE TIMEOUT ST (SAFETY) ×2 IMPLANT
EYE SHIELD UNIVERSAL CLEAR (GAUZE/BANDAGES/DRESSINGS) ×1 IMPLANT
GLOVE BIO SURGEON STRL SZ 6.5 (GLOVE) IMPLANT
GLOVE BIOGEL PI IND STRL 6.5 (GLOVE) ×2 IMPLANT
GLOVE BIOGEL PI IND STRL 7.0 (GLOVE) IMPLANT
GLOVE BIOGEL PI IND STRL 7.5 (GLOVE) IMPLANT
GLOVE BIOGEL PI INDICATOR 6.5 (GLOVE) ×2
GLOVE BIOGEL PI INDICATOR 7.0 (GLOVE)
GLOVE BIOGEL PI INDICATOR 7.5 (GLOVE)
GLOVE ECLIPSE 6.5 STRL STRAW (GLOVE) IMPLANT
GLOVE ECLIPSE 7.0 STRL STRAW (GLOVE) IMPLANT
GLOVE ECLIPSE 7.5 STRL STRAW (GLOVE) IMPLANT
GLOVE EXAM NITRILE LRG STRL (GLOVE) IMPLANT
GLOVE EXAM NITRILE MD LF STRL (GLOVE) ×2 IMPLANT
GLOVE SKINSENSE NS SZ6.5 (GLOVE)
GLOVE SKINSENSE NS SZ7.0 (GLOVE)
GLOVE SKINSENSE STRL SZ6.5 (GLOVE) IMPLANT
GLOVE SKINSENSE STRL SZ7.0 (GLOVE) IMPLANT
GOWN STRL REIN XL XLG (GOWN DISPOSABLE) ×2 IMPLANT
KIT VITRECTOMY (OPHTHALMIC RELATED) IMPLANT
PAD ARMBOARD 7.5X6 YLW CONV (MISCELLANEOUS) ×2 IMPLANT
PROC W NO LENS (INTRAOCULAR LENS)
PROC W SPEC LENS (INTRAOCULAR LENS)
PROCESS W NO LENS (INTRAOCULAR LENS) IMPLANT
PROCESS W SPEC LENS (INTRAOCULAR LENS) IMPLANT
RING MALYGIN (MISCELLANEOUS) IMPLANT
SIGHTPATH CAT PROC W REG LENS (Ophthalmic Related) ×2 IMPLANT
SYR TB 1ML LL NO SAFETY (SYRINGE) ×2 IMPLANT
TAPE SURG TRANSPORE 1 IN (GAUZE/BANDAGES/DRESSINGS) IMPLANT
TAPE SURGICAL TRANSPORE 1 IN (GAUZE/BANDAGES/DRESSINGS) ×1
VISCOELASTIC ADDITIONAL (OPHTHALMIC RELATED) IMPLANT
WATER STERILE IRR 250ML POUR (IV SOLUTION) ×1 IMPLANT

## 2011-04-03 NOTE — H&P (Signed)
I have reviewed the H&P, the patient was re-examined, and I have identified no interval changes in medical condition and plan of care since the history and physical of record  

## 2011-04-03 NOTE — Anesthesia Postprocedure Evaluation (Signed)
  Anesthesia Post-op Note  Patient: Krystal Powell  Procedure(s) Performed: Procedure(s) (LRB): CATARACT EXTRACTION PHACO AND INTRAOCULAR LENS PLACEMENT (IOC) (Right)  Patient Location: PACU and Short Stay  Anesthesia Type: MAC  Level of Consciousness: awake, alert , oriented and patient cooperative  Airway and Oxygen Therapy: Patient Spontanous Breathing  Post-op Pain: none  Post-op Assessment: Post-op Vital signs reviewed, Patient's Cardiovascular Status Stable, Respiratory Function Stable, Patent Airway, No signs of Nausea or vomiting, Adequate PO intake and Pain level controlled  Post-op Vital Signs: Reviewed and stable  Complications: No apparent anesthesia complications

## 2011-04-03 NOTE — Transfer of Care (Signed)
Immediate Anesthesia Transfer of Care Note  Patient: Krystal Powell  Procedure(s) Performed: Procedure(s) (LRB): CATARACT EXTRACTION PHACO AND INTRAOCULAR LENS PLACEMENT (IOC) (Right)  Patient Location: PACU and Short Stay  Anesthesia Type: MAC  Level of Consciousness: awake, alert , oriented and patient cooperative  Airway & Oxygen Therapy: Patient Spontanous Breathing  Post-op Assessment: Report given to PACU RN, Post -op Vital signs reviewed and stable and Patient moving all extremities  Post vital signs: Reviewed and stable  Complications: No apparent anesthesia complications

## 2011-04-03 NOTE — Brief Op Note (Signed)
Pre-Op Dx: Cataract OD Post-Op Dx: Cataract OD Surgeon: Rosalie Gelpi Anesthesia: Topical with MAC Implant: Lenstec, Model B&L enVista Blood Loss: None Specimen: None Complications: None 

## 2011-04-03 NOTE — Anesthesia Preprocedure Evaluation (Signed)
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 on medications Rhythm:Regular Rate:Normal     Neuro/Psych    GI/Hepatic hiatal hernia, GERD-  Medicated and Poorly Controlled,  Endo/Other  Diabetes mellitus-, Well Controlled, Type 2Hypothyroidism   Renal/GU      Musculoskeletal   Abdominal   Peds  Hematology   Anesthesia Other Findings   Reproductive/Obstetrics                           Anesthesia Physical Anesthesia Plan  ASA: III  Anesthesia Plan: MAC   Post-op Pain Management:    Induction: Intravenous  Airway Management Planned: Nasal Cannula  Additional Equipment:   Intra-op Plan:   Post-operative Plan:   Informed Consent: I have reviewed the patients History and Physical, chart, labs and discussed the procedure including the risks, benefits and alternatives for the proposed anesthesia with the patient or authorized representative who has indicated his/her understanding and acceptance.     Plan Discussed with:   Anesthesia Plan Comments:         Anesthesia Quick Evaluation

## 2011-04-03 NOTE — Discharge Instructions (Signed)
Krystal Powell  04/03/2011     Instructions  1. Use medications as Instructed.  Shake well before use. Wait 5 minutes between drops.  {OPHTHALMIC ANTIBIOTICS:22167} 4 times a day x 1 week.  {OPHTHALMIC ANTI-INFLAMMATORY:22168} 2 times a day x 4 weeks.  {OPHTHALMIC STEROID:22169} 4 times a day - week 1   3 times a day - Week 2, 2 times a day- Week 3, 1 time a day - Week 4.  2. Do not rub the operative eye. Do not swim underwater for 2 weeks.  3. You may remove the clear shield and resume your normal activities the day after  Surgery. Your eyes may feel more comfortable if you wear dark glasses outside.  4. Call our office at [phone] if you have sudden change in vision, extreme redness or pain. Some fluctuation in vision is normal after surgery. If you have an emergency after hours, call Dr. Alto Denver at [phone].  5. It is important that you attend all of your follow-up appointments.        Follow-up:{follow up:32580} with [name], MD.   Dr. Lahoma Crocker: [phone]  Dr. Lita Mains: [phone]  Dr. Alto Denver: [phone]   If you find that you cannot contact your physician, but feel that your signs and   Symptoms warrant a physician's attention, call the Emergency Room at   [phone] [phone].   Other{NA AND WUXLKGMW:10272}.

## 2011-04-04 NOTE — Op Note (Signed)
[name], [name]               ACCOUNT NO.:  [id number]  MEDICAL RECORD NO.:  [id number]  LOCATION:  [facility]                          FACILITY:  [facility]  PHYSICIAN:  Susanne Greenhouse, MD       DATE OF BIRTH:  11/05/1945  DATE OF PROCEDURE:  04/03/2011 DATE OF DISCHARGE:  04/03/2011                              OPERATIVE REPORT   PREOPERATIVE DIAGNOSIS:  Combined cataract, right eye, diagnosis code 366.19.  POSTOPERATIVE DIAGNOSIS:  Combined cataract, right eye, diagnosis code 366.19.  OPERATION PERFORMED:  Phacoemulsification with posterior chamber intraocular lens implantation, right eye.  SURGEON:  Bonne Dolores. Yardley Lekas, MD  ANESTHESIA:  General endotracheal anesthesia.  OPERATIVE SUMMARY:  In the preoperative area, dilating drops were placed into the right eye.  The patient was then brought into the operating room where she was placed under general anesthesia.  The eye was then prepped and draped.  Beginning with a 75 blade, a paracentesis port was made at the surgeon's 2 o'clock position.  The anterior chamber was then filled with a 1% nonpreserved lidocaine solution with epinephrine.  This was followed by Viscoat to deepen the chamber.  A small fornix-based peritomy was performed superiorly.  Next, a single iris hook was placed through the limbus superiorly.  A 2.4-mm keratome blade was then used to make a clear corneal incision over the iris hook.  A bent cystotome needle and Utrata forceps were used to create a continuous tear capsulotomy.  Hydrodissection was performed using balanced salt solution on a fine cannula.  The lens nucleus was then removed using phacoemulsification in a quadrant cracking technique.  The cortical material was then removed with irrigation and aspiration.  The capsular bag and anterior chamber were refilled with Provisc.  The wound was widened to approximately 3 mm and a posterior chamber intraocular lens was placed into the capsular bag without difficulty  using an [employer] lens injecting system.  A single 10-0 nylon suture was then used to close the incision as well as stromal hydration.  The Provisc was removed from the anterior chamber and capsular bag with irrigation and aspiration.  At this point, the wounds were tested for leak, which were negative.  The anterior chamber remained deep and stable.  The patient tolerated the procedure well.  There were no operative complications, and she awoke from general anesthesia without problem.  No surgical specimens.  Prosthetic device used is a [employer] posterior chamber intraocular lens, model MX60, power of 19.0, serial number is [id number].          ______________________________ Susanne Greenhouse, MD     KEH/MEDQ  D:  04/03/2011  T:  04/04/2011  Job:  [id number]

## 2011-04-07 ENCOUNTER — Encounter (HOSPITAL_COMMUNITY): Payer: Self-pay | Admitting: Ophthalmology

## 2011-04-29 ENCOUNTER — Ambulatory Visit: Payer: Medicare Other | Admitting: Orthopedic Surgery

## 2011-05-01 ENCOUNTER — Encounter (HOSPITAL_COMMUNITY): Payer: Self-pay

## 2011-05-01 ENCOUNTER — Inpatient Hospital Stay (HOSPITAL_COMMUNITY): Payer: Medicare Other

## 2011-05-01 ENCOUNTER — Inpatient Hospital Stay (HOSPITAL_COMMUNITY)
Admission: AD | Admit: 2011-05-01 | Discharge: 2011-05-04 | DRG: 194 | Disposition: A | Discharge status: Home Health | Payer: Medicare Other | Source: Ambulatory Visit | Attending: Internal Medicine | Admitting: Internal Medicine

## 2011-05-01 DIAGNOSIS — J189 Pneumonia, unspecified organism: Principal | ICD-10-CM | POA: Diagnosis present

## 2011-05-01 DIAGNOSIS — M1710 Unilateral primary osteoarthritis, unspecified knee: Secondary | ICD-10-CM

## 2011-05-01 DIAGNOSIS — R059 Cough, unspecified: Secondary | ICD-10-CM | POA: Diagnosis present

## 2011-05-01 DIAGNOSIS — E7800 Pure hypercholesterolemia, unspecified: Secondary | ICD-10-CM | POA: Diagnosis present

## 2011-05-01 DIAGNOSIS — K219 Gastro-esophageal reflux disease without esophagitis: Secondary | ICD-10-CM | POA: Diagnosis present

## 2011-05-01 DIAGNOSIS — J441 Chronic obstructive pulmonary disease with (acute) exacerbation: Secondary | ICD-10-CM | POA: Diagnosis present

## 2011-05-01 DIAGNOSIS — E785 Hyperlipidemia, unspecified: Secondary | ICD-10-CM | POA: Diagnosis present

## 2011-05-01 DIAGNOSIS — E876 Hypokalemia: Secondary | ICD-10-CM | POA: Diagnosis present

## 2011-05-01 DIAGNOSIS — E039 Hypothyroidism, unspecified: Secondary | ICD-10-CM | POA: Diagnosis present

## 2011-05-01 DIAGNOSIS — IMO0002 Reserved for concepts with insufficient information to code with codable children: Secondary | ICD-10-CM

## 2011-05-01 DIAGNOSIS — J45901 Unspecified asthma with (acute) exacerbation: Secondary | ICD-10-CM | POA: Diagnosis present

## 2011-05-01 DIAGNOSIS — E669 Obesity, unspecified: Secondary | ICD-10-CM | POA: Diagnosis present

## 2011-05-01 DIAGNOSIS — I10 Essential (primary) hypertension: Secondary | ICD-10-CM | POA: Diagnosis present

## 2011-05-01 DIAGNOSIS — R05 Cough: Secondary | ICD-10-CM | POA: Diagnosis present

## 2011-05-01 LAB — CBC
MCH: 29.7 pg (ref 26.0–34.0)
MCV: 89.5 fL (ref 78.0–100.0)
Platelets: 286 10*3/uL (ref 150–400)
RDW: 12.6 % (ref 11.5–15.5)
WBC: 9.5 10*3/uL (ref 4.0–10.5)

## 2011-05-01 LAB — COMPREHENSIVE METABOLIC PANEL
ALT: 11 U/L (ref 0–35)
AST: 19 U/L (ref 0–37)
Albumin: 3 g/dL — ABNORMAL LOW (ref 3.5–5.2)
Calcium: 10.3 mg/dL (ref 8.4–10.5)
Sodium: 140 mEq/L (ref 135–145)
Total Protein: 7.2 g/dL (ref 6.0–8.3)

## 2011-05-01 LAB — DIFFERENTIAL
Basophils Absolute: 0 10*3/uL (ref 0.0–0.1)
Eosinophils Absolute: 0.7 10*3/uL (ref 0.0–0.7)
Eosinophils Relative: 8 % — ABNORMAL HIGH (ref 0–5)
Neutrophils Relative %: 59 % (ref 43–77)

## 2011-05-01 MED ORDER — POTASSIUM CHLORIDE CRYS ER 10 MEQ PO TBCR
10.0000 meq | EXTENDED_RELEASE_TABLET | Freq: Every day | ORAL | Status: DC
  Administered 2011-05-01 – 2011-05-04 (×4): 10 meq via ORAL
  Filled 2011-05-01 (×4): qty 1

## 2011-05-01 MED ORDER — ALPRAZOLAM 0.5 MG PO TABS
0.5000 mg | ORAL_TABLET | Freq: Every day | ORAL | Status: DC
Start: 2011-05-01 — End: 2011-05-04
  Administered 2011-05-01 – 2011-05-03 (×3): 0.5 mg via ORAL
  Filled 2011-05-01 (×3): qty 1

## 2011-05-01 MED ORDER — OLMESARTAN MEDOXOMIL-HCTZ 40-12.5 MG PO TABS: 0.5000 | ORAL_TABLET | Freq: Every day | ORAL | Status: DC

## 2011-05-01 MED ORDER — FLUTICASONE PROPIONATE 50 MCG/ACT NA SUSP
2.0000 | Freq: Every day | NASAL | Status: DC
  Administered 2011-05-02 – 2011-05-04 (×3): 2 via NASAL
  Filled 2011-05-01: qty 16

## 2011-05-01 MED ORDER — MOMETASONE FURO-FORMOTEROL FUM 100-5 MCG/ACT IN AERO
2.0000 | INHALATION_SPRAY | Freq: Two times a day (BID) | RESPIRATORY_TRACT | Status: DC
  Administered 2011-05-02 – 2011-05-04 (×5): 2 via RESPIRATORY_TRACT
  Filled 2011-05-01: qty 13

## 2011-05-01 MED ORDER — PREDNISONE 20 MG PO TABS
60.0000 mg | ORAL_TABLET | Freq: Two times a day (BID) | ORAL | Status: DC
  Administered 2011-05-01 – 2011-05-02 (×2): 60 mg via ORAL
  Filled 2011-05-01 (×2): qty 3

## 2011-05-01 MED ORDER — SODIUM CHLORIDE 0.9 % IV SOLN
600.0000 mg | Freq: Two times a day (BID) | INTRAVENOUS | Status: DC
  Administered 2011-05-01 – 2011-05-03 (×5): 600 mg via INTRAVENOUS
  Filled 2011-05-01 (×8): qty 600

## 2011-05-01 MED ORDER — CARVEDILOL 3.125 MG PO TABS
6.2500 mg | ORAL_TABLET | Freq: Two times a day (BID) | ORAL | Status: DC
  Administered 2011-05-01 – 2011-05-04 (×6): 6.25 mg via ORAL
  Filled 2011-05-01 (×3): qty 2
  Filled 2011-05-01: qty 1
  Filled 2011-05-01 (×3): qty 2

## 2011-05-01 MED ORDER — OMEGA-3 FATTY ACIDS 1000 MG PO CAPS: 1.0000 g | ORAL_CAPSULE | ORAL | Status: DC

## 2011-05-01 MED ORDER — IRBESARTAN 150 MG PO TABS
150.0000 mg | ORAL_TABLET | Freq: Every day | ORAL | Status: DC
  Administered 2011-05-01 – 2011-05-04 (×4): 150 mg via ORAL
  Filled 2011-05-01 (×4): qty 1

## 2011-05-01 MED ORDER — CELECOXIB 100 MG PO CAPS
200.0000 mg | ORAL_CAPSULE | Freq: Two times a day (BID) | ORAL | Status: DC
  Administered 2011-05-01 – 2011-05-04 (×6): 200 mg via ORAL
  Filled 2011-05-01 (×4): qty 2
  Filled 2011-05-01: qty 1
  Filled 2011-05-01: qty 2
  Filled 2011-05-01: qty 1
  Filled 2011-05-01: qty 2

## 2011-05-01 MED ORDER — IPRATROPIUM-ALBUTEROL 0.5-2.5 (3) MG/3ML IN SOLN: 3.0000 mL | Freq: Four times a day (QID) | RESPIRATORY_TRACT | Status: DC

## 2011-05-01 MED ORDER — FENOFIBRATE 160 MG PO TABS
160.0000 mg | ORAL_TABLET | Freq: Every day | ORAL | Status: DC
  Administered 2011-05-02 – 2011-05-04 (×3): 160 mg via ORAL
  Filled 2011-05-01 (×4): qty 1

## 2011-05-01 MED ORDER — PANTOPRAZOLE SODIUM 40 MG PO TBEC
40.0000 mg | DELAYED_RELEASE_TABLET | Freq: Two times a day (BID) | ORAL | Status: DC
  Administered 2011-05-02 – 2011-05-04 (×5): 40 mg via ORAL
  Filled 2011-05-01 (×5): qty 1

## 2011-05-01 MED ORDER — PROMETHAZINE HCL 12.5 MG PO TABS: 12.5000 mg | ORAL_TABLET | Freq: Four times a day (QID) | ORAL | Status: DC | PRN

## 2011-05-01 MED ORDER — MONTELUKAST SODIUM 10 MG PO TABS
10.0000 mg | ORAL_TABLET | Freq: Every day | ORAL | Status: DC
  Administered 2011-05-01 – 2011-05-03 (×3): 10 mg via ORAL
  Filled 2011-05-01 (×3): qty 1

## 2011-05-01 MED ORDER — HYDROCODONE-ACETAMINOPHEN 7.5-325 MG PO TABS: 1.0000 | ORAL_TABLET | Freq: Four times a day (QID) | ORAL | Status: DC | PRN

## 2011-05-01 MED ORDER — ALBUTEROL SULFATE (5 MG/ML) 0.5% IN NEBU
2.5000 mg | INHALATION_SOLUTION | Freq: Four times a day (QID) | RESPIRATORY_TRACT | Status: DC
  Administered 2011-05-01 – 2011-05-04 (×11): 2.5 mg via RESPIRATORY_TRACT
  Filled 2011-05-01 (×12): qty 0.5

## 2011-05-01 MED ORDER — SODIUM CHLORIDE 0.9 % IV SOLN
INTRAVENOUS | Status: DC
  Administered 2011-05-01: 19:00:00 via INTRAVENOUS

## 2011-05-01 MED ORDER — OMEGA-3-ACID ETHYL ESTERS 1 G PO CAPS
1.0000 g | ORAL_CAPSULE | ORAL | Status: DC
  Administered 2011-05-02 – 2011-05-04 (×3): 1 g via ORAL
  Filled 2011-05-01 (×4): qty 1

## 2011-05-01 MED ORDER — DEXTROSE 5 % IV SOLN
INTRAVENOUS | Status: AC
  Filled 2011-05-01: qty 500

## 2011-05-01 MED ORDER — CEFTAROLINE FOSAMIL 400 MG IV SOLR
INTRAVENOUS | Status: AC
  Filled 2011-05-01: qty 400

## 2011-05-01 MED ORDER — DEXTROSE 5 % IV SOLN
500.0000 mg | INTRAVENOUS | Status: DC
  Administered 2011-05-01 – 2011-05-02 (×2): 500 mg via INTRAVENOUS
  Filled 2011-05-01 (×4): qty 500

## 2011-05-01 MED ORDER — VITAMIN B-12 1000 MCG PO TABS
2500.0000 ug | ORAL_TABLET | Freq: Every day | ORAL | Status: DC
  Administered 2011-05-02 – 2011-05-04 (×3): 2500 ug via ORAL
  Filled 2011-05-01 (×3): qty 3

## 2011-05-01 MED ORDER — ENOXAPARIN SODIUM 40 MG/0.4ML ~~LOC~~ SOLN
40.0000 mg | Freq: Every day | SUBCUTANEOUS | Status: DC
  Administered 2011-05-01 – 2011-05-03 (×3): 40 mg via SUBCUTANEOUS
  Filled 2011-05-01 (×3): qty 0.4

## 2011-05-01 MED ORDER — ALUM & MAG HYDROXIDE-SIMETH 200-200-20 MG/5ML PO SUSP: 30.0000 mL | Freq: Four times a day (QID) | ORAL | Status: DC | PRN

## 2011-05-01 MED ORDER — LORATADINE 10 MG PO TABS
10.0000 mg | ORAL_TABLET | Freq: Every day | ORAL | Status: DC
  Administered 2011-05-02 – 2011-05-04 (×3): 10 mg via ORAL
  Filled 2011-05-01 (×3): qty 1

## 2011-05-01 MED ORDER — IPRATROPIUM BROMIDE 0.02 % IN SOLN
0.5000 mg | Freq: Four times a day (QID) | RESPIRATORY_TRACT | Status: DC
  Administered 2011-05-01 – 2011-05-04 (×10): 0.5 mg via RESPIRATORY_TRACT
  Filled 2011-05-01 (×11): qty 2.5

## 2011-05-01 MED ORDER — LEVOTHYROXINE SODIUM 100 MCG PO TABS
100.0000 ug | ORAL_TABLET | ORAL | Status: DC
  Filled 2011-05-01 (×2): qty 1

## 2011-05-01 MED ORDER — DIPHENHYDRAMINE HCL 25 MG PO CAPS
50.0000 mg | ORAL_CAPSULE | Freq: Every day | ORAL | Status: DC
  Administered 2011-05-01 – 2011-05-03 (×3): 50 mg via ORAL
  Filled 2011-05-01 (×7): qty 2

## 2011-05-01 MED ORDER — MAGNESIUM HYDROXIDE 400 MG/5ML PO SUSP: 30.0000 mL | Freq: Every day | ORAL | Status: DC | PRN

## 2011-05-01 MED ORDER — DM-GUAIFENESIN ER 30-600 MG PO TB12
1.0000 | ORAL_TABLET | Freq: Two times a day (BID) | ORAL | Status: DC
  Administered 2011-05-02: 1 via ORAL
  Filled 2011-05-01: qty 1

## 2011-05-01 MED ORDER — HYDROCHLOROTHIAZIDE 25 MG PO TABS
6.2500 mg | ORAL_TABLET | Freq: Every day | ORAL | Status: DC
  Administered 2011-05-01 – 2011-05-04 (×4): 12.5 mg via ORAL
  Filled 2011-05-01 (×4): qty 1

## 2011-05-01 MED ORDER — ACETAMINOPHEN 500 MG PO TABS
1000.0000 mg | ORAL_TABLET | Freq: Two times a day (BID) | ORAL | Status: DC
  Administered 2011-05-01 – 2011-05-04 (×6): 1000 mg via ORAL
  Filled 2011-05-01 (×7): qty 2

## 2011-05-01 MED ORDER — BENZONATATE 100 MG PO CAPS
200.0000 mg | ORAL_CAPSULE | Freq: Three times a day (TID) | ORAL | Status: DC | PRN
  Administered 2011-05-02 (×2): 200 mg via ORAL
  Filled 2011-05-01 (×2): qty 2

## 2011-05-01 MED ORDER — CELECOXIB 100 MG PO CAPS
ORAL_CAPSULE | ORAL | Status: AC
  Filled 2011-05-01: qty 2

## 2011-05-01 NOTE — H&P (Signed)
Hospital Admission Note Date: 05/01/2011  Patient name: Krystal Powell Medical record number: [id number] Date of birth: [birth year] Age: 66 y.o. Gender: female PCP: Dwana Melena, MD, MD  Attending physician: Christiane Ha, MD  Chief Complaint: Cough for 3 months.  History of Present Illness:  Krystal Powell is an 66 y.o. female with multiple medical problems who was directly admitted from Dr. Scharlene Gloss office for COPD exacerbation and cough, failed multiple courses of antibiotics and steroids. She reportedly has had several courses of respiratory flora quinolones, doxycycline, prednisone. She continues to have a cough productive of green sputum. She has been wheezing. She has had malaise and fatigue. She's had subjective fevers and chills. Her appetite has been poor. She's had rhinorrhea. No sinus congestion or postnasal drip. She reports having lost 30 pounds since her cough started several months ago. She has a history of COPD but never smoked. She has not had a chest x-ray. She had diffuse myalgias yesterday.  Past Medical History  Diagnosis Date  . COPD (chronic obstructive pulmonary disease)   . Hypertension   . Asthma   . GERD (gastroesophageal reflux disease)   . Hiatal hernia   . S/P colonoscopy 03/02/2008    Dr Rourk->hyperplastic polyp, pancolonic diverticula  . S/P endoscopy 01/25/2008    Dr Rourk-> crk-screwing distal esophagus, Schatzki's ring dilated 29F, pyloric channel erosions  . Schatzki's ring   . Adenomatous polyps     Dr Gwenevere Abbot  . Spondylosis   . Hypercholesteremia   . Anxiety   . Hypothyroidism   . PONV (postoperative nausea and vomiting)   . Degenerative disc disease   . Chronic back pain   . Larynx disease     pt's left side if stiff and has protein deposits on it which causes her to lose air on that side and can't talk as well due to it    Meds: Prescriptions prior to admission  Medication Sig Dispense Refill  . acetaminophen (TYLENOL) 500 MG tablet Take  1,000 mg by mouth 2 (two) times daily.        Marland Kitchen albuterol (PROVENTIL HFA;VENTOLIN HFA) 108 (90 BASE) MCG/ACT inhaler Inhale 2 puffs into the lungs every 6 (six) hours as needed. For shortness of breath       . ALPRAZolam (XANAX) 0.5 MG tablet Take 0.5 mg by mouth at bedtime.       . beclomethasone (BECONASE-AQ) 42 MCG/SPRAY nasal spray Place 2 sprays into the nose 2 (two) times daily.       . carvedilol (COREG) 6.25 MG tablet Take 6.25 mg by mouth 2 (two) times daily with a meal.       . CELEBREX 200 MG capsule Take 200 mg by mouth 2 (two) times daily.       . Cholecalciferol (VITAMIN D) 2000 UNITS tablet Take 2,000 Units by mouth every morning.        . Cyanocobalamin 2500 MCG TABS Take 1 tablet by mouth every morning.        . diphenhydrAMINE (BENADRYL) 25 MG tablet Take 50 mg by mouth at bedtime.      . fenofibrate 160 MG tablet Take 160 mg by mouth daily after breakfast.       . fish oil-omega-3 fatty acids 1000 MG capsule Take 1 g by mouth every morning.       Marland Kitchen HYDROcodone-acetaminophen (NORCO) 7.5-325 MG per tablet Take 1 tablet by mouth every 6 (six) hours as needed. For pain      .  ipratropium-albuterol (DUONEB) 0.5-2.5 (3) MG/3ML SOLN Take 3 mLs by nebulization 4 (four) times daily as needed. For shortness of breath      . levothyroxine (SYNTHROID, LEVOTHROID) 100 MCG tablet Take 100 mcg by mouth every morning.       . loratadine (CLARITIN) 10 MG tablet Take 10 mg by mouth daily.      . meclizine (ANTIVERT) 25 MG tablet Take 25 mg by mouth 3 (three) times daily as needed. For dizziness      . mometasone-formoterol (DULERA) 100-5 MCG/ACT AERO Inhale 2 puffs into the lungs 2 (two) times daily.      Marland Kitchen olmesartan-hydrochlorothiazide (BENICAR HCT) 40-12.5 MG per tablet Take 0.5 tablets by mouth daily.       Marland Kitchen omeprazole (PRILOSEC) 20 MG capsule Take 20 mg by mouth 2 (two) times daily.      . potassium chloride (K-DUR,KLOR-CON) 10 MEQ tablet Take 10 mEq by mouth daily.       Marland Kitchen PRESCRIPTION  MEDICATION Take 5 mLs by mouth every 6 (six) hours as needed. [facility] compound  Of hydrocodone 5 mg/ 5 mL, phenylephrine 10 mg/  5 mL, chlorphenamine 4mg  / 5 mL with honey       . promethazine (PHENERGAN) 25 MG tablet Take 25 mg by mouth every 6 (six) hours as needed. For nausea      . ranitidine (ZANTAC) 150 MG tablet Take 150 mg by mouth at bedtime.      . Red Yeast Rice 600 MG CAPS Take 1 capsule by mouth every morning.        Marland Kitchen SINGULAIR 10 MG tablet Take 10 mg by mouth at bedtime.       . vitamin E 1000 UNIT capsule Take 1,000 Units by mouth every morning.          Allergies: Adhesive; Augmentin; Demeclocycline hcl; Iodinated diagnostic agents; Iodine; Iohexol; ZOX:[id number] acid+aspartame; Sulfonamide derivatives; Thorazine; and Codeine History   Social History  . Marital Status: Married    Spouse Name: N/A    Number of Children: 3  . Years of Education: N/A   Occupational History  . disabled    Social History Main Topics  . Smoking status: Never Smoker   . Smokeless tobacco: Not on file  . Alcohol Use: No  . Drug Use: No  . Sexually Active: Yes    Birth Control/ Protection: Surgical   Other Topics Concern  . Not on file   Social History Narrative  . No narrative on file   Family History  Problem Relation Age of Onset  . GER disease Mother   . Goiter Mother     malignant  . GER disease Father   . Anesthesia problems Neg Hx   . Hypotension Neg Hx   . Malignant hyperthermia Neg Hx   . Pseudochol deficiency Neg Hx    Past Surgical History  Procedure Date  . Knee surgery     multiple knee arthroscopies  . Tonsillectomy and adenoidectomy   . Bladder suspension   . Partial hysterectomy   . Appendectomy   . Cholecystectomy   . Umbilical hernia repair   . Laparoscopy   . Carotid endarterectomy     right  . Sinus exploration   . Shoulder surgery     right  . Foot surgery     right  . Maloney dilation 12/18/2010     Procedure: [name] DILATION;  Surgeon: Corbin Ade, MD;  Location: [facility];  Service: Endoscopy;  Laterality: N/A;  #56 dilator  . Abdominal hysterectomy     partial  . Tubal ligation   . Cataract extraction w/phaco 03/24/2011    Procedure: CATARACT EXTRACTION PHACO AND INTRAOCULAR LENS PLACEMENT (IOC);  Surgeon: [name], MD;  Location: [facility];  Service: Ophthalmology;  Laterality: Left;  CDE 15.27  . Cataract extraction w/phaco 04/03/2011    Procedure: CATARACT EXTRACTION PHACO AND INTRAOCULAR LENS PLACEMENT (IOC);  Surgeon: [name], MD;  Location: [facility];  Service: Ophthalmology;  Laterality: Right;  CDE:7.41    Review of Systems: Systems reviewed and as per HPI, otherwise negative.  Physical Exam: Blood pressure 141/86, pulse 94, temperature 97.4 F (36.3 C), temperature source Oral, resp. rate 18, height 5\' 2"  (1.575 m), SpO2 95.00%. BP 141/86  Pulse 94  Temp(Src) 97.4 F (36.3 C) (Oral)  Resp 18  Ht 5\' 2"  (1.575 m)  SpO2 95%  General Appearance:    weak appearing. Intermittent cough. No distress.   Head:    Normocephalic, without obvious abnormality, atraumatic  Eyes:    PERRL, conjunctiva/corneas clear, EOM's intact, fundi    benign, both eyes  Ears:    Normal TM's and external ear canals, both ears  Nose:   Nares normal, septum midline, mucosa normal,  Clear rhinorrhea   or sinus tenderness  Throat:   Lips, mucosa, and tongue normal; teeth and gums normal  Neck:   Supple, symmetrical, trachea midline, no adenopathy;    thyroid:  no enlargement/tenderness/nodules; no carotid   bruit or JVD  Back:     Symmetric, no curvature, ROM normal, no CVA tenderness  Lungs:     diminished throughout with mild bilateral wheezing. Prolonged expiratory phase.   Chest Wall:    No tenderness or deformity   Heart:    Regular rate and rhythm, S1 and S2 normal, no murmur, rub   or gallop     Abdomen:     Soft, non-tender, bowel sounds active all four quadrants,    no masses, no  organomegaly  Genitalia:   deferred   Rectal:   deferred   Extremities:   Extremities normal, atraumatic, no cyanosis or edema  Pulses:   2+ and symmetric all extremities  Skin:   Skin color, dry with poor turgor, no rashes or lesions  Lymph nodes:   Cervical, supraclavicular, and axillary nodes normal  Neurologic:   CNII-XII intact, normal strength, sensation and reflexes    throughout    Psychiatric: Flat affect.  Lab results: Basic Metabolic Panel: No results found for this basename: NA:2,K:2,CL:2,CO2:2,GLUCOSE:2,BUN:2,CREATININE:2,CALCIUM:2,MG:2,PHOS:2 in the last 72 hours Liver Function Tests: No results found for this basename: AST:2,ALT:2,ALKPHOS:2,BILITOT:2,PROT:2,ALBUMIN:2 in the last 72 hours No results found for this basename: LIPASE:2,AMYLASE:2 in the last 72 hours No results found for this basename: AMMONIA:2 in the last 72 hours CBC: No results found for this basename: WBC:2,NEUTROABS:2,HGB:2,HCT:2,MCV:2,PLT:2 in the last 72 hours Cardiac Enzymes: No results found for this basename: CKTOTAL:3,CKMB:3,CKMBINDEX:3,TROPONINI:3 in the last 72 hours BNP: No results found for this basename: PROBNP:3 in the last 72 hours D-Dimer: No results found for this basename: DDIMER:2 in the last 72 hours CBG:  Basename 05/01/11 1704  GLUCAP 97   Hemoglobin A1C: No results found for this basename: HGBA1C in the last 72 hours Fasting Lipid Panel: No results found for this basename: CHOL,HDL,LDLCALC,TRIG,CHOLHDL,LDLDIRECT in the last 72 hours Thyroid Function Tests: No results found for this basename: TSH,T4TOTAL,FREET4,T3FREE,THYROIDAB in the last 72 hours Anemia Panel: No results found for this basename: VITAMINB12,FOLATE,FERRITIN,TIBC,IRON,RETICCTPCT in  the last 72 hours Coagulation: No results found for this basename: LABPROT:2,INR:2 in the last 72 hours Urine Drug Screen: Drugs of Abuse  No results found for this basename: labopia, cocainscrnur, labbenz, amphetmu, thcu,  labbarb    Alcohol Level: No results found for this basename: ETH:2 in the last 72 hours Urinalysis: No results found for this basename: COLORURINE:2,APPERANCEUR:2,LABSPEC:2,PHURINE:2,GLUCOSEU:2,HGBUR:2,BILIRUBINUR:2,KETONESUR:2,PROTEINUR:2,UROBILINOGEN:2,NITRITE:2,LEUKOCYTESUR:2 in the last 72 hours   Imaging results:  No results found.  Assessment & Plan: Principal Problem:  *COPD exacerbation Active Problems:  Cough  Hypertension  Hypothyroidism  Obesity  GERD (gastroesophageal reflux disease)  Hyperlipidemia  Will check a chest x-ray. CBC with differential. See Matt. TSH. Give IV fluids. Bronchodilators, steroids, antibiotics. She may need further workup depending on her progress and the results of above. Continue outpatient medications. DVT prophylaxis.  Enrigue Hashimi L 05/01/2011, 6:02 PM

## 2011-05-02 DIAGNOSIS — E876 Hypokalemia: Secondary | ICD-10-CM | POA: Diagnosis present

## 2011-05-02 DIAGNOSIS — J189 Pneumonia, unspecified organism: Secondary | ICD-10-CM | POA: Diagnosis present

## 2011-05-02 LAB — TSH: TSH: 0.854 u[IU]/mL (ref 0.350–4.500)

## 2011-05-02 MED ORDER — PREDNISONE 20 MG PO TABS
40.0000 mg | ORAL_TABLET | Freq: Two times a day (BID) | ORAL | Status: DC
  Administered 2011-05-02 – 2011-05-03 (×2): 40 mg via ORAL
  Filled 2011-05-02 (×2): qty 2

## 2011-05-02 MED ORDER — POTASSIUM CHLORIDE CRYS ER 20 MEQ PO TBCR
40.0000 meq | EXTENDED_RELEASE_TABLET | Freq: Once | ORAL | Status: AC
  Administered 2011-05-02: 40 meq via ORAL
  Filled 2011-05-02: qty 2

## 2011-05-02 MED ORDER — BENZONATATE 100 MG PO CAPS
200.0000 mg | ORAL_CAPSULE | Freq: Three times a day (TID) | ORAL | Status: DC
  Administered 2011-05-02 – 2011-05-04 (×6): 200 mg via ORAL
  Filled 2011-05-02 (×6): qty 2

## 2011-05-02 MED ORDER — ALBUTEROL SULFATE (5 MG/ML) 0.5% IN NEBU: 2.5000 mg | INHALATION_SOLUTION | RESPIRATORY_TRACT | Status: DC | PRN

## 2011-05-02 NOTE — Progress Notes (Signed)
UR Chart Review Completed  

## 2011-05-02 NOTE — Progress Notes (Signed)
Subjective: Feels a little bit better. Still coughing a lot. Complains that Mucinex DM makes her feel "out of body experience". Tessalon Perles have helped. Appetite has improved.  Objective: Vital signs in last 24 hours: Filed Vitals:   05/02/11 0011 05/02/11 0521 05/02/11 0741 05/02/11 1112  BP:  137/79    Pulse:  67    Temp:  97.4 F (36.3 C)    TempSrc:  Oral    Resp:  20    Height:      SpO2: 97% 98% 98% 98%   Weight change:   Intake/Output Summary (Last 24 hours) at 05/02/11 1339 Last data filed at 05/02/11 1300  Gross per 24 hour  Intake 1593.75 ml  Output    500 ml  Net 1093.75 ml   Physical Exam: Gen.: Brighter. Lungs: Coughing. Course. No rales. Mild wheeze Cardiovascular regular rate rhythm without murmurs gallops rubs Abdomen soft nontender nondistended Extremities no clubbing cyanosis or edema  Lab Results: Basic Metabolic Panel:  Lab 05/01/11 1610  NA 140  K 3.4*  CL 100  CO2 31  GLUCOSE 103*  BUN 24*  CREATININE 1.27*  CALCIUM 10.3  MG --  PHOS --   Liver Function Tests:  Lab 05/01/11 1901  AST 19  ALT 11  ALKPHOS 64  BILITOT 0.7  PROT 7.2  ALBUMIN 3.0*   No results found for this basename: LIPASE:2,AMYLASE:2 in the last 168 hours No results found for this basename: AMMONIA:2 in the last 168 hours CBC:  Lab 05/01/11 1901  WBC 9.5  NEUTROABS 5.6  HGB 12.1  HCT 36.5  MCV 89.5  PLT 286   Cardiac Enzymes: No results found for this basename: CKTOTAL:3,CKMB:3,CKMBINDEX:3,TROPONINI:3 in the last 168 hours BNP: No results found for this basename: PROBNP:3 in the last 168 hours D-Dimer: No results found for this basename: DDIMER:2 in the last 168 hours CBG:  Lab 05/01/11 1704  GLUCAP 97   Hemoglobin A1C: No results found for this basename: HGBA1C in the last 168 hours Fasting Lipid Panel: No results found for this basename: CHOL,HDL,LDLCALC,TRIG,CHOLHDL,LDLDIRECT in the last 960 hours Thyroid Function Tests: No results found  for this basename: TSH,T4TOTAL,FREET4,T3FREE,THYROIDAB in the last 168 hours Coagulation: No results found for this basename: LABPROT:4,INR:4 in the last 168 hours Anemia Panel: No results found for this basename: VITAMINB12,FOLATE,FERRITIN,TIBC,IRON,RETICCTPCT in the last 168 hours Urine Drug Screen: Drugs of Abuse  No results found for this basename: labopia, cocainscrnur, labbenz, amphetmu, thcu, labbarb    Alcohol Level: No results found for this basename: ETH:2 in the last 168 hours Urinalysis: No results found for this basename: COLORURINE:2,APPERANCEUR:2,LABSPEC:2,PHURINE:2,GLUCOSEU:2,HGBUR:2,BILIRUBINUR:2,KETONESUR:2,PROTEINUR:2,UROBILINOGEN:2,NITRITE:2,LEUKOCYTESUR:2 in the last 168 hours  Micro Results: No results found for this or any previous visit (from the past 240 hour(s)). Studies/Results: Dg Chest 2 View  05/01/2011  *RADIOLOGY REPORT*  Clinical Data: Cough and short of breath  CHEST - 2 VIEW  Comparison: 06/14/2010  Findings: Mild cardiomegaly.  Bronchitic changes.  Low volumes. Consolidation in the posterior basal segment of the left lower lobe.  No pneumothorax and no pleural effusion.  IMPRESSION: Left lower lobe consolidation.  Original Report Authenticated By: Donavan Burnet, M.D.   Scheduled Meds:   . acetaminophen  1,000 mg Oral BID  . albuterol  2.5 mg Nebulization QID   And  . ipratropium  0.5 mg Nebulization QID  . ALPRAZolam  0.5 mg Oral QHS  . azithromycin  500 mg Intravenous Q24H  . benzonatate  200 mg Oral TID  . carvedilol  6.25  mg Oral BID WC  . ceftaroline (TEFLARO) 600 mg IVPB  600 mg Intravenous Q12H  . celecoxib  200 mg Oral BID  . diphenhydrAMINE  50 mg Oral QHS  . enoxaparin  40 mg Subcutaneous QHS  . fenofibrate  160 mg Oral QPC breakfast  . fluticasone  2 spray Each Nare Daily  . irbesartan  150 mg Oral Daily   And  . hydrochlorothiazide  12.5 mg Oral Daily  . levothyroxine  100 mcg Oral BH-q7a  . loratadine  10 mg Oral Daily  .  mometasone-formoterol  2 puff Inhalation BID  . montelukast  10 mg Oral QHS  . omega-3 acid ethyl esters  1 g Oral BH-q7a  . pantoprazole  40 mg Oral BID AC  . potassium chloride  10 mEq Oral Daily  . predniSONE  60 mg Oral BID  . vitamin B-12  2,500 mcg Oral Daily  . DISCONTD: dextromethorphan-guaiFENesin  1 tablet Oral BID  . DISCONTD: fish oil-omega-3 fatty acids  1 g Oral BH-q7a  . DISCONTD: ipratropium-albuterol  3 mL Nebulization QID  . DISCONTD: olmesartan-hydrochlorothiazide  0.5 tablet Oral Daily   Continuous Infusions:   . sodium chloride 75 mL/hr at 05/01/11 1851   PRN Meds:.albuterol, alum & mag hydroxide-simeth, HYDROcodone-acetaminophen, magnesium hydroxide, promethazine, DISCONTD: benzonatate Assessment/Plan: Principal Problem:  *CAP (community acquired pneumonia) Active Problems:  COPD exacerbation  Cough  Hypertension  Hypothyroidism  Obesity  GERD (gastroesophageal reflux disease)  Hyperlipidemia  Hypokalemia  Check urine Legionella antigen. Urine for strep pneumonia antigen. Sputum culture. Decrease IV fluids to KVO. Continue current antibiotics. Replete potassium. Discontinue Unasyn next DM per patient request. Continue bronchodilators. Change prednisone to 40 mg twice daily.   LOS: 1 day   Krystal Powell 05/02/2011, 1:39 PM

## 2011-05-03 LAB — EXPECTORATED SPUTUM ASSESSMENT W GRAM STAIN, RFLX TO RESP C

## 2011-05-03 LAB — STREP PNEUMONIAE URINARY ANTIGEN: Strep Pneumo Urinary Antigen: NEGATIVE

## 2011-05-03 MED ORDER — AZITHROMYCIN 250 MG PO TABS
250.0000 mg | ORAL_TABLET | Freq: Every day | ORAL | Status: DC
  Administered 2011-05-04: 250 mg via ORAL
  Filled 2011-05-03: qty 1

## 2011-05-03 MED ORDER — PREDNISONE 20 MG PO TABS
40.0000 mg | ORAL_TABLET | Freq: Every day | ORAL | Status: DC
  Administered 2011-05-04: 40 mg via ORAL
  Filled 2011-05-03: qty 2

## 2011-05-03 MED ORDER — AZITHROMYCIN 250 MG PO TABS
500.0000 mg | ORAL_TABLET | Freq: Every day | ORAL | Status: DC
  Administered 2011-05-03: 500 mg via ORAL
  Filled 2011-05-03: qty 2

## 2011-05-03 NOTE — Progress Notes (Signed)
Subjective: No new complaints. Continues to improve daily  Objective: Vital signs in last 24 hours: Filed Vitals:   05/03/11 0421 05/03/11 0720 05/03/11 1432 05/03/11 1503  BP: 150/74  152/74   Pulse: 70  74   Temp: 97.4 F (36.3 C)  97.6 F (36.4 C)   TempSrc: Axillary  Oral   Resp: 20  17   Height:      SpO2: 97% 97% 95% 96%   Weight change:   Intake/Output Summary (Last 24 hours) at 05/03/11 1726 Last data filed at 05/03/11 1300  Gross per 24 hour  Intake    900 ml  Output   2700 ml  Net  -1800 ml   Physical Exam: Gen.: Brighter. Lungs: Less cough . No rales. Less wheeze Cardiovascular regular rate rhythm without murmurs gallops rubs Abdomen soft nontender nondistended Extremities no clubbing cyanosis or edema  Lab Results: Basic Metabolic Panel:  Lab 05/01/11 4098  NA 140  K 3.4*  CL 100  CO2 31  GLUCOSE 103*  BUN 24*  CREATININE 1.27*  CALCIUM 10.3  MG --  PHOS --   Liver Function Tests:  Lab 05/01/11 1901  AST 19  ALT 11  ALKPHOS 64  BILITOT 0.7  PROT 7.2  ALBUMIN 3.0*   No results found for this basename: LIPASE:2,AMYLASE:2 in the last 168 hours No results found for this basename: AMMONIA:2 in the last 168 hours CBC:  Lab 05/01/11 1901  WBC 9.5  NEUTROABS 5.6  HGB 12.1  HCT 36.5  MCV 89.5  PLT 286   Cardiac Enzymes: No results found for this basename: CKTOTAL:3,CKMB:3,CKMBINDEX:3,TROPONINI:3 in the last 168 hours BNP: No results found for this basename: PROBNP:3 in the last 168 hours D-Dimer: No results found for this basename: DDIMER:2 in the last 168 hours CBG:  Lab 05/01/11 1704  GLUCAP 97   Hemoglobin A1C: No results found for this basename: HGBA1C in the last 168 hours Fasting Lipid Panel: No results found for this basename: CHOL,HDL,LDLCALC,TRIG,CHOLHDL,LDLDIRECT in the last 119 hours Thyroid Function Tests:  Lab 05/01/11 1901  TSH 0.854  T4TOTAL --  FREET4 --  T3FREE --  THYROIDAB --   Coagulation: No results  found for this basename: LABPROT:4,INR:4 in the last 168 hours Anemia Panel: No results found for this basename: VITAMINB12,FOLATE,FERRITIN,TIBC,IRON,RETICCTPCT in the last 168 hours Urine Drug Screen: Drugs of Abuse  No results found for this basename: labopia,  cocainscrnur,  labbenz,  amphetmu,  thcu,  labbarb    Alcohol Level: No results found for this basename: ETH:2 in the last 168 hours Urinalysis: No results found for this basename: COLORURINE:2,APPERANCEUR:2,LABSPEC:2,PHURINE:2,GLUCOSEU:2,HGBUR:2,BILIRUBINUR:2,KETONESUR:2,PROTEINUR:2,UROBILINOGEN:2,NITRITE:2,LEUKOCYTESUR:2 in the last 168 hours  Micro Results: Recent Results (from the past 240 hour(s))  CULTURE, SPUTUM-ASSESSMENT     Status: Normal   Collection Time   05/03/11  8:38 AM      Component Value Range Status Comment   Specimen Description SPUTUM   Final    Special Requests NONE   Final    Sputum evaluation     Final    Value: MICROSCOPIC FINDINGS SUGGEST THAT THIS SPECIMEN IS NOT REPRESENTATIVE OF LOWER RESPIRATORY SECRETIONS. PLEASE RECOLLECT.     CRITICAL RESULT CALLED TO, READ BACK BY AND VERIFIED WITH:  SEHONWITZ, L. AT  10:35AM ON 05/03/11 BY PRUITT, C   Report Status 05/03/2011 FINAL   Final    Studies/Results: Dg Chest 2 View  05/01/2011  *RADIOLOGY REPORT*  Clinical Data: Cough and short of breath  CHEST - 2 VIEW  Comparison: 06/14/2010  Findings: Mild cardiomegaly.  Bronchitic changes.  Low volumes. Consolidation in the posterior basal segment of the left lower lobe.  No pneumothorax and no pleural effusion.  IMPRESSION: Left lower lobe consolidation.  Original Report Authenticated By: Donavan Burnet, M.D.   Scheduled Meds:    . acetaminophen  1,000 mg Oral BID  . albuterol  2.5 mg Nebulization QID   And  . ipratropium  0.5 mg Nebulization QID  . ALPRAZolam  0.5 mg Oral QHS  . azithromycin  500 mg Oral Daily  . benzonatate  200 mg Oral TID  . carvedilol  6.25 mg Oral BID WC  . ceftaroline (TEFLARO)  600 mg IVPB  600 mg Intravenous Q12H  . celecoxib  200 mg Oral BID  . diphenhydrAMINE  50 mg Oral QHS  . enoxaparin  40 mg Subcutaneous QHS  . fenofibrate  160 mg Oral QPC breakfast  . fluticasone  2 spray Each Nare Daily  . irbesartan  150 mg Oral Daily   And  . hydrochlorothiazide  12.5 mg Oral Daily  . levothyroxine  100 mcg Oral BH-q7a  . loratadine  10 mg Oral Daily  . mometasone-formoterol  2 puff Inhalation BID  . montelukast  10 mg Oral QHS  . omega-3 acid ethyl esters  1 g Oral BH-q7a  . pantoprazole  40 mg Oral BID AC  . potassium chloride  10 mEq Oral Daily  . predniSONE  40 mg Oral Q breakfast  . vitamin B-12  2,500 mcg Oral Daily  . DISCONTD: azithromycin  500 mg Intravenous Q24H  . DISCONTD: predniSONE  40 mg Oral BID   Continuous Infusions:    . DISCONTD: sodium chloride 20 mL/hr at 05/02/11 1419   PRN Meds:.albuterol, alum & mag hydroxide-simeth, HYDROcodone-acetaminophen, magnesium hydroxide, promethazine Assessment/Plan: Principal Problem:  *CAP (community acquired pneumonia) Active Problems:  COPD exacerbation  Cough  Hypertension  Hypothyroidism  Obesity  GERD (gastroesophageal reflux disease)  Hyperlipidemia  Hypokalemia  Change azithromycin to po. Wean prednisone. Increase activity   LOS: 2 days   Isaic Syler L 05/03/2011, 5:26 PM

## 2011-05-04 LAB — LEGIONELLA ANTIGEN, URINE

## 2011-05-04 LAB — EXPECTORATED SPUTUM ASSESSMENT W GRAM STAIN, RFLX TO RESP C

## 2011-05-04 MED ORDER — CEFUROXIME AXETIL 500 MG PO TABS: 500.0000 mg | ORAL_TABLET | Freq: Two times a day (BID) | ORAL | Status: AC

## 2011-05-04 MED ORDER — PREDNISONE 10 MG PO TABS: ORAL_TABLET | ORAL | Status: DC

## 2011-05-04 MED ORDER — AZITHROMYCIN 250 MG PO TABS: 250.0000 mg | ORAL_TABLET | Freq: Every day | ORAL | Status: DC

## 2011-05-04 NOTE — Progress Notes (Signed)
Discharge instructions read to patient. Patient understood instructions and new prescriptions

## 2011-05-04 NOTE — Discharge Summary (Signed)
Physician Discharge Summary  Patient ID: Krystal Powell MRN: [id number] DOB/AGE: 1945-05-10 66 y.o.  Admit date: 05/01/2011 Discharge date: 05/04/2011  Discharge Diagnoses:  Principal Problem:  *CAP (community acquired pneumonia) Active Problems:  COPD exacerbation  Hypertension  Hypothyroidism  Obesity  GERD (gastroesophageal reflux disease)  Hyperlipidemia  Hypokalemia   Medication List  As of 05/04/2011 10:39 AM   TAKE these medications         acetaminophen 500 MG tablet   Commonly known as: TYLENOL   Take 1,000 mg by mouth 2 (two) times daily.      albuterol 108 (90 BASE) MCG/ACT inhaler   Commonly known as: PROVENTIL HFA;VENTOLIN HFA   Inhale 2 puffs into the lungs every 6 (six) hours as needed. For shortness of breath        ALPRAZolam 0.5 MG tablet   Commonly known as: XANAX   Take 0.5 mg by mouth at bedtime.      azithromycin 250 MG tablet   Commonly known as: ZITHROMAX   Take 1 tablet (250 mg total) by mouth daily.      beclomethasone 42 MCG/SPRAY nasal spray   Commonly known as: BECONASE-AQ   Place 2 sprays into the nose 2 (two) times daily.      carvedilol 6.25 MG tablet   Commonly known as: COREG   Take 6.25 mg by mouth 2 (two) times daily with a meal.      cefUROXime 500 MG tablet   Commonly known as: CEFTIN   Take 1 tablet (500 mg total) by mouth 2 (two) times daily.      CELEBREX 200 MG capsule   Generic drug: celecoxib   Take 200 mg by mouth 2 (two) times daily.      cetirizine 10 MG tablet   Commonly known as: ZYRTEC   Take 10 mg by mouth daily.      Cyanocobalamin 2500 MCG Tabs   Take 1 tablet by mouth every morning.      diphenhydrAMINE 25 MG tablet   Commonly known as: BENADRYL   Take 25 mg by mouth at bedtime.      fenofibrate 160 MG tablet   Take 160 mg by mouth daily after breakfast.      fish oil-omega-3 fatty acids 1000 MG capsule   Take 1 g by mouth every morning.      HYDROcodone-acetaminophen 7.5-325 MG per tablet   Commonly known as: NORCO   Take 1 tablet by mouth every 6 (six) hours as needed. For pain      ipratropium-albuterol 0.5-2.5 (3) MG/3ML Soln   Commonly known as: DUONEB   Take 3 mLs by nebulization 4 (four) times daily as needed. For shortness of breath      levothyroxine 100 MCG tablet   Commonly known as: SYNTHROID, LEVOTHROID   Take 100 mcg by mouth every morning.      meclizine 25 MG tablet   Commonly known as: ANTIVERT   Take 25 mg by mouth 3 (three) times daily as needed. For dizziness      mometasone-formoterol 100-5 MCG/ACT Aero   Commonly known as: DULERA   Inhale 2 puffs into the lungs 2 (two) times daily.      olmesartan-hydrochlorothiazide 40-12.5 MG per tablet   Commonly known as: BENICAR HCT   Take 0.5 tablets by mouth daily.      omeprazole 20 MG capsule   Commonly known as: PRILOSEC   Take 20 mg by mouth 2 (two) times daily.  potassium chloride 10 MEQ tablet   Commonly known as: K-DUR,KLOR-CON   Take 10 mEq by mouth daily.      predniSONE 10 MG tablet   Commonly known as: DELTASONE   3 tablets daily for 3 days, then decrease by 1 tablet every 3 days until off      PRESCRIPTION MEDICATION   Take 5 mLs by mouth every 6 (six) hours as needed. [place] [facility] compound  Of hydrocodone 5 mg/ 5 mL, phenylephrine 10 mg/  5 mL, chlorphenamine 4mg  / 5 mL with honey      promethazine 25 MG tablet   Commonly known as: PHENERGAN   Take 25 mg by mouth every 6 (six) hours as needed. For nausea      ranitidine 150 MG tablet   Commonly known as: ZANTAC   Take 150 mg by mouth at bedtime.      Red Yeast Rice 600 MG Caps   Take 1 capsule by mouth every morning.      SINGULAIR 10 MG tablet   Generic drug: montelukast   Take 10 mg by mouth at bedtime.      Vitamin D 2000 UNITS tablet   Take 2,000 Units by mouth every morning.      vitamin E 1000 UNIT capsule   Take 1,000 Units by mouth every morning.            Discharge Orders    Future  Appointments: Provider: Department: Dept Phone: Center:   05/08/2011 11:00 AM Vickki Hearing, MD [facility] [phone] [facility]   05/16/2011 3:15 PM Kalman Shan, MD [facility] [phone] None     Future Orders Please Complete By Expires   Diet - low sodium heart healthy      Activity as tolerated - No restrictions         Disposition: 01-Home or Self Care with home health nursing  Discharged Condition: Stable  Consults:   none  Labs:   Results for orders placed during the hospital encounter of 05/01/11 (from the past 48 hour(s))  STREP PNEUMONIAE URINARY ANTIGEN     Status: Normal   Collection Time   05/02/11 11:32 AM      Component Value Range Comment   Strep Pneumo Urinary Antigen NEGATIVE  NEGATIVE    CULTURE, SPUTUM-ASSESSMENT     Status: Normal   Collection Time   05/03/11  8:38 AM      Component Value Range Comment   Specimen Description SPUTUM      Special Requests NONE      Sputum evaluation        Value: MICROSCOPIC FINDINGS SUGGEST THAT THIS SPECIMEN IS NOT REPRESENTATIVE OF LOWER RESPIRATORY SECRETIONS. PLEASE RECOLLECT.     CRITICAL RESULT CALLED TO, READ BACK BY AND VERIFIED WITH:  SEHONWITZ, L. AT  10:35AM ON 05/03/11 BY PRUITT, C   Report Status 05/03/2011 FINAL       Diagnostics:  Dg Chest 2 View  05/01/2011  *RADIOLOGY REPORT*  Clinical Data: Cough and short of breath  CHEST - 2 VIEW  Comparison: 06/14/2010  Findings: Mild cardiomegaly.  Bronchitic changes.  Low volumes. Consolidation in the posterior basal segment of the left lower lobe.  No pneumothorax and no pleural effusion.  IMPRESSION: Left lower lobe consolidation.  Original Report Authenticated By: Donavan Burnet, M.D.    Hospital Course: See H&P for complete admission details. The patient is a 66 year old white female nonsmoker with history of COPD and asthma who was directly  admitted from her primary care physician's office with continued cough and wheezing despite several courses  of antibiotics and steroids over the past several months. She had reportedly been on several courses of a respiratory fluoroquinolone. Also, per her report, doxycycline, and several steroid bursts. At baseline, she is quite debilitated and unable to walk more than about 20 feet because of lung disease and arthritis.  Initially, she was coughing quite a bit with wheeze and diminished breath sounds. Workup was significant for left lower lobe pneumonia. She was started on ceftaroline and azithromycin. She reports multiple drug allergies, however many of these sound more like intolerances. She tolerated the antibiotics. She also started on prednisone and her bronchodilators were continued. By the time of discharge, her lungs were clear, her cough was diminished, and she was able to ambulate about the nursing unit. She is close to her functional baseline most likely. She reports a lifelong history of asthma. She has an appointment scheduled with Dr. Marchelle Gearing later on this month. She would likely benefit from pulmonary rehabilitation after resolution of her pneumonia symptoms. Her other medical problems remain stable. I'm sending her home with home health nursing to monitor her cardiopulmonary status. Total time on the day of discharge greater than 30 minutes.  Discharge Exam:  Blood pressure 132/70, pulse 67, temperature 97.5 F (36.4 C), temperature source Oral, resp. rate 18, height 5\' 2"  (1.575 m), weight 82.9 kg (182 lb 12.2 oz), SpO2 99.00%.  gen: Coughing less. Talkative. Lungs clear to auscultation bilaterally without wheeze rhonchi or rales Cardiovascular regular rate rhythm without murmurs gallops rubs Abdomen soft nontender nondistended Extremities no clubbing cyanosis or edema   Signed: Jazmine Longshore L 05/04/2011, 10:39 AM

## 2011-05-05 NOTE — Progress Notes (Signed)
Discharge summary sent to payer through [facility]  

## 2011-05-08 ENCOUNTER — Ambulatory Visit: Payer: Medicare Other | Admitting: Orthopedic Surgery

## 2011-05-08 LAB — CULTURE, RESPIRATORY W GRAM STAIN

## 2011-05-16 ENCOUNTER — Ambulatory Visit (INDEPENDENT_AMBULATORY_CARE_PROVIDER_SITE_OTHER): Payer: Medicare Other | Admitting: Internal Medicine

## 2011-05-16 ENCOUNTER — Encounter: Payer: Self-pay | Admitting: Internal Medicine

## 2011-05-16 ENCOUNTER — Telehealth: Payer: Self-pay | Admitting: Internal Medicine

## 2011-05-16 VITALS — BP 100/70 | HR 75 | Temp 97.8°F | Ht 62.0 in | Wt 183.4 lb

## 2011-05-16 DIAGNOSIS — J189 Pneumonia, unspecified organism: Secondary | ICD-10-CM

## 2011-05-16 DIAGNOSIS — R059 Cough, unspecified: Secondary | ICD-10-CM

## 2011-05-16 DIAGNOSIS — K219 Gastro-esophageal reflux disease without esophagitis: Secondary | ICD-10-CM

## 2011-05-16 DIAGNOSIS — R05 Cough: Secondary | ICD-10-CM

## 2011-05-16 NOTE — Patient Instructions (Signed)
#  Daily cough   - without control of sinus (per Dr Suszanne Conners) and acid reflux issues we cannot control cough  - please follow with Dr Suszanne Conners  - I think we will get another GI opinion - please see Dr Rhea Belton at [facility]. Nurse will make referral  #REcurrent pneumonia  - I am worried this might be related to your esohagus reflux issues  - Please have CT chest mid may 2013 without contrast  #Followup  - after CT chest mid may 2013

## 2011-05-16 NOTE — Progress Notes (Signed)
Subjective:    Patient ID: Krystal Powell, female    DOB: Jul 04, 1945, 66 y.o.   MRN: [id number]  HPI IOV 05/16/2011 [facility], [facility] is PMD  66 year old female. Body mass index is 33.54 kg/(m^2).  reports that she has never smoked. She does not have any smokeless tobacco history on file.  Reports recurrent lung infection x 20 months. Reports episodes of fever, green mucus, cough, wheezing, weakness. Rx with antibiotics and prednsione that helps temporarily but recurs after a few days of completing course. Even between episodes does not feel fully fine; still has some cough. Only 1 admission for this; 05/01/11 - 05/04/11 and CXR showed LLL consolidation. Never smoked though parents smoked as child. Gives hx of GERD and gives hx of esophageal dilatation 5-6 times (Dr Kendell Bane GI in [place]) and apparently refractory. Still with dysphagia - needs to take liquid to swallow food and has difficulty with large pills (cuts antibiotics into 4 pieces). She herself suspects she is aspirating food into lungs (gives hx of several episodes of coughing up food especially in last 20 months).   Overall main issue appears to be chronic daily cough. RSI cough score is 38 out of 40 and very c/w LPR cough. Describes Level 5 - post nasal drip, difficulty swallowing, breathing difficulty/choking episodies, annoying cough, sensation of something in throat, and hearburn. Level 4 - hoarseness of voice or cough after lying down  Recollects hx of PFT [facility] 2012 in [place] that reportedly showed copd. 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 Lived in [place] for 15 years for asthma. REcently saw DR Willa Rough allergist who she says was nervous to do allergy tests due to pulmonary hx.. Hx of sinus surger in [place] over 20 years ago. Saw DR Suszanne Conners in [facility] last year. Reports polyps and constant sinus drainage.     Dg Chest 2 View  05/01/2011  *RADIOLOGY REPORT*  Clinical  Data: Cough and short of breath  CHEST - 2 VIEW  Comparison: 06/14/2010  Findings: Mild cardiomegaly.  Bronchitic changes.  Low volumes. Consolidation in the posterior basal segment of the left lower lobe.  No pneumothorax and no pleural effusion.  IMPRESSION: Left lower lobe consolidation.  Original Report Authenticated By: Donavan Burnet, M.D.     Past Medical History  Diagnosis Date  . COPD (chronic obstructive pulmonary disease)   . Hypertension   . Asthma   . GERD (gastroesophageal reflux disease)   . Hiatal hernia   . S/P colonoscopy 03/02/2008    Dr Rourk->hyperplastic polyp, pancolonic diverticula  . S/P endoscopy 01/25/2008    Dr Rourk-> crk-screwing distal esophagus, Schatzki's ring dilated 23F, pyloric channel erosions  . Schatzki's ring   . Adenomatous polyps     Dr Gwenevere Abbot  . Spondylosis   . Hypercholesteremia   . Anxiety   . Hypothyroidism   . PONV (postoperative nausea and vomiting)   . Degenerative disc disease   . Chronic back pain   . Larynx disease     pt's left side if stiff and has protein deposits on it which causes her to lose air on that side and can't talk as well due to it     Family History  Problem Relation Age of Onset  . GER disease Mother   . Goiter Mother     malignant  . GER disease Father   . Anesthesia problems Neg Hx   .  Hypotension Neg Hx   . Malignant hyperthermia Neg Hx   . Pseudochol deficiency Neg Hx      History   Social History  . Marital Status: Married    Spouse Name: N/A    Number of Children: 3  . Years of Education: N/A   Occupational History  . disabled    Social History Main Topics  . Smoking status: Never Smoker   . Smokeless tobacco: Not on file  . Alcohol Use: No  . Drug Use: No  . Sexually Active: Yes    Birth Control/ Protection: Surgical   Other Topics Concern  . Not on file   Social History Narrative  . No narrative on file     Allergies  Allergen Reactions  . Sulfonamide Derivatives Nausea And  Vomiting and Other (See Comments)    Extreme gi upset  . Adhesive (Tape) Other (See Comments)    Blistering Paper tape does not cause a reaction, certain other types are fine to use. Please ask patient  . Augmentin Hives and Other (See Comments)    abdominal cramps   . Avelox (Moxifloxacin Hcl In Nacl) Itching  . Demeclocycline Hcl Other (See Comments)    Severe yeast infections and rash  . Iodinated Diagnostic Agents Nausea Only    dizziness  . Iodine Hives and Nausea And Vomiting  . Iohexol Other (See Comments)     Desc: per Dr. Luvenia Starch office/ pt has IVP dye allergy   . Thorazine (Chlorpromazine Hcl) Nausea And Vomiting and Other (See Comments)    Causes a feverish feeling from head to toe, hot flashes   . Codeine Rash and Other (See Comments)    Stops her heart (different things happen each time she takes it)     Outpatient Prescriptions Prior to Visit  Medication Sig Dispense Refill  . acetaminophen (TYLENOL) 500 MG tablet Take 1,000 mg by mouth 2 (two) times daily.        Marland Kitchen albuterol (PROVENTIL HFA;VENTOLIN HFA) 108 (90 BASE) MCG/ACT inhaler Inhale 2 puffs into the lungs every 6 (six) hours as needed. For shortness of breath       . ALPRAZolam (XANAX) 0.5 MG tablet Take 0.5 mg by mouth at bedtime.       . beclomethasone (BECONASE-AQ) 42 MCG/SPRAY nasal spray Place 2 sprays into the nose 2 (two) times daily.       . carvedilol (COREG) 6.25 MG tablet Take 6.25 mg by mouth 2 (two) times daily with a meal.       . CELEBREX 200 MG capsule Take 200 mg by mouth 2 (two) times daily.       . cetirizine (ZYRTEC) 10 MG tablet Take 10 mg by mouth daily.      . Cholecalciferol (VITAMIN D) 2000 UNITS tablet Take 2,000 Units by mouth every morning.        . Cyanocobalamin 2500 MCG TABS Take 1 tablet by mouth every morning.        . diphenhydrAMINE (BENADRYL) 25 MG tablet Take 25 mg by mouth at bedtime.      . fenofibrate 160 MG tablet Take 160 mg by mouth daily after breakfast.       .  fish oil-omega-3 fatty acids 1000 MG capsule Take 1 g by mouth every morning.       Marland Kitchen HYDROcodone-acetaminophen (NORCO) 7.5-325 MG per tablet Take 1 tablet by mouth every 6 (six) hours as needed. For pain      . ipratropium-albuterol (DUONEB)  0.5-2.5 (3) MG/3ML SOLN Take 3 mLs by nebulization 4 (four) times daily as needed. For shortness of breath      . levothyroxine (SYNTHROID, LEVOTHROID) 100 MCG tablet Take 100 mcg by mouth every morning.       . meclizine (ANTIVERT) 25 MG tablet Take 25 mg by mouth 3 (three) times daily as needed. For dizziness      . mometasone-formoterol (DULERA) 100-5 MCG/ACT AERO Inhale 2 puffs into the lungs 2 (two) times daily.      Marland Kitchen olmesartan-hydrochlorothiazide (BENICAR HCT) 40-12.5 MG per tablet Take 0.5 tablets by mouth daily.       Marland Kitchen omeprazole (PRILOSEC) 20 MG capsule Take 20 mg by mouth 2 (two) times daily.      . potassium chloride (K-DUR,KLOR-CON) 10 MEQ tablet Take 10 mEq by mouth daily.       Marland Kitchen PRESCRIPTION MEDICATION Take 5 mLs by mouth every 6 (six) hours as needed. [place] [facility] compound  Of hydrocodone 5 mg/ 5 mL, phenylephrine 10 mg/  5 mL, chlorphenamine 4mg  / 5 mL with honey       . promethazine (PHENERGAN) 25 MG tablet Take 25 mg by mouth every 6 (six) hours as needed. For nausea      . ranitidine (ZANTAC) 150 MG tablet Take 150 mg by mouth at bedtime.      . Red Yeast Rice 600 MG CAPS Take 1 capsule by mouth every morning.        Marland Kitchen SINGULAIR 10 MG tablet Take 10 mg by mouth at bedtime.       . vitamin E 1000 UNIT capsule Take 1,000 Units by mouth every morning.        Marland Kitchen azithromycin (ZITHROMAX) 250 MG tablet Take 1 tablet (250 mg total) by mouth daily.  2 each  0  . predniSONE (DELTASONE) 10 MG tablet 3 tablets daily for 3 days, then decrease by 1 tablet every 3 days until off  18 tablet  0          Review of Systems  Constitutional: Positive for unexpected weight change. Negative for fever.  HENT: Positive for ear pain, congestion,  sneezing and trouble swallowing. Negative for nosebleeds, sore throat, rhinorrhea, dental problem, postnasal drip and sinus pressure.   Eyes: Negative for redness and itching.  Respiratory: Positive for cough and shortness of breath. Negative for chest tightness and wheezing.   Cardiovascular: Positive for palpitations. Negative for leg swelling.  Gastrointestinal: Negative for nausea and vomiting.  Genitourinary: Negative for dysuria.  Musculoskeletal: Positive for joint swelling.  Skin: Negative for rash.  Neurological: Negative for headaches.  Hematological: Does not bruise/bleed easily.  Psychiatric/Behavioral: Negative for dysphoric mood. The patient is not nervous/anxious.        Objective:   Physical Exam  Vitals reviewed. Constitutional: She is oriented to person, place, and time. She appears well-developed and well-nourished. No distress.       Body mass index is 33.54 kg/(m^2).   HENT:  Head: Normocephalic and atraumatic.  Right Ear: External ear normal.  Left Ear: External ear normal.  Mouth/Throat: Oropharynx is clear and moist. No oropharyngeal exudate.       Post nasal drip +  Eyes: Conjunctivae and EOM are normal. Pupils are equal, round, and reactive to light. Right eye exhibits no discharge. Left eye exhibits no discharge. No scleral icterus.  Neck: Normal range of motion. Neck supple. No JVD present. No tracheal deviation present. No thyromegaly present.  Cardiovascular: Normal rate, regular rhythm,  normal heart sounds and intact distal pulses.  Exam reveals no gallop and no friction rub.   No murmur heard. Pulmonary/Chest: Effort normal and breath sounds normal. No respiratory distress. She has no wheezes. She has no rales. She exhibits no tenderness.  Abdominal: Soft. Bowel sounds are normal. She exhibits no distension and no mass. There is no tenderness. There is no rebound and no guarding.  Musculoskeletal: Normal range of motion. She exhibits no edema and no  tenderness.  Lymphadenopathy:    She has no cervical adenopathy.  Neurological: She is alert and oriented to person, place, and time. She has normal reflexes. No cranial nerve deficit. She exhibits normal muscle tone. Coordination normal.  Skin: Skin is warm and dry. No rash noted. She is not diaphoretic. No erythema. No pallor.  Psychiatric: She has a normal mood and affect. Her behavior is normal. Judgment and thought content normal.          Assessment & Plan:

## 2011-05-18 ENCOUNTER — Encounter: Payer: Self-pay | Admitting: Internal Medicine

## 2011-05-18 ENCOUNTER — Telehealth: Payer: Self-pay | Admitting: Internal Medicine

## 2011-05-18 DIAGNOSIS — J189 Pneumonia, unspecified organism: Secondary | ICD-10-CM | POA: Insufficient documentation

## 2011-05-18 DIAGNOSIS — R05 Cough: Secondary | ICD-10-CM | POA: Insufficient documentation

## 2011-05-18 NOTE — Assessment & Plan Note (Signed)
I think her recurrent respiratory infection is likely due to hx of overt aspiration. Will repeat CT chest Mid May 2013. Will get another GI opinion from DR J Pyrtle of [facility]

## 2011-05-18 NOTE — Assessment & Plan Note (Signed)
RSI cough score of 38 out of total socre 45 is very c/w LPR cough. I think sinus, GERD/aspiration are primary inciting factors. In addition she could have cyclical cough/VCD or undiagnosed asthma. However, we cannot proceed with managing her cough without clear handle on her complicated GI issue. Will refer to GI at [facility]; Dr Rhea Belton

## 2011-05-18 NOTE — Telephone Encounter (Signed)
Krystal Powell  I would like you to see this patient for 2nd opinion eval. Has recurent lung infection hx and strong aspiration story. Cough score suggests LPR cough.   Thanks  [employer]

## 2011-05-19 ENCOUNTER — Telehealth: Payer: Self-pay | Admitting: Gastroenterology

## 2011-05-19 ENCOUNTER — Encounter: Payer: Self-pay | Admitting: Internal Medicine

## 2011-05-19 NOTE — Telephone Encounter (Signed)
Dr Rhea Belton, will you accept the pt; Dr Kendell Bane is in [place]? Thanks.

## 2011-05-19 NOTE — Telephone Encounter (Signed)
Yes, I saw a consult note from Pulm, and sent it to [facility] to schedule.

## 2011-05-19 NOTE — Telephone Encounter (Signed)
[name], please schedule Krystal Powell for new pt slot.  Indication is cough: ? Reflux.

## 2011-05-19 NOTE — Telephone Encounter (Signed)
lvm for pt to call back to schedule appointment she is being referred by Dr. Marchelle Gearing.

## 2011-05-26 ENCOUNTER — Encounter: Payer: Self-pay | Admitting: Internal Medicine

## 2011-05-27 ENCOUNTER — Ambulatory Visit (INDEPENDENT_AMBULATORY_CARE_PROVIDER_SITE_OTHER): Payer: Medicare Other | Admitting: Internal Medicine

## 2011-05-27 ENCOUNTER — Encounter: Payer: Self-pay | Admitting: Internal Medicine

## 2011-05-27 VITALS — BP 140/84 | HR 80 | Ht 62.0 in | Wt 181.2 lb

## 2011-05-27 DIAGNOSIS — R1310 Dysphagia, unspecified: Secondary | ICD-10-CM

## 2011-05-27 DIAGNOSIS — Q391 Atresia of esophagus with tracheo-esophageal fistula: Secondary | ICD-10-CM

## 2011-05-27 DIAGNOSIS — K219 Gastro-esophageal reflux disease without esophagitis: Secondary | ICD-10-CM

## 2011-05-27 DIAGNOSIS — R1314 Dysphagia, pharyngoesophageal phase: Secondary | ICD-10-CM

## 2011-05-27 DIAGNOSIS — K222 Esophageal obstruction: Secondary | ICD-10-CM

## 2011-05-27 DIAGNOSIS — J690 Pneumonitis due to inhalation of food and vomit: Secondary | ICD-10-CM

## 2011-05-27 DIAGNOSIS — Z86010 Personal history of colonic polyps: Secondary | ICD-10-CM

## 2011-05-27 MED ORDER — PANTOPRAZOLE SODIUM 40 MG PO TBEC: 40.0000 mg | DELAYED_RELEASE_TABLET | Freq: Two times a day (BID) | ORAL | Status: DC

## 2011-05-27 NOTE — Patient Instructions (Signed)
You have been scheduled for an esophogeal Manometry on 06/09/2011 @ 9am. Report to [facility] outpatient at 8:45am. If you need to rescheduled this appointment please call [phone]  Speech pathology will call you today to schedule you referral. This appointment will need to be before 06/09/2011.   We have sent the following medications to your pharmacy for you to pick up at your convenience: Protonix

## 2011-05-27 NOTE — Progress Notes (Signed)
Subjective:    Patient ID: Krystal Powell, female    DOB: Apr 24, 1945, 66 y.o.   MRN: [id number]  HPI Krystal Powell is a 66 year old female with a past medical history of GERD, just is ring status post dilation in November 2012, recurrent pneumonia and chronic cough felt secondary to aspiration and esophageal reflux, hypertension, hyperlipidemia, hypothyroidism, who seen in consultation at the request of Dr. Marchelle Gearing for evaluation of reflux disease. The patient has had ongoing trouble with swallowing and acid reflux for years. She remembers at least 20 years of acid reflux symptoms. Her last 20 months she has been struggling with recurrent infections, including pneumonia. This required hospitalization from April 4 - April 7 of this year. She has tried multiple medications for acid reflux, and is currently on omeprazole 20 mg twice a day. She takes ranitidine 150 mg at bedtime. She was given a prescription for AcipHex, which she felt worked better, but she was unable to afford this medication due to expense. She has tried other PPIs in the past including Prevacid. She continues to report nocturnal heartburn and acidic waterbrash. She often wakes up at night coughing, and needing to clear her throat. She has tried sleeping on the wedge, but this led to poor sleep, though she did note some improvement with this. She does try to avoid eating and drinking after 6 PM. She is currently taking her second dose of omeprazole at about 9 PM.  Separate from her reflux, she does continue to report dysphagia. She's had at least 2 esophageal dilations, last November 2012. She is dilated with a 81 [place], after which she reports no improvement in her dysphagia. Her dysphagia is mainly for foods such as meats and breads. She has no trouble with liquids. Separate from this, she reports feeling as if she "strangles on my saliva". This sensation happens before the food reaches her esophagus. She also reports this has been  present for years. No nausea or vomiting. No weight loss. No change in bowel habits. No melena or bright red blood per rectum. No fevers or chills.   Review of Systems As per history of present illness, otherwise negative  Patient Active Problem List  Diagnoses  . TEAR MEDIAL MENISCUS  . Loose body in joint  . Arthritis of knee  . LPRD (laryngopharyngeal reflux disease)  . Dysphagia  . COPD exacerbation  . Cough  . Hypertension  . Hypothyroidism  . Obesity  . GERD (gastroesophageal reflux disease)  . Hyperlipidemia  . CAP (community acquired pneumonia)  . Hypokalemia  . Chronic cough  . Recurrent pneumonia   Current Outpatient Prescriptions  Medication Sig Dispense Refill  . acetaminophen (TYLENOL) 500 MG tablet Take 1,000 mg by mouth 2 (two) times daily.        Marland Kitchen albuterol (PROVENTIL HFA;VENTOLIN HFA) 108 (90 BASE) MCG/ACT inhaler Inhale 2 puffs into the lungs every 6 (six) hours as needed. For shortness of breath       . ALPRAZolam (XANAX) 0.5 MG tablet Take 0.5 mg by mouth at bedtime.       . beclomethasone (BECONASE-AQ) 42 MCG/SPRAY nasal spray Place 2 sprays into the nose 2 (two) times daily.       . carvedilol (COREG) 6.25 MG tablet Take 6.25 mg by mouth 2 (two) times daily with a meal.       . CELEBREX 200 MG capsule Take 200 mg by mouth 2 (two) times daily.       . cetirizine (ZYRTEC)  10 MG tablet Take 10 mg by mouth daily.      . Cholecalciferol (VITAMIN D) 2000 UNITS tablet Take 2,000 Units by mouth every morning.        . Cyanocobalamin 2500 MCG TABS Take 1 tablet by mouth every morning.        . diphenhydrAMINE (BENADRYL) 25 MG tablet Take 25 mg by mouth at bedtime.      . fenofibrate 160 MG tablet Take 160 mg by mouth daily after breakfast.       . fish oil-omega-3 fatty acids 1000 MG capsule Take 1 g by mouth every morning.       Marland Kitchen HYDROcodone-acetaminophen (NORCO) 7.5-325 MG per tablet Take 1 tablet by mouth every 6 (six) hours as needed. For pain      .  ipratropium-albuterol (DUONEB) 0.5-2.5 (3) MG/3ML SOLN Take 3 mLs by nebulization 4 (four) times daily as needed. For shortness of breath      . levothyroxine (SYNTHROID, LEVOTHROID) 100 MCG tablet Take 100 mcg by mouth every morning.       . meclizine (ANTIVERT) 25 MG tablet Take 25 mg by mouth 3 (three) times daily as needed. For dizziness      . mometasone-formoterol (DULERA) 100-5 MCG/ACT AERO Inhale 2 puffs into the lungs 2 (two) times daily.      Marland Kitchen olmesartan-hydrochlorothiazide (BENICAR HCT) 40-12.5 MG per tablet Take 0.5 tablets by mouth daily.       . potassium chloride (K-DUR,KLOR-CON) 10 MEQ tablet Take 10 mEq by mouth daily.       Marland Kitchen PRESCRIPTION MEDICATION Take 5 mLs by mouth every 6 (six) hours as needed. [place] [facility]  Of hydrocodone 5 mg/ 5 mL, phenylephrine 10 mg/  5 mL, chlorphenamine 4mg  / 5 mL with honey       . promethazine (PHENERGAN) 25 MG tablet Take 25 mg by mouth every 6 (six) hours as needed. For nausea      . ranitidine (ZANTAC) 150 MG tablet Take 150 mg by mouth at bedtime.      . Red Yeast Rice 600 MG CAPS Take 1 capsule by mouth every morning.        Marland Kitchen SINGULAIR 10 MG tablet Take 10 mg by mouth at bedtime.       . vitamin E 1000 UNIT capsule Take 1,000 Units by mouth every morning.        . pantoprazole (PROTONIX) 40 MG tablet Take 1 tablet (40 mg total) by mouth 2 (two) times daily.  30 tablet  6   Allergies  Allergen Reactions  . Sulfonamide Derivatives Nausea And Vomiting and Other (See Comments)    Extreme gi upset  . Adhesive (Tape) Other (See Comments)    Blistering Paper tape does not cause a reaction, certain other types are fine to use. Please ask patient  . Amoxicillin-Pot Clavulanate Hives and Other (See Comments)    abdominal cramps   . Avelox (Moxifloxacin Hcl In Nacl) Itching  . Demeclocycline Hcl Other (See Comments)    Severe yeast infections and rash  . Iodinated Diagnostic Agents Nausea Only    dizziness  . Iodine Hives and  Nausea And Vomiting  . Iohexol Other (See Comments)     Desc: per Dr. Luvenia Starch office/ pt has IVP dye allergy   . Thorazine (Chlorpromazine Hcl) Nausea And Vomiting and Other (See Comments)    Causes a feverish feeling from head to toe, hot flashes   . Codeine Rash and Other (See Comments)  Stops her heart (different things happen each time she takes it)   Family History  Problem Relation Age of Onset  . GER disease Mother   . Goiter Mother     malignant  . GER disease Father   . Anesthesia problems Neg Hx   . Malignant hyperthermia Neg Hx   . Pseudochol deficiency Neg Hx   . Colon polyps Daughter   . Colon polyps Son   . Diabetes Sister   . Diabetes Brother   . Heart disease Mother   . Heart disease Father   . Heart disease Brother   . Irritable bowel syndrome Mother    History  Substance Use Topics  . Smoking status: Never Smoker   . Smokeless tobacco: Never Used  . Alcohol Use: No      Objective:   Physical Exam BP 140/84  Pulse 80  Ht 5\' 2"  (1.575 m)  Wt 181 lb 4 oz (82.214 kg)  BMI 33.15 kg/m2 Constitutional: Well-developed and well-nourished. No distress. HEENT: Normocephalic and atraumatic. Oropharynx is clear and moist. No oropharyngeal exudate. Conjunctivae are normal. Pupils are equal round and reactive to light. No scleral icterus. Neck: Neck supple. Trachea midline. Cardiovascular: Normal rate, regular rhythm and intact distal pulses. No M/R/G Pulmonary/chest: Effort normal and breath sounds normal. No wheezing, rales or rhonchi. Abdominal: Soft, nontender, nondistended. Bowel sounds active throughout. There are no masses palpable. No hepatosplenomegaly. Extremities: no clubbing, cyanosis, with trace pretibial Lymphadenopathy: No cervical adenopathy noted. Neurological: Alert and oriented to person place and time. Skin: Skin is warm and dry. No rashes noted. Psychiatric: Normal mood and affect. Behavior is normal.  CBC    Component Value Date/Time     WBC 9.5 05/01/2011 1901   RBC 4.08 05/01/2011 1901   HGB 12.1 05/01/2011 1901   HCT 36.5 05/01/2011 1901   PLT 286 05/01/2011 1901   MCV 89.5 05/01/2011 1901   MCH 29.7 05/01/2011 1901   MCHC 33.2 05/01/2011 1901   RDW 12.6 05/01/2011 1901   LYMPHSABS 1.9 05/01/2011 1901   MONOABS 1.2* 05/01/2011 1901   EOSABS 0.7 05/01/2011 1901   BASOSABS 0.0 05/01/2011 1901    CMP     Component Value Date/Time   NA 140 05/01/2011 1901   K 3.4* 05/01/2011 1901   CL 100 05/01/2011 1901   CO2 31 05/01/2011 1901   GLUCOSE 103* 05/01/2011 1901   BUN 24* 05/01/2011 1901   CREATININE 1.27* 05/01/2011 1901   CALCIUM 10.3 05/01/2011 1901   PROT 7.2 05/01/2011 1901   ALBUMIN 3.0* 05/01/2011 1901   AST 19 05/01/2011 1901   ALT 11 05/01/2011 1901   ALKPHOS 64 05/01/2011 1901   BILITOT 0.7 05/01/2011 1901   GFRNONAA 43* 05/01/2011 1901   GFRAA 50* 05/01/2011 1901   EGD, 12/18/2010, Dr. Roetta Sessions -- poor visualization of the vocal cords, some irregularity noted. Noncritical Schatzki's ring, otherwise normal esophagus. Tiny 1-2 cm hiatal hernia. Bilobed 5 mm gastric polyp. Patent pylorus. Normal first and second portion of the duodenum. 56 French Maloney dilator passed the full insertion easily. No competition seen on the back. Subsequently, biopsies of the gastric polyp taken. Pathology, fundic gland polyp. No evidence of Helicobacter pylori, intestinal metaplasia, dysplasia or malignancy.  Colonoscopy, 03/02/2008, Dr. Roetta Sessions -- exam to the cecum with good prep. Pancolonic diverticulosis, scattered. 6 mm splenic flexure polyp. Pathology = hyperplastic polyp.     Assessment & Plan:  Krystal Powell is a 66 year old female with a past medical history  of GERD, just is ring status post dilation in November 2012, recurrent pneumonia and chronic cough felt secondary to aspiration and esophageal reflux, hypertension, hyperlipidemia, hypothyroidism, who seen in consultation at the request of Dr. Marchelle Gearing for evaluation of reflux disease with hx of  aspiration PNA  1. GERD/hx of Schatzki's ring/dysphagia -- the patient's issues with swallowing and acid reflux are long-standing and somewhat complicated. It appears that her issues are somewhat separate. From a GERD standpoint, it seems that she is still having some issues with acid reflux, as she has ascitic water brash and heartburn symptoms. This is somewhat surprising on twice daily omeprazole with each bedtime ranitidine, however I do not think she is optimally timing her PPI doses. Would like to start by switching her to pantoprazole, a more potent PPI, with 40 mg twice a day. I like her to take her first and second doses 30 minutes to one hour before breakfast and dinner, respectively. She can continue to use each bedtime ranitidine 150 mg as needed. We have discussed at length, how PPIs help with acid reflux, but not nonacid reflux. Some of her issue, will likely continue to be reflux, but hopefully we will be able to better control her acid. We discussed GERD hygiene, including propping the head of her bed if possible. Also avoiding food and fluids within 2 hours of lying down.  Secondly, asked her dysphagia. She does have a known Schatzki's ring, but this was dilated to over 18 mm in November, with no improvement in her dysphagia symptoms. This is somewhat surprising, as the ring should not be problematic after dilation to this size. This raises the question of an esophageal motility disorder. To this regard, I would like to perform esophageal manometry to ensure normal esophageal function. This would be necessary prior to consideration of an anti-reflux surgery such as a Nissen fundoplication. If the manometry is normal, then repeat EGD for biopsies to rule out eosinophilic esophagitis can be considered. To my knowledge random esophageal biopsies have not been performed.  Finally, she raises the issue of possible aspiration when swallowing. For this, I recommended a speech and swallow evaluation  by speech pathology. This will help evaluate swallowing before food boluses reach her esophagus. Possibly dietary modifications can be made here, to avoid aspiration.  I will see her back after the above studies have been performed. We did discuss Nissen fundoplication, but for now the above workup needs to be completed first. We did discuss pH/impedence testing, but it present this is not being performed in our community. If this test is needed later, she could be referred to [facility] or [facility] for this test.  2. History of adenomatous colon polyps -- the patient's last colonoscopy was in February 2010, revealing only hyperplastic polyps. Therefore based on current guidelines, repeat would be recommended in February 2015

## 2011-05-27 NOTE — Progress Notes (Signed)
Addended by: Adonis Housekeeper A on: 05/27/2011 09:47 AM   Modules accepted: Orders

## 2011-06-03 ENCOUNTER — Telehealth: Payer: Self-pay | Admitting: Internal Medicine

## 2011-06-03 NOTE — Telephone Encounter (Signed)
I spoke with pt and she c/o runny nose, some wheezing, nasal congestion, sneezing x Sunday. Pt requesting an apt. Nothing available today but pt was fine with being seen tomorrow morning by TP at 9:30. Pt aware to seek emergency care if she worsens. Nothing further was needed

## 2011-06-04 ENCOUNTER — Encounter: Payer: Self-pay | Admitting: Adult Health

## 2011-06-04 ENCOUNTER — Other Ambulatory Visit: Payer: Self-pay | Admitting: Internal Medicine

## 2011-06-04 ENCOUNTER — Telehealth: Payer: Self-pay | Admitting: Internal Medicine

## 2011-06-04 ENCOUNTER — Ambulatory Visit (INDEPENDENT_AMBULATORY_CARE_PROVIDER_SITE_OTHER): Payer: Medicare Other | Admitting: Adult Health

## 2011-06-04 VITALS — BP 144/86 | HR 77 | Temp 97.1°F | Ht 62.0 in | Wt 186.6 lb

## 2011-06-04 DIAGNOSIS — J45909 Unspecified asthma, uncomplicated: Secondary | ICD-10-CM

## 2011-06-04 MED ORDER — CEFDINIR 300 MG PO CAPS: 300.0000 mg | ORAL_CAPSULE | Freq: Two times a day (BID) | ORAL | Status: AC

## 2011-06-04 MED ORDER — PREDNISONE 10 MG PO TABS: ORAL_TABLET | ORAL | Status: DC

## 2011-06-04 NOTE — Progress Notes (Signed)
Subjective:    Patient ID: Krystal Powell, female    DOB: 01/27/1946, 66 y.o.   MRN: [id number]  HPI 65 yo female seen for initial pulmonary consult 05/16/11 for recurrent lung infection x 20 mon.   05/16/11 Consult  Reports recurrent lung infection x 20 months. Reports episodes of fever, green mucus, cough, wheezing, weakness. Rx with antibiotics and prednsione that helps temporarily but recurs after a few days of completing course. Even between episodes does not feel fully fine; still has some cough. Only 1 admission for this; 05/01/11 - 05/04/11 and CXR showed LLL consolidation. Never smoked though parents smoked as child. Gives hx of GERD and gives hx of esophageal dilatation 5-6 times (Dr Kendell [facility] in [place]) and apparently refractory. Still with dysphagia - needs to take liquid to swallow food and has difficulty with large pills (cuts antibiotics into 4 pieces). She herself suspects she is aspirating food into lungs (gives hx of several episodes of coughing up food especially in last 20 months).   Overall main issue appears to be chronic daily cough. RSI cough score is 38 out of 40 and very c/w LPR cough. Describes Level 5 - post nasal drip, difficulty swallowing, breathing difficulty/choking episodies, annoying cough, sensation of something in throat, and hearburn. Level 4 - hoarseness of voice or cough after lying down  Recollects hx of PFT [facility] 2012 [place] that reportedly showed copd. 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 Lived in [place] for 15 years for asthma. REcently saw DR Willa Rough allergist who she says was nervous to do allergy tests due to pulmonary hx.. Hx of sinus surger in [place] over 20 years ago. Saw DR Suszanne Conners in [facility] last year. Reports polyps and constant sinus drainage.   06/04/2011 Acute OV c/o chillis, fever, congestion, cough with green mucus, runny nose, and pain in her lungs for last 4 days.  Lots of  nasal drainage. Increased wheezing. OTC not helping. Feels bad with no energy.  Has CT chest planned next week. No hemoptysis, chest pain , no edema. Last ABX 1 month ago.  Recent trip to [place] in last couple of weeks.    Review of Systems Constitutional:   No  weight loss, night sweats,  Fevers, chills, fatigue, or  lassitude.  HEENT:   No headaches,  Difficulty swallowing,  Tooth/dental problems, or  Sore throat,                No sneezing, itching, ear ache, nasal congestion, post nasal drip,   CV:  No chest pain,  Orthopnea, PND, swelling in lower extremities, anasarca, dizziness, palpitations, syncope.   GI  No heartburn, indigestion, abdominal pain, nausea, vomiting, diarrhea, change in bowel habits, loss of appetite, bloody stools.   Resp: No shortness of breath with exertion or at rest.  No excess mucus, no productive cough,  No non-productive cough,  No coughing up of blood.  No change in color of mucus.  No wheezing.  No chest wall deformity  Skin: no rash or lesions.  GU: no dysuria, change in color of urine, no urgency or frequency.  No flank pain, no hematuria   MS:  No joint pain or swelling.  No decreased range of motion.  No back pain.  Psych:  No change in mood or affect. No depression or anxiety.  No memory loss.         Objective:  Physical Exam GEN: A/Ox3; pleasant , NAD, well nourished   HEENT:  Micro/AT,  EACs-clear, TMs-wnl, NOSE-clear, THROAT-clear, no lesions, no postnasal drip or exudate noted.   NECK:  Supple w/ fair ROM; no JVD; normal carotid impulses w/o bruits; no thyromegaly or nodules palpated; no lymphadenopathy.  RESP  Clear  P & A; w/o, wheezes/ rales/ or rhonchi.no accessory muscle use, no dullness to percussion  CARD:  RRR, no m/r/g  , no peripheral edema, pulses intact, no cyanosis or clubbing.  GI:   Soft & nt; nml bowel sounds; no organomegaly or masses detected.  Musco: Warm bil, no deformities or joint swelling noted.   Neuro:  alert, no focal deficits noted.    Skin: Warm, no lesions or rashes         Assessment & Plan:

## 2011-06-04 NOTE — Telephone Encounter (Signed)
Baldo Ash, Speech Pathologist with [facility] who discussed the referral process with me and offered to help with scheduling pt. Explained we have called [facility] and hopefully, they can work pt in. We will call him back if we need him.

## 2011-06-04 NOTE — Patient Instructions (Signed)
Hold fish Oil for 1 month then place in freezer Omnicef 300 mg twice daily for 7 days, take with food. Mucinex DM twice daily as needed for cough and congestion. Saline nasal rinses as needed. Fluids and rest. Prednisone taper for the next week. Followup as planned for CT scan next week. follow up Dr. Marchelle Gearing in 2 weeks as planned  Please contact office for sooner follow up if symptoms do not improve or worsen or seek emergency care

## 2011-06-04 NOTE — Telephone Encounter (Signed)
Called speech to ask why no one called the pt back and they stated pt needs a MBSS 1st before an evaluation by a therapist. I asked why no one called to tell us this and the reply was we are still on the work queue and no one has gotten to our referral. Spoke with pt to let us know of this and she asked if this could be done in [place]. Spoke with [place] in rehab at [facility] and she gave me a number to [facility], [phone]. Spoke with Angelica Chessman and she will schedule the pt for the MBSS and Speech eval if poosible at [facility]; she will also call the pt to inform her and let me know if I need to r/s the EM.

## 2011-06-04 NOTE — Assessment & Plan Note (Signed)
Exacerbation   Plan:  Hold fish Oil for 1 month then place in freezer Omnicef 300 mg twice daily for 7 days, take with food. Mucinex DM twice daily as needed for cough and congestion. Saline nasal rinses as needed. Fluids and rest. Prednisone taper for the next week. Followup as planned for CT scan next week. follow up Dr. Marchelle Gearing in 2 weeks as planned  Please contact office for sooner follow up if symptoms do not improve or worsen or seek emergency care

## 2011-06-05 ENCOUNTER — Ambulatory Visit (HOSPITAL_COMMUNITY)
Admission: RE | Admit: 2011-06-05 | Discharge: 2011-06-05 | Disposition: A | Discharge status: Home / Self Care | Payer: Medicare Other | Source: Ambulatory Visit | Attending: Internal Medicine | Admitting: Internal Medicine

## 2011-06-05 ENCOUNTER — Other Ambulatory Visit: Payer: Self-pay | Admitting: Internal Medicine

## 2011-06-05 DIAGNOSIS — R1310 Dysphagia, unspecified: Secondary | ICD-10-CM | POA: Insufficient documentation

## 2011-06-05 DIAGNOSIS — IMO0001 Reserved for inherently not codable concepts without codable children: Secondary | ICD-10-CM | POA: Insufficient documentation

## 2011-06-05 DIAGNOSIS — J449 Chronic obstructive pulmonary disease, unspecified: Secondary | ICD-10-CM | POA: Insufficient documentation

## 2011-06-05 DIAGNOSIS — E119 Type 2 diabetes mellitus without complications: Secondary | ICD-10-CM | POA: Insufficient documentation

## 2011-06-05 DIAGNOSIS — J4489 Other specified chronic obstructive pulmonary disease: Secondary | ICD-10-CM | POA: Insufficient documentation

## 2011-06-05 DIAGNOSIS — I10 Essential (primary) hypertension: Secondary | ICD-10-CM | POA: Insufficient documentation

## 2011-06-05 NOTE — Telephone Encounter (Signed)
Pt is scheduled for her studies today at [facility]. Asked her to have the SP let me know if she still needs the EM; pt stated understanding.

## 2011-06-05 NOTE — Procedures (Signed)
Objective Swallowing Evaluation: Modified Barium Swallowing Study  Patient Details  Name: Krystal Powell MRN: [id number] Date of Birth: 12-Dec-1945  Today's Date: 06/05/2011 Time:  - 15:33    Past Medical History:  Past Medical History  Diagnosis Date  . COPD (chronic obstructive pulmonary disease)   . Hypertension   . Asthma   . GERD (gastroesophageal reflux disease)   . Hiatal hernia   . Schatzki's ring   . Adenomatous polyps     Dr Gwenevere Abbot  . Spondylosis   . Hypercholesteremia   . Anxiety   . Hypothyroidism   . PONV (postoperative nausea and vomiting)   . Degenerative disc disease   . Chronic back pain   . Larynx disease     pt's left side if stiff and has protein deposits on it which causes her to lose air on that side and can't talk as well due to it  . Arthritis   . DM (diabetes mellitus)     when on prednisone  . Status post dilation of esophageal narrowing   . Fibromyalgia   . History of gallstones   . IBS (irritable bowel syndrome)   . History of pneumonia    Past Surgical History:  Past Surgical History  Procedure Date  . Knee surgery     multiple knee arthroscopies  . Tonsillectomy and adenoidectomy   . Bladder suspension   . Partial hysterectomy   . Appendectomy   . Cholecystectomy   . Umbilical hernia repair   . Laparoscopy   . Carotid endarterectomy     right  . Sinus exploration     x 2  . Shoulder surgery     right  . Foot surgery     right  . Maloney dilation 12/18/2010    Procedure: [name] DILATION;  Surgeon: Corbin Ade, MD;  Location: [facility];  Service: Endoscopy;  Laterality: N/A;  #56 dilator  . Abdominal hysterectomy     partial  . Tubal ligation   . Cataract extraction w/phaco 03/24/2011    Procedure: CATARACT EXTRACTION PHACO AND INTRAOCULAR LENS PLACEMENT (IOC);  Surgeon: [name], MD;  Location: [facility];  Service: Ophthalmology;  Laterality: Left;  CDE 15.27  . Cataract extraction w/phaco 04/03/2011    Procedure: CATARACT  EXTRACTION PHACO AND INTRAOCULAR LENS PLACEMENT (IOC);  Surgeon: [name], MD;  Location: [facility];  Service: Ophthalmology;  Laterality: Right;  CDE:7.41  . Breast excisional biopsy     left, x 3-4  . Breast excisional biopsy     right  . Larynx surgery     polyp removed   HPI:  Mrs. Krystal Powell is 66 year old woman who was referred by Dr. Erick Blinks for MBSS due to suspicion of aspiration pneumonia. The patient reports to me that she developed a staph infection after a knee procedure and about that time began experiencing water brash. She saw Dr. Jena Gauss in November 2012 for esophageal dilation for a small (and likely clinically insignificant) Schatzki's ring. She felt no relief in symptoms after dilation. In March she had a polyp removed from her larynx by Dr. Deland Pretty at [facility]. She also reports that she had "yellow deposits" removed from her thoat at the same time. In April of 2013, she was admitted to [facility] for a few days with COPD exacerbation and chest x-ray showed lower left lobe consolidation. She eventually went to see Dr. Marchelle Gearing for evaluation of reflux who then referred her to Dr. Rhea Belton. Pt has  a history of GERD x 20 years and she scored RSI cough score is 38 out of 40 and very c/w LPR cough. The patient also tells me that she has Sjogren's Syndrome which contributes to her xerostomia and raises the question of esophageal dysmotility. She has some difficulty swallowing pills and crackers and requires liquid wash to facilitate clearance.The patient has not yet had manometry, but is scheduled for it next week. She also has not had pH/impedence testing, but that she was supposed to do that at [facility]. She has concerns that she may be aspirating when she experiences water brash.    Symptoms/Limitations Symptoms: "I have had a cough for several months." Special Tests: MBSS  Recommendation/Prognosis  Clinical Impression Dysphagia Diagnosis: Within Functional Limits  Clinical  impression: Overall WFL to mild oral phase dysphagia likely negatively impacted by xerostomia with prolonged oral prep and need for effortful swallow to clear some solids. Pt has premature spillage with thin liquids to the valleculae and swallow trigger after filling the valleculae. She has min residuals of thin in the valleculae, pyriforms, and lateral channels due to pooling of lingual residuals, however secondary swallow clears and no penetration or aspiration occurred.  The patient presents with a complex medical picture which appears to be most related to pharyngo esophageal reflux, athough not witnessed today. I do not see any documentation in the patient's chart pertaining to Sjrogen's Syndrome (whether primary or secondary), but this should be taken into consideration as well (? possible impaired parasympathetic fxn). Esophageal manometry may be useful in identifying quantitative pressure data, detecting changes in reduction in amplitude (already scheduled for next week). No further SLP services are indicated at this time, as pt acknowledges understanding of GERD precautions and compensates for xerostomia as best as she can. Reconsult prn.  Swallow Evaluation Recommendations Recommended Consults: Consider esophageal assessment (Pt to follow up with Dr. Rhea Belton) Diet Recommendations: Regular;Thin liquid (self regulated 'regular' textures) Liquid Administration via: Straw;Cup Medication Administration: Whole meds with liquid Supervision: Patient able to self feed Postural Changes and/or Swallow Maneuvers: Seated upright 90 degrees;Upright 30-60 min after meal Oral Care Recommendations: Oral care BID;Patient independent with oral care Follow up Recommendations: None   Individuals Consulted Consulted and Agree with Results and Recommendations: Patient Report Sent to : Referring physician;Other (comment) (PCP: Hughie Closs)  General:  Date of Onset: 03/25/11 HPI: Mrs. Krystal Powell is 67 year old  woman who was referred by Dr. Erick Blinks for MBSS due to suspicion of aspiration pneumonia. The patient reports to me that she developed a staph infection after a knee procedure and about that time began experiencing water brash. She saw Dr. Jena Gauss in November 2012 for esophageal dilation for a small (and likely clinically insignificant) Schatzki's ring. She felt no relief in symptoms after dilation. In March she had a polyp removed from her larynx by Dr. Deland Pretty at [facility]. She also reports that she had "yellow deposits" removed from her thoat at the same time. In April of 2013, she was admitted to [facility] for a few days with COPD exacerbation and chest x-ray showed lower left lobe consolidation. She eventually went to see Dr. Marchelle Gearing for evaluation of reflux who then referred her to Dr. Rhea Belton. Pt has a history of GERD x 20 years and she scored RSI cough score is 38 out of 40 and very c/w LPR cough. The patient also tells me that she has Sjogren's Syndrome which contributes to her xerostomia and raises the question of esophageal dysmotility. She  has some difficulty swallowing pills and crackers and requires liquid wash to facilitate clearance.The patient has not yet had manometry, but is scheduled for it next week. She also has not had pH/impedence testing, but that she was supposed to do that at [facility]. She has concerns that she may be aspirating when she experiences water brash.   Type of Study: Modified Barium Swallowing Study Previous Swallow Assessment: N/A Diet Prior to this Study: Dysphagia 3 (soft);Thin liquids Temperature Spikes Noted: No Respiratory Status: Room air History of Intubation: No (not recently) Behavior/Cognition: Alert;Cooperative Oral Cavity - Dentition: Adequate natural dentition Oral Motor / Sensory Function: Within functional limits Vision: Functional for self-feeding Patient Positioning: Upright in chair Baseline Vocal Quality: Clear;Hoarse Volitional Cough:  Strong Volitional Swallow: Able to elicit Anatomy: Within functional limits Pharyngeal Secretions: Not observed secondary MBS  Reason for Referral:  Objectively evaluate swallow function due to concerns of possible aspiration and identify appropriate diet and compensatory strategies as needed.  Oral Phase Oral Preparation/Oral Phase Oral Phase: Impaired Oral - Solids Oral - Regular: Piecemeal swallowing;Delayed oral transit Oral Phase - Comment Oral Phase - Comment: Pt benefits from liquid wash due to xerostomia.  Pharyngeal Phase  Pharyngeal Phase Pharyngeal Phase: Impaired Pharyngeal - Thin Pharyngeal - Thin Cup: Premature spillage to valleculae;Premature spillage to pyriform;Pharyngeal residue - valleculae;Pharyngeal residue - pyriform;Lateral channel residue;Compensatory strategies attempted (Comment) (trace residuals of thin after the swallow clears w/ 2 swallo)  Cervical Esophageal Phase  Cervical Esophageal Phase Cervical Esophageal Phase: [facility]  Thank you,  Havery Moros, CCC-SLP [phone]  Jacaria Colburn 06/05/2011, 6:25 PM

## 2011-06-06 NOTE — Telephone Encounter (Signed)
Late entry 06/05/11  Received a call from Havery Moros, SP at [facility] called and she will fax [place] a report from MBSS done 06/05/11. She didn't really see any motility problems and suggested pt do the EM. Informed pt to keep her EM appt on 06/09/11; pt stated understanding.

## 2011-06-09 ENCOUNTER — Encounter (HOSPITAL_COMMUNITY)
Admission: RE | Disposition: A | Discharge status: Home / Self Care | Payer: Self-pay | Source: Ambulatory Visit | Attending: Internal Medicine

## 2011-06-09 ENCOUNTER — Ambulatory Visit (HOSPITAL_COMMUNITY)
Admission: RE | Admit: 2011-06-09 | Discharge: 2011-06-09 | Disposition: A | Discharge status: Home / Self Care | Payer: Medicare Other | Source: Ambulatory Visit | Attending: Internal Medicine | Admitting: Internal Medicine

## 2011-06-09 DIAGNOSIS — R1310 Dysphagia, unspecified: Secondary | ICD-10-CM | POA: Insufficient documentation

## 2011-06-09 HISTORY — PX: ESOPHAGEAL MANOMETRY: SHX5429

## 2011-06-09 SURGERY — MANOMETRY, ESOPHAGUS
Anesthesia: Choice

## 2011-06-09 MED ORDER — LIDOCAINE VISCOUS 2 % MT SOLN
OROMUCOSAL | Status: AC
  Filled 2011-06-09: qty 15

## 2011-06-10 ENCOUNTER — Encounter (HOSPITAL_COMMUNITY): Payer: Self-pay | Admitting: Internal Medicine

## 2011-06-10 ENCOUNTER — Ambulatory Visit (HOSPITAL_COMMUNITY)
Admission: RE | Admit: 2011-06-10 | Discharge: 2011-06-10 | Disposition: A | Discharge status: Home / Self Care | Payer: Medicare Other | Source: Ambulatory Visit | Attending: Internal Medicine | Admitting: Internal Medicine

## 2011-06-10 DIAGNOSIS — J189 Pneumonia, unspecified organism: Secondary | ICD-10-CM | POA: Insufficient documentation

## 2011-06-10 DIAGNOSIS — R05 Cough: Secondary | ICD-10-CM | POA: Insufficient documentation

## 2011-06-10 DIAGNOSIS — R059 Cough, unspecified: Secondary | ICD-10-CM | POA: Insufficient documentation

## 2011-06-10 DIAGNOSIS — R918 Other nonspecific abnormal finding of lung field: Secondary | ICD-10-CM | POA: Insufficient documentation

## 2011-06-17 ENCOUNTER — Ambulatory Visit (INDEPENDENT_AMBULATORY_CARE_PROVIDER_SITE_OTHER): Payer: Medicare Other | Admitting: Internal Medicine

## 2011-06-17 ENCOUNTER — Encounter: Payer: Self-pay | Admitting: Internal Medicine

## 2011-06-17 ENCOUNTER — Telehealth: Payer: Self-pay | Admitting: Internal Medicine

## 2011-06-17 VITALS — BP 140/90 | HR 71 | Temp 97.6°F | Ht 62.0 in | Wt 183.8 lb

## 2011-06-17 DIAGNOSIS — R059 Cough, unspecified: Secondary | ICD-10-CM

## 2011-06-17 DIAGNOSIS — R05 Cough: Secondary | ICD-10-CM

## 2011-06-17 NOTE — Telephone Encounter (Signed)
Krystal Powell  10:46 PM on 06/17/2011 doing her notes realized she is on coreg. Please call her PMD and tell him to stop it because of cough and asthma hx. He can do calcium channel blocker or ARB for bp. Needs to be done straight away because of MCT in 2 weeks

## 2011-06-17 NOTE — Progress Notes (Signed)
Subjective:    Patient ID: Krystal Powell, female    DOB: 1945-07-21, 66 y.o.   MRN: [id number]  HPI 66 yo female seen for initial pulmonary consult 05/16/11 for chronic cough and recurrent lung infection x 20 months. PCP is HALL,ZACK, MD   05/16/11 Consult for chronic cough Reports recurrent lung infection x 20 months. Reports episodes of fever, green mucus, cough, wheezing, weakness. Rx with antibiotics and prednsione that helps temporarily but recurs after a few days of completing course. Even between episodes does not feel fully fine; still has some cough. Only 1 admission for this; 05/01/11 - 05/04/11 and CXR showed LLL consolidation. Never smoked though parents smoked as child. Gives hx of GERD and gives hx of esophageal dilatation 5-6 times (Dr Kendell [facility] in [place]) and apparently refractory. Still with dysphagia - needs to take liquid to swallow food and has difficulty with large pills (cuts antibiotics into 4 pieces). She herself suspects she is aspirating food into lungs (gives hx of several episodes of coughing up food especially in last 20 months).   Overall main issue appears to be chronic daily cough. RSI cough score is 38 out of 40 and very c/w LPR cough. Describes Level 5 - post nasal drip, difficulty swallowing, breathing difficulty/choking episodies, annoying cough, sensation of something in throat, and hearburn. Level 4 - hoarseness of voice or cough after lying down  Recollects hx of PFT [facility] 2012 [place] that reportedly showed copd. 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 Lived in [place] for 15 years for asthma. REcently saw DR Willa Rough allergist who she says was nervous to do allergy tests due to pulmonary hx.. Hx of sinus surger in [place] over 20 years ago. Saw DR Suszanne Conners in [facility] last year. Reports polyps and constant sinus drainage.  REcollects vocal cord nodule ablation by Dr Deland Pretty [facility] in [place]  2013  06/04/2011 Acute OV c/o chillis, fever, congestion, cough with green mucus, runny nose, and pain in her lungs for last 4 days.  Lots of nasal drainage. Increased wheezing. OTC not helping. Feels bad with no energy.  Has CT chest planned next week. No hemoptysis, chest pain , no edema. Last ABX 1 month ago.   REC Hold fish Oil for 1 month then place in freezer  Omnicef 300 mg twice daily for 7 days, take with food.  Mucinex DM twice daily as needed for cough and congestion.  Saline nasal rinses as needed.  Fluids and rest.  Prednisone taper for the next week.  Followup as planned for CT scan next week.  follow up Dr. Marchelle Gearing in 2 weeks as planned  Please contact office for sooner follow up if symptoms do not improve or worsen or seek emergency care    OV 06/17/2011 [place] chronic cough   Cough persists unchanged and is severe and associated with mild on and off green sputum.  She did see NP earlier this month for acute bronchitis episode with wheizng and given omnicef and prednisone burst and was asked to stopp fish oil. Current RSI cough score is 37 and unchanged and c/w LPR cough.  Kouffman cough differentiator - she scored 5 for GE reflux and 4 for neurogenic cough suggesting  bouth strong ge reflux and neurogenic components for cough. CT chest 06/06/11 - esssentially clear to my review exceot for scattered nodules. She has see Dr Rhea Belton of GI and per her  hx had MBSS and esophageal manometry and both reportedly normal. FU and discussion about nissen fundoplication pending.   REviewed meds and noted she is on, flonase, zyrtec, benadryl, duoneb, dulera, singulair, protonix and znatac. Of note, she is on non specific beta blocker coreg through there is no hx of chf     CT CHEST 06/06/11 Comparison: None  Correlation: Chest radiograph 05/01/2011  Findings:  Gallbladder surgically absent.  Remaining visualized portions of upper abdomen normal appearance.  Minimal atherosclerotic  calcification.  Asymmetric density upper left breast 1.4 x 1.5 cm image 2.  Surgical clips left axilla.  No definite thoracic adenopathy.  Tracheobronchial cartilage calcification noted.  Atelectasis versus scarring in medial right upper lobe images 22-  27.  Additional atelectasis versus scarring in medial left lower lobe,  somewhat more focally dense.  Numerous tiny nodular foci noted throughout both lungs, some poorly  defined, measuring up to 5 mm diameter.  These are nonspecific in appearance of uncertain etiology; none are  definitively calcified to suggest old granulomatous disease.  No infiltrate, pleural effusion or pneumothorax.  Scattered end plate spur formation thoracic spine.  IMPRESSION:  Scattered areas of atelectasis versus scarring medial right upper  lobe and medial left lower lobe, slightly more prominent in left  lower lobe.  Numerous tiny noncalcified nodular foci throughout both lungs up to  5 mm greatest size, nonspecific.  If the patient is at high risk for bronchogenic carcinoma, follow-  up chest CT at 6-12 months is recommended. If the patient is at  low risk for bronchogenic carcinoma, follow-up chest CT at 12  months is recommended. This recommendation follows the consensus  statement: Guidelines for Management of Small Pulmonary Nodules  Detected on CT Scans: A Statement from the Fleischner Society as  published in Radiology 2005; 237:395-400.  0.  Original Report Authenticated By: Lollie Marrow, M.D.       Dr Gretta Cool Reflux Symptom Index (> 13-15 suggestive of LPR cough) 0 -> 5  =  none ->severe problem  Hoarseness of problem with voice 5  Clearing  Of Throat 5  Excess throat mucus or feeling of post nasal drip 3  Difficulty swallowing food, liquid or tablets 3  Cough after eating or lying down 5  Breathing difficulties or choking episodes 3  Troublesome or annoying cough 5  Sensation of something sticking in throat or lump in throat 4    Heartburn, chest pain, indigestion, or stomach acid coming up 4  TOTAL 37     Kouffman Reflux v Neurogenic Cough Differentiator Reflux Neurogenic Comments  Do you awaken from a sound sleep coughing violently?                            With trouble breathing? Yes    Do you have choking episodes when you cannot  Get enough air, gasping for air ?              Yes    Do you usually cough when you lie down into  The bed, or when you just lie down to rest ?                          Yes    Do you usually cough after meals or eating?         Yes    Do you cough when (or after) you bend over?  Yes    Do you more-or-less cough all day long?  Yes   Does change of temperature make you cough?     Does laughing or chuckling cause you to cough?  YEs   Do fumes (perfume, automobile fumes, burned  Toast, etc.,) cause you to cough ?       Yes   Does speaking, singing, or talking on the phone cause you to cough   ?                Yes   Total 5 4      Review of Systems  Constitutional: Negative for fever and unexpected weight change.  HENT: Negative for ear pain, nosebleeds, congestion, sore throat, rhinorrhea, sneezing, trouble swallowing, dental problem, postnasal drip and sinus pressure.   Eyes: Negative for redness and itching.  Respiratory: Positive for cough. Negative for chest tightness, shortness of breath and wheezing.   Cardiovascular: Negative for palpitations and leg swelling.  Gastrointestinal: Negative for nausea and vomiting.  Genitourinary: Negative for dysuria.  Musculoskeletal: Negative for joint swelling.  Skin: Negative for rash.  Neurological: Negative for headaches.  Hematological: Does not bruise/bleed easily.  Psychiatric/Behavioral: Negative for dysphoric mood. The patient is not nervous/anxious.        Objective:   Physical Exam GEN: A/Ox3; pleasant , NAD, well nourished   HEENT:  [place]/AT,  EACs-clear, TMs-wnl, NOSE-clear, THROAT-clear, no lesions, no postnasal  drip or exudate noted.   NECK:  Supple w/ fair ROM; no JVD; normal carotid impulses w/o bruits; no thyromegaly or nodules palpated; no lymphadenopathy.  RESP  Clear  P & A; w/o, wheezes/ rales/ or rhonchi.no accessory muscle use, no dullness to percussion  CARD:  RRR, no m/r/g  , no peripheral edema, pulses intact, no cyanosis or clubbing.  GI:   Soft & nt; nml bowel sounds; no organomegaly or masses detected.  Musco: Warm bil, no deformities or joint swelling noted.   Neuro: alert, no focal deficits noted.    Skin: Warm, no lesions or rashes          Assessment & Plan:

## 2011-06-17 NOTE — Assessment & Plan Note (Addendum)
Clearly has LPR cough with GE reflux and neurogenic cough component predominating. Though she has childhood hx of asthma I am not sure she has asthma anymore. Because if she does not, then the mdi themselves could be paradoxically making  It worse. And if she does, coreg could be making it worse. I will call her PMD and have him stop her coreg in case there is asthma. We wil start with methacholine challenge test once she is off dulera for 2 weeks.   In interim, she is to focus heavily on her GE reflux control with Dr Rhea Belton  At followup afte visit to [place] in the fall, I wil focus on cylical cough protocol as well (will decided on fu date after mct test)  > 50% of this > 25 min visit spent in face to face counseling (15 min visit converted to 25 min)

## 2011-06-17 NOTE — Telephone Encounter (Signed)
Stop dulera only prior to mct but no need to stop flonase

## 2011-06-17 NOTE — Telephone Encounter (Signed)
I spoke with pt and is aware of MR response. She voiced her understanding and had no questions 

## 2011-06-17 NOTE — Telephone Encounter (Signed)
Spoke with pt. She states forgot to mention at ov today that she is taking flonase. I added this to her med list. She is asking if she needs to stop taking this in addition to dulera prior to having MCT done. Please advise, thanks!

## 2011-06-17 NOTE — Patient Instructions (Addendum)
It appears cough is due to acid reflux, neurogenic cough and maybe asthma but we need to rule out asthma component because the inhalers themselves could make cough worse Hold dulera for 2 weeks but if you get worse call us immediately Then have methacholine challenge test Call for me to review those results Further plan based on those results  Update 10:33 PM on 06/17/2011  - will cal PMD annd have him dc coreg in case there is asthma and this non specific beta blocker is making it worse

## 2011-06-18 ENCOUNTER — Ambulatory Visit: Payer: Medicare Other | Admitting: Internal Medicine

## 2011-06-18 NOTE — Telephone Encounter (Signed)
I spoke with Bree, Dr. Scharlene Gloss nurse and advised. Carron Curie, CMA

## 2011-06-19 ENCOUNTER — Encounter: Payer: Self-pay | Admitting: Internal Medicine

## 2011-06-24 ENCOUNTER — Encounter: Payer: Self-pay | Admitting: Internal Medicine

## 2011-06-24 ENCOUNTER — Ambulatory Visit (INDEPENDENT_AMBULATORY_CARE_PROVIDER_SITE_OTHER): Payer: Medicare Other | Admitting: Internal Medicine

## 2011-06-24 VITALS — BP 140/80 | HR 80 | Temp 98.0°F | Ht 62.0 in | Wt 181.6 lb

## 2011-06-24 DIAGNOSIS — J387 Other diseases of larynx: Secondary | ICD-10-CM

## 2011-06-24 DIAGNOSIS — R1310 Dysphagia, unspecified: Secondary | ICD-10-CM

## 2011-06-24 DIAGNOSIS — J189 Pneumonia, unspecified organism: Secondary | ICD-10-CM

## 2011-06-24 DIAGNOSIS — R1314 Dysphagia, pharyngoesophageal phase: Secondary | ICD-10-CM

## 2011-06-24 DIAGNOSIS — K219 Gastro-esophageal reflux disease without esophagitis: Secondary | ICD-10-CM

## 2011-06-24 NOTE — Patient Instructions (Signed)
Continue with your current medications.  You have an appointment with Dr. Marchelle Gearing tomorrow at 4pm.

## 2011-06-25 ENCOUNTER — Ambulatory Visit (INDEPENDENT_AMBULATORY_CARE_PROVIDER_SITE_OTHER): Payer: Medicare Other | Admitting: Internal Medicine

## 2011-06-25 ENCOUNTER — Encounter: Payer: Self-pay | Admitting: Internal Medicine

## 2011-06-25 VITALS — BP 146/84 | HR 120 | Temp 97.6°F | Ht 62.0 in | Wt 184.0 lb

## 2011-06-25 DIAGNOSIS — R05 Cough: Secondary | ICD-10-CM

## 2011-06-25 DIAGNOSIS — J441 Chronic obstructive pulmonary disease with (acute) exacerbation: Secondary | ICD-10-CM

## 2011-06-25 MED ORDER — PREDNISONE 10 MG PO TABS: ORAL_TABLET | ORAL | Status: DC

## 2011-06-25 NOTE — Progress Notes (Signed)
Subjective:    Patient ID: Krystal Powell, female    DOB: 1945-05-02, 66 y.o.   MRN: [id number]  HPI 66 yo female seen for initial pulmonary consult 05/16/11 for chronic cough and recurrent lung infection x 20 months. PCP is HALL,ZACK, MD   05/16/11 Consult for chronic cough Reports recurrent lung infection x 20 months. Reports episodes of fever, green mucus, cough, wheezing, weakness. Rx with antibiotics and prednsione that helps temporarily but recurs after a few days of completing course. Even between episodes does not feel fully fine; still has some cough. Only 1 admission for this; 05/01/11 - 05/04/11 and CXR showed LLL consolidation. Never smoked though parents smoked as child. Gives hx of GERD and gives hx of esophageal dilatation 5-6 times (Dr [facility]ell [facility] in [place]) and apparently refractory. Still with dysphagia - needs to take liquid to swallow food and has difficulty with large pills (cuts antibiotics into 4 pieces). She herself suspects she is aspirating food into lungs (gives hx of several episodes of coughing up food especially in last 20 months).   Overall main issue appears to be chronic daily cough. RSI cough score is 38 out of 40 and very c/w LPR cough. Describes Level 5 - post nasal drip, difficulty swallowing, breathing difficulty/choking episodies, annoying cough, sensation of something in throat, and hearburn. Level 4 - hoarseness of voice or cough after lying down  Recollects hx of PFT [facility] 2012 [place] that reportedly showed copd. 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 Lived in [place] for 15 years for asthma. REcently saw DR Willa Rough allergist who she says was nervous to do allergy tests due to pulmonary hx.. Hx of sinus surger in [place] over 20 years ago. Saw DR Suszanne Conners in [place] [facility] last year. Reports polyps and constant sinus drainage.  REcollects vocal cord nodule ablation by Dr Deland [facility] [place]  2013  06/04/2011 Acute OV c/o chillis, fever, congestion, cough with green mucus, runny nose, and pain in her lungs for last 4 days.  Lots of nasal drainage. Increased wheezing. OTC not helping. Feels bad with no energy.  Has CT chest planned next week. No hemoptysis, chest pain , no edema. Last ABX 1 month ago.   REC Hold fish Oil for 1 month then place in freezer  Omnicef 300 mg twice daily for 7 days, take with food.  Mucinex DM twice daily as needed for cough and congestion.  Saline nasal rinses as needed.  Fluids and rest.  Prednisone taper for the next week.  Followup as planned for CT scan next week.  follow up Dr. Marchelle Gearing in 2 weeks as planned  Please contact office for sooner follow up if symptoms do not improve or worsen or seek emergency care    OV 06/17/2011 [place] chronic cough   Cough persists unchanged and is severe and associated with mild on and off green sputum.  She did see NP earlier this month for acute bronchitis episode with wheizng and given omnicef and prednisone burst and was asked to stopp fish oil. Current RSI cough score is 37 and unchanged and c/w LPR cough.  Kouffman cough differentiator - she scored 5 for GE reflux and 4 for neurogenic cough suggesting  bouth strong ge reflux and neurogenic components for cough. CT chest 06/06/11 - esssentially clear to my review exceot for scattered nodules. She has see Dr Rhea Belton of GI and per her  hx had MBSS and esophageal manometry and both reportedly normal. FU and discussion about nissen fundoplication pending.   REviewed meds and noted she is on, flonase, zyrtec, benadryl, duoneb, dulera, singulair, protonix and znatac. Of note, she is on non specific beta blocker coreg through there is no hx of chf     CT CHEST 06/06/11 Comparison: None  Correlation: Chest radiograph 05/01/2011  Findings:  Gallbladder surgically absent.  Remaining visualized portions of upper abdomen normal appearance.  Minimal atherosclerotic  calcification.  Asymmetric density upper left breast 1.4 x 1.5 cm image 2.  Surgical clips left axilla.  No definite thoracic adenopathy.  Tracheobronchial cartilage calcification noted.  Atelectasis versus scarring in medial right upper lobe images 22-  27.  Additional atelectasis versus scarring in medial left lower lobe,  somewhat more focally dense.  Numerous tiny nodular foci noted throughout both lungs, some poorly  defined, measuring up to 5 mm diameter.  These are nonspecific in appearance of uncertain etiology; none are  definitively calcified to suggest old granulomatous disease.  No infiltrate, pleural effusion or pneumothorax.  Scattered end plate spur formation thoracic spine.  IMPRESSION:  Scattered areas of atelectasis versus scarring medial right upper  lobe and medial left lower lobe, slightly more prominent in left  lower lobe.  Numerous tiny noncalcified nodular foci throughout both lungs up to  5 mm greatest size, nonspecific.  If the patient is at high risk for bronchogenic carcinoma, follow-  up chest CT at 6-12 months is recommended. If the patient is at  low risk for bronchogenic carcinoma, follow-up chest CT at 12  months is recommended. This recommendation follows the consensus  statement: Guidelines for Management of Small Pulmonary Nodules  Detected on CT Scans: A Statement from the Fleischner Society as  published in Radiology 2005; 237:395-400.  0.  Original Report Authenticated By: Lollie Marrow, M.D.       Dr Gretta Cool Reflux Symptom Index (> 13-15 suggestive of LPR cough) 0 -> 5  =  none ->severe problem  Hoarseness of problem with voice 5  Clearing  Of Throat 5  Excess throat mucus or feeling of post nasal drip 3  Difficulty swallowing food, liquid or tablets 3  Cough after eating or lying down 5  Breathing difficulties or choking episodes 3  Troublesome or annoying cough 5  Sensation of something sticking in throat or lump in throat 4    Heartburn, chest pain, indigestion, or stomach acid coming up 4  TOTAL 37     Kouffman Reflux v Neurogenic Cough Differentiator Reflux Neurogenic Comments  Do you awaken from a sound sleep coughing violently?                            With trouble breathing? Yes    Do you have choking episodes when you cannot  Get enough air, gasping for air ?              Yes    Do you usually cough when you lie down into  The bed, or when you just lie down to rest ?                          Yes    Do you usually cough after meals or eating?         Yes    Do you cough when (or after) you bend over?  Yes    Do you more-or-less cough all day long?  Yes   Does change of temperature make you cough?     Does laughing or chuckling cause you to cough?  YEs   Do fumes (perfume, automobile fumes, burned  Toast, etc.,) cause you to cough ?       Yes   Does speaking, singing, or talking on the phone cause you to cough   ?                Yes   Total 5 4     REC It appears cough is due to acid reflux, neurogenic cough and maybe asthma but we need to rule out asthma component because the inhalers themselves could make cough worse  Hold dulera for 2 weeks but if you get worse call us immediately  Then have methacholine challenge test  Call for me to review those results  Further plan based on those results  Update 10:33 PM on 06/17/2011  - will cal PMD annd have him dc coreg in case there is asthma and this non specific beta blocker is making it worse  OV 06/25/2011 - ACUTE VISIT Cough worse after stopping dulera while waiting for methacholine challenge. Worsening started 4 days after coming off dulera. Reports worsening cough that is dry, dyspnea, wheeze but no sputum. She saw GI yesterday and they are recommending current line of GERD mgmt with option of nissen fundoplication in future. Today RSI cough score is 34 (note she reports cough is worse but score is better/similar to last ov) and on cough  differentiator she scored 4 out of 5 for GERD and 5 out of 5 for neurogenic cough. Spirometry today sows moderate obstuction  -fev1 1.38L/64%, Ratio 69. FVC 1.99L/73%, small airway 43%  Of note, she is now off coreg and off fish oil  Past, Family, Social reviewed: no change since last visit other than GI visit note that is reviewed in HPI   MEDS REviewed below  Current outpatient prescriptions:acetaminophen (TYLENOL) 500 MG tablet, Take 1,000 mg by mouth 2 (two) times daily.  , Disp: , Rfl: ;  albuterol (PROVENTIL HFA;VENTOLIN HFA) 108 (90 BASE) MCG/ACT inhaler, Inhale 2 puffs into the lungs every 6 (six) hours as needed. For shortness of breath , Disp: , Rfl: ;  ALPRAZolam (XANAX) 0.5 MG tablet, Take 0.5 mg by mouth at bedtime. , Disp: , Rfl:  amLODipine (NORVASC) 5 MG tablet, Take 5 mg by mouth daily., Disp: , Rfl: ;  beclomethasone (BECONASE-AQ) 42 MCG/SPRAY nasal spray, Place 2 sprays into the nose 2 (two) times daily. , Disp: , Rfl: ;  CELEBREX 200 MG capsule, Take 200 mg by mouth 2 (two) times daily. , Disp: , Rfl: ;  cetirizine (ZYRTEC) 10 MG tablet, Take 10 mg by mouth daily., Disp: , Rfl:  Cholecalciferol (VITAMIN D) 2000 UNITS tablet, Take 2,000 Units by mouth every morning.  , Disp: , Rfl: ;  Cyanocobalamin 2500 MCG TABS, Take 1 tablet by mouth every morning.  , Disp: , Rfl: ;  diphenhydrAMINE (BENADRYL) 25 MG tablet, Take 25 mg by mouth at bedtime., Disp: , Rfl: ;  fenofibrate 160 MG tablet, Take 160 mg by mouth daily after breakfast. , Disp: , Rfl:  fish oil-omega-3 fatty acids 1000 MG capsule, Take 1 g by mouth every morning. , Disp: , Rfl: ;  fluticasone (FLONASE) 50 MCG/ACT nasal spray, Place 2 sprays into the nose daily., Disp: , Rfl: ;  HYDROcodone-acetaminophen (NORCO)  7.5-325 MG per tablet, Take 1 tablet by mouth every 6 (six) hours as needed. For pain, Disp: , Rfl:  ipratropium-albuterol (DUONEB) 0.5-2.5 (3) MG/3ML SOLN, Take 3 mLs by nebulization 4 (four) times daily as needed. For  shortness of breath, Disp: , Rfl: ;  levothyroxine (SYNTHROID, LEVOTHROID) 100 MCG tablet, Take 100 mcg by mouth every morning. , Disp: , Rfl: ;  meclizine (ANTIVERT) 25 MG tablet, Take 25 mg by mouth 3 (three) times daily as needed. For dizziness, Disp: , Rfl:  olmesartan-hydrochlorothiazide (BENICAR HCT) 40-12.5 MG per tablet, Take 0.5 tablets by mouth daily. , Disp: , Rfl: ;  pantoprazole (PROTONIX) 40 MG tablet, Take 1 tablet (40 mg total) by mouth 2 (two) times daily., Disp: 30 tablet, Rfl: 6;  Phenylephrine-DM-GG (MUCINEX CHILD COLD PO), Take by mouth as needed., Disp: , Rfl: ;  potassium chloride (K-DUR,KLOR-CON) 10 MEQ tablet, Take 10 mEq by mouth daily. , Disp: , Rfl:  PRESCRIPTION MEDICATION, Take 5 mLs by mouth every 6 (six) hours as needed. [place] [facility] compound  Of hydrocodone 5 mg/ 5 mL, phenylephrine 10 mg/  5 mL, chlorphenamine 4mg  / 5 mL with honey , Disp: , Rfl: ;  promethazine (PHENERGAN) 25 MG tablet, Take 25 mg by mouth every 6 (six) hours as needed. For nausea, Disp: , Rfl: ;  ranitidine (ZANTAC) 150 MG tablet, Take 150 mg by mouth at bedtime., Disp: , Rfl:  Red Yeast Rice 600 MG CAPS, Take 1 capsule by mouth every morning.  , Disp: , Rfl: ;  SINGULAIR 10 MG tablet, Take 10 mg by mouth at bedtime. , Disp: , Rfl: ;  vitamin E 1000 UNIT capsule, Take 1,000 Units by mouth every morning.  , Disp: , Rfl: ;  mometasone-formoterol (DULERA) 100-5 MCG/ACT AERO, Inhale 2 puffs into the lungs 2 (two) times daily., Disp: , Rfl:    Review of Systems  Constitutional: Negative for fever and unexpected weight change.  HENT: Negative for ear pain, nosebleeds, congestion, sore throat, rhinorrhea, sneezing, trouble swallowing, dental problem, postnasal drip and sinus pressure.   Eyes: Negative for redness and itching.  Respiratory: Positive for cough, chest tightness, shortness of breath and wheezing.   Cardiovascular: Negative for palpitations and leg swelling.  Gastrointestinal: Negative for  nausea and vomiting.  Genitourinary: Negative for dysuria.  Musculoskeletal: Negative for joint swelling.  Skin: Negative for rash.  Neurological: Negative for headaches.  Hematological: Does not bruise/bleed easily.  Psychiatric/Behavioral: Negative for dysphoric mood. The patient is not nervous/anxious.        Objective:   Physical Exam GEN: A/Ox3; pleasant , NAD, well nourished   HEENT:  [place]/AT,  EACs-clear, TMs-wnl, NOSE-clear, THROAT-clear, no lesions, no postnasal drip or exudate noted.   NECK:  Supple w/ fair ROM; no JVD; normal carotid impulses w/o bruits; no thyromegaly or nodules palpated; no lymphadenopathy.  RESP  Clear  P & A; w/o, wheezes/ rales/ or rhonchi.no accessory muscle use, no dullness to percussion  CARD:  RRR, no m/r/g  , no peripheral edema, pulses intact, no cyanosis or clubbing.  GI:   Soft & nt; nml bowel sounds; no organomegaly or masses detected.  Musco: Warm bil, no deformities or joint swelling noted.   Neuro: alert, no focal deficits noted.    Skin: Warm, no lesions or rashes         Assessment & Plan:

## 2011-06-25 NOTE — Patient Instructions (Addendum)
No need to have methacholine challenge test - wwe will cancel it Breathing test shows obstruction So, go back on dulera - take samples Take prednisone 40 mg daily x 2 days, then 20mg  daily x 2 days, then 10mg  daily x 2 days, then 5mg  daily x 2 days and stop I will see you back in 3-4 weeks  Cough score and spirometry at followup

## 2011-06-25 NOTE — Progress Notes (Signed)
Subjective:    Patient ID: Krystal Powell, female    DOB: [birth year], 66 y.o.   MRN: [id number]  HPI Mrs. Clippinger is a 66 year old female with a past medical history of GERD, just is ring status post dilation in November 2012, recurrent pneumonia and chronic cough felt secondary to aspiration and esophageal reflux, hypertension, hyperlipidemia, hypothyroidism, who seen in followup for reflux disease and concern of aspiration. Since last being seen in clinic, she has remained on pantoprazole 40 mg twice daily each bedtime ranitidine. He also was seen by speech pathology for swallowing evaluation. They felt that her dry mouth was contributing to her swallowing from an oropharyngeal standpoint, but there was no frank aspiration. She also underwent esophageal manometry.  Today she reports her heartburn and reflux symptoms for the most part are controlled, but she will occasionally have breakthrough reflux symptoms at night. When this occurs she usually has to sit up her proper self up to improve her symptoms. She does try to modify her diet, but notes acidic or spicy foods tends to make her reflux symptoms worse. Today she continues to have cough, which is worse over the past week. She was recently seen by pulmonary for this issue. She reports her cough can be productive of thick yellowish mucus.  She has not had fevers or chills.  For the most part she has not noted significant dysphagia. No odynophagia. She reports most foods and medications go down without trouble. Occasionally she does feel like solid foods will stick but that is normally transient and short-lived.  Review of Systems As per history of present illness, otherwise negative  Current Medications, Allergies, Past Medical History, Past Surgical History, Family History and Social History were reviewed in [employer] record.     Objective:   Physical Exam BP 140/80  Pulse 80  Temp 98 F (36.7 C)  Ht 5\' 2"  (1.575  m)  Wt 181 lb 9.6 oz (82.373 kg)  BMI 33.21 kg/m2 Constitutional: Well-developed and well-nourished. No distress.  HEENT: Normocephalic and atraumatic. Oropharynx is clear and moist. No oropharyngeal exudate. Conjunctivae are normal. Pupils are equal round and reactive to light. No scleral icterus.  Neck: Neck supple. Trachea midline.  Cardiovascular: Normal rate, regular rhythm and intact distal pulses. No M/R/G  Pulmonary/chest: Effort normal and breath sounds normal. Mild end expiratory wheeze, no rales or rhonchi Abdominal: Soft, nontender, nondistended. Bowel sounds active throughout. There are no masses palpable. No hepatosplenomegaly.  Extremities: no clubbing, cyanosis, with trace pretibial  Lymphadenopathy: No cervical adenopathy noted.  Neurological: Alert and oriented to person place and time.  Skin: Skin is warm and dry. No rashes noted.  Psychiatric: Normal mood and affect. Behavior is normal.  Esophageal Manometry -- mildly hypertensive lower esophageal sphincter pressure, at 49.9 mmHg (normal 10-45 mmHg), normal relaxation of the lower esophageal sphincter. The amplitude and duration was slightly increased in the lower esophageal body portion of the study, raising the question of diffuse esophageal spasm. The upper esophageal sphincter is normal. All 12 measured swallows revealed normal peristalsis.    Assessment & Plan:   66 year old female with a past medical history of GERD, just is ring status post dilation in November 2012, recurrent pneumonia and chronic cough felt secondary to aspiration and esophageal reflux, hypertension, hyperlipidemia, hypothyroidism, who seen in followup for reflux disease and concern of aspiration.   1. GERD/dysphagia/hx of Schatzki's ring -- the biggest issue for Mrs. Sterne continues to be recurrent and unresolving pulmonary infection/bronchitis. Her  dysphagia, for the most part does not seem to be a big problem for her. When it occurs it is  intermittent. She did not respond to esophageal dilation in Nov 2012 (18 mm [place]), making the Schatzki's ring the unlikely culprit to her intermittent symptoms.  She very well may have some diffuse esophageal spasm and this may contribute to her symptoms. She has no manometric evidence of achalasia. Her lower esophageal sphincter pressure was slightly elevated, but I'm hesitant to recommend Botox injection as this may make her acid reflux worse. She has recently been started on calcium channel blocker, and this may in fact help some with her esophageal spasm.   At present, she remains on maximal PPI therapy with each bedtime H2 blocker. I continue to recommend that she avoid eating and drinking within 2 hours of lying down. She should prop her bed up to help avoid nocturnal reflux. Medically speaking, from her reflux perspective I do not think there are additional options at this time. Surgery continues to be a possible option for Nissen fundoplication to prevent reflux. However, prior to this, we have discussed referral for impedance testing to document her reflux events. This would need to be done at a [facility], likely [facility].  For now she will continue twice a day PPI, each bedtime ranitidine.

## 2011-06-25 NOTE — Assessment & Plan Note (Signed)
No need to have methacholine challenge test - wwe will cancel it Breathing test shows obstruction So, go back on dulera - take samples Take prednisone 40 mg daily x 2 days, then 20mg daily x 2 days, then 10mg daily x 2 days, then 5mg daily x 2 days and stop I will see you back in 3-4 weeks  Cough score and spirometry at followup  

## 2011-07-01 ENCOUNTER — Encounter (HOSPITAL_COMMUNITY): Payer: Medicare Other

## 2011-07-01 ENCOUNTER — Ambulatory Visit: Payer: Medicare Other | Admitting: Orthopedic Surgery

## 2011-07-11 ENCOUNTER — Ambulatory Visit: Payer: Medicare Other | Admitting: Internal Medicine

## 2011-09-01 ENCOUNTER — Other Ambulatory Visit (HOSPITAL_COMMUNITY): Payer: Self-pay | Admitting: Internal Medicine

## 2011-09-03 ENCOUNTER — Encounter (HOSPITAL_COMMUNITY): Payer: Medicare Other

## 2011-09-17 ENCOUNTER — Encounter (HOSPITAL_COMMUNITY): Payer: Medicare Other

## 2011-09-24 ENCOUNTER — Ambulatory Visit (HOSPITAL_COMMUNITY)
Admission: RE | Admit: 2011-09-24 | Discharge: 2011-09-24 | Disposition: A | Discharge status: Home / Self Care | Payer: Medicare Other | Source: Ambulatory Visit | Attending: Internal Medicine | Admitting: Internal Medicine

## 2011-09-24 ENCOUNTER — Other Ambulatory Visit (HOSPITAL_COMMUNITY): Payer: Self-pay | Admitting: Internal Medicine

## 2011-09-24 DIAGNOSIS — N63 Unspecified lump in unspecified breast: Secondary | ICD-10-CM | POA: Insufficient documentation

## 2011-09-24 DIAGNOSIS — IMO0002 Reserved for concepts with insufficient information to code with codable children: Secondary | ICD-10-CM

## 2011-10-24 ENCOUNTER — Telehealth: Payer: Self-pay | Admitting: Gastroenterology

## 2011-10-24 NOTE — Telephone Encounter (Signed)
Called pt to see how she was doing, she said her swallowing is fine, she still wakes up between 3am-4am everyday due to her reflux.  She states she is taking 2 protonix daily and 1 zantac at night.  She would like something stronger, but says she gets in the "donut hole" and cant afford the expensive medications.

## 2011-10-24 NOTE — Telephone Encounter (Signed)
It sounds as if she is taking 80 Protonix in the morning and Zantac at night, I recommend taking pantoprazole 40 mg 30 minutes to one hour before breakfast, and then an additional 40 mg 30 minutes to one hour before her last meal of the day. She can continue to use 150-300 mg of Zantac each bedtime in addition to the twice a day Protonix Hopefully splitting the Protonix dose into morning and evening dosing will help We can offer Dexilant 60 mg daily, but this will likely be cost prohibitive Please have her keep [place] informed as to how her symptoms are doing

## 2011-10-27 ENCOUNTER — Telehealth: Payer: Self-pay | Admitting: Gastroenterology

## 2011-10-27 NOTE — Telephone Encounter (Signed)
Spoke to pt. Asked her how she is taking her protonix, and it is as Dr. Rhea Belton told her to. I offered her Dexilant and she has agreed to try the samples. I told her I will leave her a few boxes at our front desk.

## 2011-11-21 ENCOUNTER — Other Ambulatory Visit (HOSPITAL_COMMUNITY): Payer: Self-pay | Admitting: Internal Medicine

## 2011-11-21 DIAGNOSIS — M539 Dorsopathy, unspecified: Secondary | ICD-10-CM

## 2011-11-25 ENCOUNTER — Ambulatory Visit (HOSPITAL_COMMUNITY)
Admission: RE | Admit: 2011-11-25 | Discharge: 2011-11-25 | Disposition: A | Discharge status: Home / Self Care | Payer: Medicare Other | Source: Ambulatory Visit | Attending: Internal Medicine | Admitting: Internal Medicine

## 2011-11-25 DIAGNOSIS — M539 Dorsopathy, unspecified: Secondary | ICD-10-CM

## 2011-11-25 DIAGNOSIS — M5126 Other intervertebral disc displacement, lumbar region: Secondary | ICD-10-CM | POA: Insufficient documentation

## 2011-11-25 DIAGNOSIS — M545 Low back pain, unspecified: Secondary | ICD-10-CM | POA: Insufficient documentation

## 2011-11-25 DIAGNOSIS — M79609 Pain in unspecified limb: Secondary | ICD-10-CM | POA: Insufficient documentation

## 2011-12-29 ENCOUNTER — Encounter: Payer: Self-pay | Admitting: Adult Health

## 2011-12-29 ENCOUNTER — Ambulatory Visit (INDEPENDENT_AMBULATORY_CARE_PROVIDER_SITE_OTHER): Payer: Medicare Other | Admitting: Adult Health

## 2011-12-29 VITALS — BP 136/84 | HR 77 | Temp 98.1°F | Ht 62.0 in | Wt 176.0 lb

## 2011-12-29 DIAGNOSIS — J45909 Unspecified asthma, uncomplicated: Secondary | ICD-10-CM

## 2011-12-29 MED ORDER — PREDNISONE 10 MG PO TABS: ORAL_TABLET | ORAL | Status: DC

## 2011-12-29 MED ORDER — CEFDINIR 300 MG PO CAPS: 300.0000 mg | ORAL_CAPSULE | Freq: Two times a day (BID) | ORAL | Status: DC

## 2011-12-29 NOTE — Assessment & Plan Note (Signed)
[place]   Plan  Omnicef 300 mg twice daily for 10 days, take with food. Mucinex DM twice daily as needed for cough and congestion. Saline nasal rinses as needed. Fluids and rest. Prednisone taper for the next week. Follow up Dr. Marchelle Gearing 6 weeks and As needed   Please contact office for sooner follow up if symptoms do not improve or worsen or seek emergency care

## 2011-12-29 NOTE — Patient Instructions (Addendum)
Omnicef 300 mg twice daily for 10 days, take with food. Mucinex DM twice daily as needed for cough and congestion. Saline nasal rinses as needed. Fluids and rest. Prednisone taper for the next week. Follow up Dr. Marchelle Gearing 6 weeks and As needed   Please contact office for sooner follow up if symptoms do not improve or worsen or seek emergency care

## 2011-12-29 NOTE — Progress Notes (Signed)
Subjective:    Patient ID: Krystal Powell, female    DOB: [birth year], 66 y.o.   MRN: [id number]  HPI 66 yo female seen for initial pulmonary consult 05/16/11 for chronic cough and recurrent lung infection x 20 months. PCP is HALL,ZACK, MD   05/16/11 Consult for chronic cough Reports recurrent lung infection x 20 months. Reports episodes of fever, green mucus, cough, wheezing, weakness. Rx with antibiotics and prednsione that helps temporarily but recurs after a few days of completing course. Even between episodes does not feel fully fine; still has some cough. Only 1 admission for this; 05/01/11 - 05/04/11 and CXR showed LLL consolidation. Never smoked though parents smoked as child. Gives hx of GERD and gives hx of esophageal dilatation 5-6 times (Dr Kendell [facility] in [place]) and apparently refractory. Still with dysphagia - needs to take liquid to swallow food and has difficulty with large pills (cuts antibiotics into 4 pieces). She herself suspects she is aspirating food into lungs (gives hx of several episodes of coughing up food especially in last 20 months).   Overall main issue appears to be chronic daily cough. RSI cough score is 38 out of 40 and very c/w LPR cough. Describes Level 5 - post nasal drip, difficulty swallowing, breathing difficulty/choking episodies, annoying cough, sensation of something in throat, and hearburn. Level 4 - hoarseness of voice or cough after lying down  Recollects hx of PFT M[facility] 2012 [place] that reportedly showed copd. 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 Lived in [place] for 15 years for asthma. REcently saw DR Willa Rough allergist who she says was nervous to do allergy tests due to pulmonary hx.. Hx of sinus surger in [place] over 20 years ago. Saw DR Suszanne Conners in [place] [facility] last year. Reports polyps and constant sinus drainage.  REcollects vocal cord nodule ablation by Dr Deland Pretty [facility] [place]  2013  06/04/2011 Acute OV c/o chillis, fever, congestion, cough with green mucus, runny nose, and pain in her lungs for last 4 days.  Lots of nasal drainage. Increased wheezing. OTC not helping. Feels bad with no energy.  Has CT chest planned next week. No hemoptysis, chest pain , no edema. Last ABX 1 month ago.   REC Hold fish Oil for 1 month then place in freezer  Omnicef 300 mg twice daily for 7 days, take with food.  Mucinex DM twice daily as needed for cough and congestion.  Saline nasal rinses as needed.  Fluids and rest.  Prednisone taper for the next week.  Followup as planned for CT scan next week.  follow up Dr. Marchelle Gearing in 2 weeks as planned  Please contact office for sooner follow up if symptoms do not improve or worsen or seek emergency care    OV 06/17/2011 [place] chronic cough   Cough persists unchanged and is severe and associated with mild on and off green sputum.  She did see NP earlier this month for acute bronchitis episode with wheizng and given omnicef and prednisone burst and was asked to stopp fish oil. Current RSI cough score is 37 and unchanged and c/w LPR cough.  Kouffman cough differentiator - she scored 5 for GE reflux and 4 for neurogenic cough suggesting  bouth strong ge reflux and neurogenic components for cough. CT chest 06/06/11 - esssentially clear to my review exceot for scattered nodules. She has see Dr Rhea Belton of GI and per her  hx had MBSS and esophageal manometry and both reportedly normal. FU and discussion about nissen fundoplication pending.   REviewed meds and noted she is on, flonase, zyrtec, benadryl, duoneb, dulera, singulair, protonix and znatac. Of note, she is on non specific beta blocker coreg through there is no hx of chf     CT CHEST 06/06/11 Comparison: None  Correlation: Chest radiograph 05/01/2011  Findings:  Gallbladder surgically absent.  Remaining visualized portions of upper abdomen normal appearance.  Minimal atherosclerotic  calcification.  Asymmetric density upper left breast 1.4 x 1.5 cm image 2.  Surgical clips left axilla.  No definite thoracic adenopathy.  Tracheobronchial cartilage calcification noted.  Atelectasis versus scarring in medial right upper lobe images 22-  27.  Additional atelectasis versus scarring in medial left lower lobe,  somewhat more focally dense.  Numerous tiny nodular foci noted throughout both lungs, some poorly  defined, measuring up to 5 mm diameter.  These are nonspecific in appearance of uncertain etiology; none are  definitively calcified to suggest old granulomatous disease.  No infiltrate, pleural effusion or pneumothorax.  Scattered end plate spur formation thoracic spine.  IMPRESSION:  Scattered areas of atelectasis versus scarring medial right upper  lobe and medial left lower lobe, slightly more prominent in left  lower lobe.  Numerous tiny noncalcified nodular foci throughout both lungs up to  5 mm greatest size, nonspecific.  If the patient is at high risk for bronchogenic carcinoma, follow-  up chest CT at 6-12 months is recommended. If the patient is at  low risk for bronchogenic carcinoma, follow-up chest CT at 12  months is recommended. This recommendation follows the consensus  statement: Guidelines for Management of Small Pulmonary Nodules  Detected on CT Scans: A Statement from the Fleischner Society as  published in Radiology 2005; 237:395-400.  0.  Original Report Authenticated By: Lollie Marrow, M.D.       Dr Gretta Cool Reflux Symptom Index (> 13-15 suggestive of LPR cough) 0 -> 5  =  none ->severe problem  Hoarseness of problem with voice 5  Clearing  Of Throat 5  Excess throat mucus or feeling of post nasal drip 3  Difficulty swallowing food, liquid or tablets 3  Cough after eating or lying down 5  Breathing difficulties or choking episodes 3  Troublesome or annoying cough 5  Sensation of something sticking in throat or lump in throat 4    Heartburn, chest pain, indigestion, or stomach acid coming up 4  TOTAL 37     Kouffman Reflux v Neurogenic Cough Differentiator Reflux Neurogenic Comments  Do you awaken from a sound sleep coughing violently?                            With trouble breathing? Yes    Do you have choking episodes when you cannot  Get enough air, gasping for air ?              Yes    Do you usually cough when you lie down into  The bed, or when you just lie down to rest ?                          Yes    Do you usually cough after meals or eating?         Yes    Do you cough when (or after) you bend over?  Yes    Do you more-or-less cough all day long?  Yes   Does change of temperature make you cough?     Does laughing or chuckling cause you to cough?  YEs   Do fumes (perfume, automobile fumes, burned  Toast, etc.,) cause you to cough ?       Yes   Does speaking, singing, or talking on the phone cause you to cough   ?                Yes   Total 5 4     REC It appears cough is due to acid reflux, neurogenic cough and maybe asthma but we need to rule out asthma component because the inhalers themselves could make cough worse  Hold dulera for 2 weeks but if you get worse call us immediately  Then have methacholine challenge test  Call for me to review those results  Further plan based on those results  Update 10:33 PM on 06/17/2011  - will cal PMD annd have him dc coreg in case there is asthma and this non specific beta blocker is making it worse  OV 06/25/2011 - ACUTE VISIT Cough worse after stopping dulera while waiting for methacholine challenge. Worsening started 4 days after coming off dulera. Reports worsening cough that is dry, dyspnea, wheeze but no sputum. She saw GI yesterday and they are recommending current line of GERD mgmt with option of nissen fundoplication in future. Today RSI cough score is 34 (note she reports cough is worse but score is better/similar to last ov) and on cough  differentiator she scored 4 out of 5 for GERD and 5 out of 5 for neurogenic cough. Spirometry today sows moderate obstuction  -fev1 1.38L/64%, Ratio 69. FVC 1.99L/73%, small airway 43%  Of note, she is now off coreg and off fish oil >>restart Dulera   12/29/2011 Acute OV  Complains of prod cough with green mucus, hoarseness, rattling in chest, increased SOB, chills/sweats x6 days.  Finished a pred pak yesterday that was left over.  Did not help. Still coughing up blood.  No chest pain, edema or fever.  otc cough meds not working.    Review of Systems  Constitutional: Negative for fever and unexpected weight change.  HENT: Negative for ear pain, nosebleeds, congestion, sore throat, rhinorrhea, sneezing, trouble swallowing, dental problem, postnasal drip and sinus pressure.   Eyes: Negative for redness and itching.  Respiratory: Positive for cough, chest tightness, shortness of breath and wheezing.   Cardiovascular: Negative for palpitations and leg swelling.  Gastrointestinal: Negative for nausea and vomiting.  Genitourinary: Negative for dysuria.  Musculoskeletal: Negative for joint swelling.  Skin: Negative for rash.  Neurological: Negative for headaches.  Hematological: Does not bruise/bleed easily.  Psychiatric/Behavioral: Negative for dysphoric mood. The patient is not nervous/anxious.        Objective:   Physical Exam GEN: A/Ox3; pleasant , NAD, well nourished in power chair   HEENT:  [place]/AT,  EACs-clear, TMs-wnl, NOSE-clear drainage , max tenderness  THROAT-clear, no lesions, no postnasal drip or exudate noted.   NECK:  Supple w/ fair ROM; no JVD; normal carotid impulses w/o bruits; no thyromegaly or nodules palpated; no lymphadenopathy.  RESP  Scattered rhonchi .no accessory muscle use, no dullness to percussion  CARD:  RRR, no m/r/g  , no peripheral edema, pulses intact, no cyanosis or clubbing.  GI:   Soft & nt; nml bowel sounds; no organomegaly or masses  detected.  Musco: Warm  bil, no deformities or joint swelling noted.   Neuro: alert, no focal deficits noted.    Skin: Warm, no lesions or rashes         Assessment & Plan:

## 2012-01-09 ENCOUNTER — Emergency Department (HOSPITAL_COMMUNITY)
Admission: EM | Admit: 2012-01-09 | Discharge: 2012-01-09 | Disposition: A | Discharge status: Home / Self Care | Payer: Medicare Other | Attending: Emergency Medicine | Admitting: Emergency Medicine

## 2012-01-09 ENCOUNTER — Emergency Department (HOSPITAL_COMMUNITY): Payer: Medicare Other

## 2012-01-09 ENCOUNTER — Encounter (HOSPITAL_COMMUNITY): Payer: Self-pay | Admitting: *Deleted

## 2012-01-09 DIAGNOSIS — E7800 Pure hypercholesterolemia, unspecified: Secondary | ICD-10-CM | POA: Insufficient documentation

## 2012-01-09 DIAGNOSIS — M5136 Other intervertebral disc degeneration, lumbar region: Secondary | ICD-10-CM

## 2012-01-09 DIAGNOSIS — Z79899 Other long term (current) drug therapy: Secondary | ICD-10-CM | POA: Insufficient documentation

## 2012-01-09 DIAGNOSIS — F411 Generalized anxiety disorder: Secondary | ICD-10-CM | POA: Insufficient documentation

## 2012-01-09 DIAGNOSIS — K589 Irritable bowel syndrome without diarrhea: Secondary | ICD-10-CM | POA: Insufficient documentation

## 2012-01-09 DIAGNOSIS — M129 Arthropathy, unspecified: Secondary | ICD-10-CM | POA: Insufficient documentation

## 2012-01-09 DIAGNOSIS — E119 Type 2 diabetes mellitus without complications: Secondary | ICD-10-CM | POA: Insufficient documentation

## 2012-01-09 DIAGNOSIS — E039 Hypothyroidism, unspecified: Secondary | ICD-10-CM | POA: Insufficient documentation

## 2012-01-09 DIAGNOSIS — I10 Essential (primary) hypertension: Secondary | ICD-10-CM | POA: Insufficient documentation

## 2012-01-09 DIAGNOSIS — M51379 Other intervertebral disc degeneration, lumbosacral region without mention of lumbar back pain or lower extremity pain: Secondary | ICD-10-CM | POA: Insufficient documentation

## 2012-01-09 DIAGNOSIS — J4489 Other specified chronic obstructive pulmonary disease: Secondary | ICD-10-CM | POA: Insufficient documentation

## 2012-01-09 DIAGNOSIS — Z8701 Personal history of pneumonia (recurrent): Secondary | ICD-10-CM | POA: Insufficient documentation

## 2012-01-09 DIAGNOSIS — J45901 Unspecified asthma with (acute) exacerbation: Secondary | ICD-10-CM | POA: Insufficient documentation

## 2012-01-09 DIAGNOSIS — M5137 Other intervertebral disc degeneration, lumbosacral region: Secondary | ICD-10-CM | POA: Insufficient documentation

## 2012-01-09 DIAGNOSIS — G8929 Other chronic pain: Secondary | ICD-10-CM | POA: Insufficient documentation

## 2012-01-09 DIAGNOSIS — IMO0001 Reserved for inherently not codable concepts without codable children: Secondary | ICD-10-CM | POA: Insufficient documentation

## 2012-01-09 DIAGNOSIS — J449 Chronic obstructive pulmonary disease, unspecified: Secondary | ICD-10-CM | POA: Insufficient documentation

## 2012-01-09 DIAGNOSIS — K219 Gastro-esophageal reflux disease without esophagitis: Secondary | ICD-10-CM | POA: Insufficient documentation

## 2012-01-09 MED ORDER — ONDANSETRON HCL 4 MG/2ML IJ SOLN
4.0000 mg | Freq: Once | INTRAMUSCULAR | Status: AC
  Administered 2012-01-09: 4 mg via INTRAVENOUS
  Filled 2012-01-09: qty 2

## 2012-01-09 MED ORDER — METHOCARBAMOL 100 MG/ML IJ SOLN
1000.0000 mg | Freq: Once | INTRAVENOUS | Status: AC
  Administered 2012-01-09: 1000 mg via INTRAVENOUS
  Filled 2012-01-09: qty 10

## 2012-01-09 MED ORDER — SODIUM CHLORIDE 0.9 % IV SOLN
INTRAVENOUS | Status: DC
  Administered 2012-01-09: 17:00:00 via INTRAVENOUS

## 2012-01-09 MED ORDER — FENTANYL CITRATE 0.05 MG/ML IJ SOLN
50.0000 ug | Freq: Once | INTRAMUSCULAR | Status: AC
  Administered 2012-01-09: 50 ug via INTRAVENOUS
  Filled 2012-01-09: qty 2

## 2012-01-09 MED ORDER — KETOROLAC TROMETHAMINE 30 MG/ML IJ SOLN
30.0000 mg | Freq: Once | INTRAMUSCULAR | Status: AC
  Administered 2012-01-09: 30 mg via INTRAVENOUS
  Filled 2012-01-09: qty 1

## 2012-01-09 NOTE — ED Notes (Addendum)
Low back pain , radiates down left leg, x 3 days.   No injury.  Pt is in a wheelchair, uses a cane at home and is able to move into her bed.Seen by chiropractor today.

## 2012-01-09 NOTE — ED Provider Notes (Signed)
History     CSN: [id number]  Arrival date & time 01/09/12  1531   First MD Initiated Contact with Patient 01/09/12 1619      Chief Complaint  Patient presents with  . Back Pain    (Consider location/radiation/quality/duration/timing/severity/associated sxs/prior treatment) HPI Comments: Patient is a 66 year old female who has a history of degenerative disc disease involving the lower back. She states that she frequently has to see a chiropractor to help her with her ongoing back pain. She is also on medications to assist with her pain. In the last 2-3 days she has noticed that she has severe pain when she steps down particularly on her left side and with certain movements. She has not had any recent accident or injury. She's not had any new procedures done. She has had some manipulations of her back by the chiropractic physician but no other changes. The patient denies any loss of bowel or bladder control. She denies any significant sensory changes of her left lower extremity. The patient was seen on yesterday December 12, and today December 13 by the chiropractic physician and was told to calm to the emergency department because of his concern of a" pinched nerve". She was told to come to the emergency department and have an MRI done. Patient has been taking her home medications with partial relief.  Patient is a 66 y.o. female presenting with back pain. The history is provided by the patient.  Back Pain  This is a chronic problem. Pertinent negatives include no chest pain, no abdominal pain and no dysuria.    Past Medical History  Diagnosis Date  . COPD (chronic obstructive pulmonary disease)   . Hypertension   . Asthma   . GERD (gastroesophageal reflux disease)   . Hiatal hernia   . Schatzki's ring   . Adenomatous polyps     Dr Gwenevere Abbot  . Spondylosis   . Hypercholesteremia   . Anxiety   . Hypothyroidism   . PONV (postoperative nausea and vomiting)   . Degenerative disc disease    . Chronic back pain   . Larynx disease     pt's left side if stiff and has protein deposits on it which causes her to lose air on that side and can't talk as well due to it  . Arthritis   . DM (diabetes mellitus)     when on prednisone  . Status post dilation of esophageal narrowing   . Fibromyalgia   . History of gallstones   . IBS (irritable bowel syndrome)   . History of pneumonia     Past Surgical History  Procedure Date  . Knee surgery     multiple knee arthroscopies  . Tonsillectomy and adenoidectomy   . Bladder suspension   . Partial hysterectomy   . Appendectomy   . Cholecystectomy   . Umbilical hernia repair   . Laparoscopy   . Carotid endarterectomy     right  . Sinus exploration     x 2  . Shoulder surgery     right  . Foot surgery     right  . Maloney dilation 12/18/2010    Procedure: [name] DILATION;  Surgeon: Corbin Ade, MD;  Location: [facility];  Service: Endoscopy;  Laterality: N/A;  #56 dilator  . Abdominal hysterectomy     partial  . Tubal ligation   . Cataract extraction w/phaco 03/24/2011    Procedure: CATARACT EXTRACTION PHACO AND INTRAOCULAR LENS PLACEMENT (IOC);  Surgeon: [name], MD;  Location: [facility];  Service: Ophthalmology;  Laterality: Left;  CDE 15.27  . Cataract extraction w/phaco 04/03/2011    Procedure: CATARACT EXTRACTION PHACO AND INTRAOCULAR LENS PLACEMENT (IOC);  Surgeon: [name], MD;  Location: [facility];  Service: Ophthalmology;  Laterality: Right;  CDE:7.41  . Breast excisional biopsy     left, x 3-4  . Breast excisional biopsy     right  . Larynx surgery     polyp removed  . Esophageal manometry 06/09/2011    Procedure: ESOPHAGEAL MANOMETRY (EM);  Surgeon: Beverley Fiedler, MD;  Location: [facility];  Service: Gastroenterology;  Laterality: N/A;    Family History  Problem Relation Age of Onset  . GER disease Mother   . Goiter Mother     malignant  . GER disease Father   . Anesthesia problems Neg Hx   . Malignant  hyperthermia Neg Hx   . Pseudochol deficiency Neg Hx   . Colon polyps Daughter   . Colon polyps Son   . Diabetes Sister   . Diabetes Brother   . Heart disease Mother   . Heart disease Father   . Heart disease Brother   . Irritable bowel syndrome Mother     History  Substance Use Topics  . Smoking status: Never Smoker   . Smokeless tobacco: Never Used  . Alcohol Use: No    OB History    Grav Para Term Preterm Abortions TAB SAB Ect Mult Living                  Review of Systems  Constitutional: Negative for activity change.       All ROS Neg except as noted in HPI  HENT: Negative for nosebleeds and neck pain.   Eyes: Negative for photophobia and discharge.  Respiratory: Positive for wheezing. Negative for cough and shortness of breath.   Cardiovascular: Negative for chest pain and palpitations.  Gastrointestinal: Negative for abdominal pain and blood in stool.  Genitourinary: Negative for dysuria, frequency and hematuria.  Musculoskeletal: Positive for back pain and arthralgias.  Skin: Negative.   Neurological: Negative for dizziness, seizures and speech difficulty.  Psychiatric/Behavioral: Negative for hallucinations and confusion. The patient is nervous/anxious.     Allergies  Sulfonamide derivatives; Adhesive; Amoxicillin-pot clavulanate; Avelox; Demeclocycline hcl; Doxycycline; Iodinated diagnostic agents; Iodine; Iohexol; Thorazine; Codeine; and Erythromycin  Home Medications   Current Outpatient Rx  Name  Route  Sig  Dispense  Refill  . ACETAMINOPHEN 500 MG PO TABS   Oral   Take 1,000 mg by mouth 2 (two) times daily.           . ALBUTEROL SULFATE HFA 108 (90 BASE) MCG/ACT IN AERS   Inhalation   Inhale 2 puffs into the lungs every 6 (six) hours as needed. For shortness of breath          . ALPRAZOLAM 0.5 MG PO TABS   Oral   Take 0.5 mg by mouth at bedtime.          Marland Kitchen AMLODIPINE BESYLATE 5 MG PO TABS   Oral   Take 5 mg by mouth daily.          . BECLOMETHASONE DIPROP MONOHYD 42 MCG/SPRAY NA SUSP   Nasal   Place 2 sprays into the nose 2 (two) times daily.          Marland Kitchen CARVEDILOL 6.25 MG PO TABS   Oral   Take 6.25 mg by mouth 2 (two) times daily with a  meal.         . CEFDINIR 300 MG PO CAPS   Oral   Take 1 capsule (300 mg total) by mouth 2 (two) times daily.   20 capsule   0   . CELEBREX 200 MG PO CAPS   Oral   Take 200 mg by mouth 2 (two) times daily.          Marland Kitchen CETIRIZINE HCL 10 MG PO TABS   Oral   Take 10 mg by mouth daily.         Marland Kitchen VITAMIN D 2000 UNITS PO TABS   Oral   Take 2,000 Units by mouth every morning.           Marland Kitchen CYANOCOBALAMIN 2500 MCG PO TABS   Oral   Take 1 tablet by mouth every morning.           Marland Kitchen DIPHENHYDRAMINE HCL 25 MG PO TABS   Oral   Take 25 mg by mouth at bedtime.         . FENOFIBRATE 160 MG PO TABS   Oral   Take 160 mg by mouth daily after breakfast.          . FLUTICASONE PROPIONATE 50 MCG/ACT NA SUSP   Nasal   Place 2 sprays into the nose daily.         Marland Kitchen HYDROCODONE-ACETAMINOPHEN 7.5-325 MG PO TABS   Oral   Take 1 tablet by mouth every 6 (six) hours as needed. For pain         . IBUPROFEN 200 MG PO TABS      Per bottle         . IPRATROPIUM-ALBUTEROL 0.5-2.5 (3) MG/3ML IN SOLN   Nebulization   Take 3 mLs by nebulization 4 (four) times daily as needed. For shortness of breath         . LEVOTHYROXINE SODIUM 100 MCG PO TABS   Oral   Take 100 mcg by mouth every morning.          Marland Kitchen MECLIZINE HCL 25 MG PO TABS   Oral   Take 25 mg by mouth 3 (three) times daily as needed. For dizziness         . METHOCARBAMOL 750 MG PO TABS   Oral   Take 750 mg by mouth at bedtime as needed.         . MOMETASONE FURO-FORMOTEROL FUM 100-5 MCG/ACT IN AERO   Inhalation   Inhale 2 puffs into the lungs 2 (two) times daily.         Marland Kitchen OLMESARTAN MEDOXOMIL-HCTZ 40-12.5 MG PO TABS   Oral   Take 0.5 tablets by mouth daily.          Marland Kitchen FISH OIL 1000 MG PO  CAPS   Oral   Take 1 capsule by mouth daily.         Marland Kitchen PANTOPRAZOLE SODIUM 40 MG PO TBEC   Oral   Take 1 tablet (40 mg total) by mouth 2 (two) times daily.   30 tablet   6   . MUCINEX CHILD COLD PO   Oral   Take by mouth as needed.         Marland Kitchen POTASSIUM CHLORIDE CRYS ER 10 MEQ PO TBCR   Oral   Take 10 mEq by mouth daily.          Marland Kitchen PRAVASTATIN SODIUM 20 MG PO TABS   Oral   Take 20 mg by mouth daily.         Marland Kitchen  PREDNISONE 10 MG PO TABS      4 tabs for 2 days, then 3 tabs for 2 days, 2 tabs for 2 days, then 1 tab for 2 days, then stop   20 tablet   0   . PRESCRIPTION MEDICATION   Oral   Take 5 mLs by mouth every 6 (six) hours as needed. [facility] compound  Of hydrocodone 5 mg/ 5 mL, phenylephrine 10 mg/  5 mL, chlorphenamine 4mg  / 5 mL with honey          . PROMETHAZINE HCL 25 MG PO TABS   Oral   Take 25 mg by mouth every 6 (six) hours as needed. For nausea         . RANITIDINE HCL 150 MG PO TABS   Oral   Take 150 mg by mouth at bedtime.         . RED YEAST RICE 600 MG PO CAPS   Oral   Take 1 capsule by mouth every morning.           Marland Kitchen SINGULAIR 10 MG PO TABS   Oral   Take 10 mg by mouth at bedtime.          Marland Kitchen VITAMIN E 1000 UNITS PO CAPS   Oral   Take 1,000 Units by mouth every morning.             BP 191/102  Pulse 79  Temp 97.5 F (36.4 C) (Oral)  Resp 20  Ht 5\' 2"  (1.575 m)  Wt 175 lb (79.379 kg)  BMI 32.01 kg/m2  SpO2 95%  Physical Exam  Nursing note and vitals reviewed. Constitutional: She is oriented to person, place, and time. She appears well-developed and well-nourished.  Non-toxic appearance.  HENT:  Head: Normocephalic.  Right Ear: Tympanic membrane and external ear normal.  Left Ear: Tympanic membrane and external ear normal.  Eyes: EOM and lids are normal. Pupils are equal, round, and reactive to light.  Neck: Normal range of motion. Neck supple. Carotid bruit is not present.  Cardiovascular: Normal rate,  regular rhythm, normal heart sounds, intact distal pulses and normal pulses.   Pulmonary/Chest: Breath sounds normal. No respiratory distress.  Abdominal: Soft. Bowel sounds are normal. There is no tenderness. There is no guarding.  Musculoskeletal: Normal range of motion.       Lumbar area pain with change of position and palpation. No palpable deformity appreciated. No hot areas appreciated. There is pain in the hip and pelvis area with left straight leg raises as well as abduction of the left hip.  Lymphadenopathy:       Head (right side): No submandibular adenopathy present.       Head (left side): No submandibular adenopathy present.    She has no cervical adenopathy.  Neurological: She is alert and oriented to person, place, and time. She has normal strength. No cranial nerve deficit or sensory deficit. She exhibits normal muscle tone. Coordination normal.       No sensory deficits appreciated. Patient can not stand putting weight on the left side due to pain.  Skin: Skin is warm and dry.  Psychiatric: She has a normal mood and affect. Her speech is normal.    ED Course  Procedures (including critical care time)  Labs Reviewed - No data to display No results found.   No diagnosis found.    MDM  I have reviewed nursing notes, vital signs, and all appropriate lab and imaging results for  this patient. Patient received some improvement in pain from IV pain medications. The MRI was compared to an MRI of October 29. There is a new extruded disc fragment at the L3-L4 area there is left L4 nerve root encroachment, with moderate central canal stenosis. The other areas at the L1-L2, L2-L3, L4-L5, and L5-S1 show no significant change. It is of note that on the previous MR and on today's MR that the patient had an incidental finding of a small pancreatic lesion. It is suggested that she have a CT abdomen or an MR of the abdomen for additional evaluation. A copy of the report has been given to  the patient to get her primary physician.   Suggested to the patient that she speak with her primary physician on next week.  she is to continue her current medications. I have strongly advised patient not to get up without using her walker. I've advised her not to do any excessive walking without using her motorized chair.         Kathie Dike, [place] 01/09/12 2047

## 2012-01-10 NOTE — ED Provider Notes (Signed)
Medical screening examination/treatment/procedure(s) were performed by non-physician practitioner and as supervising physician I was immediately available for consultation/collaboration.   Shelda Jakes, MD 01/10/12 [phone]

## 2012-01-13 ENCOUNTER — Other Ambulatory Visit: Payer: Self-pay | Admitting: Neurosurgery

## 2012-01-14 ENCOUNTER — Encounter (HOSPITAL_COMMUNITY): Payer: Self-pay | Admitting: Pharmacy Technician

## 2012-01-15 ENCOUNTER — Encounter (HOSPITAL_COMMUNITY)
Admission: RE | Admit: 2012-01-15 | Discharge: 2012-01-15 | Disposition: A | Discharge status: Home / Self Care | Payer: Medicare Other | Source: Ambulatory Visit | Attending: Neurosurgery | Admitting: Neurosurgery

## 2012-01-15 ENCOUNTER — Encounter (HOSPITAL_COMMUNITY): Payer: Self-pay

## 2012-01-15 LAB — CBC
HCT: 40.6 % (ref 36.0–46.0)
Hemoglobin: 13.6 g/dL (ref 12.0–15.0)
MCH: 30.8 pg (ref 26.0–34.0)
MCHC: 33.5 g/dL (ref 30.0–36.0)
MCV: 91.9 fL (ref 78.0–100.0)
Platelets: 188 10*3/uL (ref 150–400)
RBC: 4.42 MIL/uL (ref 3.87–5.11)
RDW: 13.7 % (ref 11.5–15.5)
WBC: 7.5 10*3/uL (ref 4.0–10.5)

## 2012-01-15 LAB — BASIC METABOLIC PANEL
BUN: 23 mg/dL (ref 6–23)
CO2: 28 mEq/L (ref 19–32)
Calcium: 9.7 mg/dL (ref 8.4–10.5)
Chloride: 105 mEq/L (ref 96–112)
Creatinine, Ser: 1.11 mg/dL — ABNORMAL HIGH (ref 0.50–1.10)
GFR calc Af Amer: 59 mL/min — ABNORMAL LOW (ref >90)
GFR calc non Af Amer: 51 mL/min — ABNORMAL LOW (ref >90)
Glucose, Bld: 95 mg/dL (ref 70–99)
Potassium: 4.3 mEq/L (ref 3.5–5.1)
Sodium: 144 mEq/L (ref 135–145)

## 2012-01-15 LAB — SURGICAL PCR SCREEN: MRSA, PCR: NEGATIVE

## 2012-01-15 NOTE — Pre-Procedure Instructions (Signed)
20 Krystal Powell  01/15/2012   Your procedure is scheduled on:  Monday December 23 at 0730 AMBring  Report to [facility] at 0530 AM.  Call this number if you have problems the morning of surgery: [phone]   Remember:   Do not eat food or drinkAfter Midnight.Sunday     Take these medicines the morning of surgery with A SIP OF WATER: Coreg, Synthroid,Meclizine, Oxycodone, Phenergan if needed, Protonix. Use inhalers and bring with you day of surgery.   Do not wear jewelry, make-up or nail polish.  Do not wear lotions, powders, or perfumes. You may wear deodorant.  Do not shave 48 hours prior to surgery.   Do not bring valuables to the hospital.  Contacts, dentures or bridgework may not be worn into surgery.  Leave suitcase in the car. After surgery it may be brought to your room.  For patients admitted to the hospital, checkout time is 11:00 AM the day of discharge.   Patients discharged the day of surgery will not be allowed to drive home.    Special Instructions: Shower using CHG 2 nights before surgery and the night before surgery.  If you shower the day of surgery use CHG.  Use special wash - you have one bottle of CHG for all showers.  You should use approximately 1/3 of the bottle for each shower.   Please read over the following fact sheets that you were given: Pain Booklet, Coughing and Deep Breathing, MRSA Information and Surgical Site Infection Prevention

## 2012-01-18 MED ORDER — VANCOMYCIN HCL IN DEXTROSE 1-5 GM/200ML-% IV SOLN
1000.0000 mg | INTRAVENOUS | Status: AC
  Administered 2012-01-19: 1000 mg via INTRAVENOUS
  Filled 2012-01-18: qty 200

## 2012-01-19 ENCOUNTER — Encounter (HOSPITAL_COMMUNITY): Payer: Self-pay | Admitting: *Deleted

## 2012-01-19 ENCOUNTER — Encounter (HOSPITAL_COMMUNITY)
Admission: RE | Disposition: A | Discharge status: Home / Self Care | Payer: Self-pay | Source: Ambulatory Visit | Attending: Neurosurgery

## 2012-01-19 ENCOUNTER — Inpatient Hospital Stay (HOSPITAL_COMMUNITY)
Admission: RE | Admit: 2012-01-19 | Discharge: 2012-01-20 | DRG: 491 | Disposition: A | Discharge status: Home / Self Care | Payer: Medicare Other | Source: Ambulatory Visit | Attending: Neurosurgery | Admitting: Neurosurgery

## 2012-01-19 ENCOUNTER — Encounter (HOSPITAL_COMMUNITY): Payer: Self-pay | Admitting: Anesthesiology

## 2012-01-19 ENCOUNTER — Inpatient Hospital Stay (HOSPITAL_COMMUNITY): Payer: Medicare Other

## 2012-01-19 ENCOUNTER — Inpatient Hospital Stay (HOSPITAL_COMMUNITY): Payer: Medicare Other | Admitting: Anesthesiology

## 2012-01-19 DIAGNOSIS — Z79899 Other long term (current) drug therapy: Secondary | ICD-10-CM

## 2012-01-19 DIAGNOSIS — M5126 Other intervertebral disc displacement, lumbar region: Principal | ICD-10-CM | POA: Diagnosis present

## 2012-01-19 DIAGNOSIS — Z01810 Encounter for preprocedural cardiovascular examination: Secondary | ICD-10-CM

## 2012-01-19 DIAGNOSIS — F411 Generalized anxiety disorder: Secondary | ICD-10-CM | POA: Diagnosis present

## 2012-01-19 DIAGNOSIS — E039 Hypothyroidism, unspecified: Secondary | ICD-10-CM | POA: Diagnosis present

## 2012-01-19 DIAGNOSIS — E119 Type 2 diabetes mellitus without complications: Secondary | ICD-10-CM | POA: Diagnosis present

## 2012-01-19 DIAGNOSIS — K449 Diaphragmatic hernia without obstruction or gangrene: Secondary | ICD-10-CM | POA: Diagnosis present

## 2012-01-19 DIAGNOSIS — J449 Chronic obstructive pulmonary disease, unspecified: Secondary | ICD-10-CM | POA: Diagnosis present

## 2012-01-19 DIAGNOSIS — K219 Gastro-esophageal reflux disease without esophagitis: Secondary | ICD-10-CM | POA: Diagnosis present

## 2012-01-19 DIAGNOSIS — Z01812 Encounter for preprocedural laboratory examination: Secondary | ICD-10-CM

## 2012-01-19 DIAGNOSIS — Z01818 Encounter for other preprocedural examination: Secondary | ICD-10-CM

## 2012-01-19 DIAGNOSIS — J4489 Other specified chronic obstructive pulmonary disease: Secondary | ICD-10-CM | POA: Diagnosis present

## 2012-01-19 HISTORY — PX: LUMBAR LAMINECTOMY/DECOMPRESSION MICRODISCECTOMY: SHX5026

## 2012-01-19 LAB — GLUCOSE, CAPILLARY: Glucose-Capillary: 126 mg/dL — ABNORMAL HIGH (ref 70–99)

## 2012-01-19 SURGERY — LUMBAR LAMINECTOMY/DECOMPRESSION MICRODISCECTOMY 1 LEVEL
Anesthesia: General | Laterality: Left | Wound class: Clean

## 2012-01-19 MED ORDER — LEVOTHYROXINE SODIUM 100 MCG PO TABS
100.0000 ug | ORAL_TABLET | Freq: Every day | ORAL | Status: DC
Start: 2012-01-20 — End: 2012-01-20
  Administered 2012-01-20: 100 ug via ORAL
  Filled 2012-01-19 (×2): qty 1

## 2012-01-19 MED ORDER — SCOPOLAMINE 1 MG/3DAYS TD PT72
MEDICATED_PATCH | TRANSDERMAL | Status: AC
  Filled 2012-01-19: qty 1

## 2012-01-19 MED ORDER — MOMETASONE FURO-FORMOTEROL FUM 100-5 MCG/ACT IN AERO
2.0000 | INHALATION_SPRAY | Freq: Two times a day (BID) | RESPIRATORY_TRACT | Status: DC
  Filled 2012-01-19: qty 8.8

## 2012-01-19 MED ORDER — DEXTROSE 5 % IV SOLN
500.0000 mg | Freq: Four times a day (QID) | INTRAVENOUS | Status: DC | PRN
  Filled 2012-01-19: qty 5

## 2012-01-19 MED ORDER — 0.9 % SODIUM CHLORIDE (POUR BTL) OPTIME
TOPICAL | Status: DC | PRN
  Administered 2012-01-19: 1000 mL

## 2012-01-19 MED ORDER — MENTHOL 3 MG MT LOZG: 1.0000 | LOZENGE | OROMUCOSAL | Status: DC | PRN

## 2012-01-19 MED ORDER — VITAMIN D 50 MCG (2000 UT) PO TABS: 2000.0000 [IU] | ORAL_TABLET | ORAL | Status: DC

## 2012-01-19 MED ORDER — METHOCARBAMOL 500 MG PO TABS: 500.0000 mg | ORAL_TABLET | Freq: Four times a day (QID) | ORAL | Status: DC | PRN

## 2012-01-19 MED ORDER — IPRATROPIUM BROMIDE 0.02 % IN SOLN: 0.5000 mg | Freq: Four times a day (QID) | RESPIRATORY_TRACT | Status: DC | PRN

## 2012-01-19 MED ORDER — ROCURONIUM BROMIDE 100 MG/10ML IV SOLN
INTRAVENOUS | Status: DC | PRN
  Administered 2012-01-19: 50 mg via INTRAVENOUS

## 2012-01-19 MED ORDER — NEOSTIGMINE METHYLSULFATE 1 MG/ML IJ SOLN
INTRAMUSCULAR | Status: DC | PRN
  Administered 2012-01-19: 4 mg via INTRAVENOUS

## 2012-01-19 MED ORDER — HYDRALAZINE HCL 20 MG/ML IJ SOLN
10.0000 mg | Freq: Once | INTRAMUSCULAR | Status: AC
  Administered 2012-01-19: 10 mg via INTRAVENOUS

## 2012-01-19 MED ORDER — THROMBIN 5000 UNITS EX SOLR
CUTANEOUS | Status: DC | PRN
  Administered 2012-01-19 (×2): 5000 [IU] via TOPICAL

## 2012-01-19 MED ORDER — OXYCODONE-ACETAMINOPHEN 5-325 MG PO TABS: 1.0000 | ORAL_TABLET | Freq: Four times a day (QID) | ORAL | Status: DC | PRN

## 2012-01-19 MED ORDER — OLMESARTAN MEDOXOMIL-HCTZ 40-12.5 MG PO TABS: 0.5000 | ORAL_TABLET | Freq: Every morning | ORAL | Status: DC

## 2012-01-19 MED ORDER — ALPRAZOLAM 0.5 MG PO TABS
0.5000 mg | ORAL_TABLET | Freq: Every day | ORAL | Status: DC
  Administered 2012-01-19: 0.5 mg via ORAL
  Filled 2012-01-19: qty 1

## 2012-01-19 MED ORDER — KETOROLAC TROMETHAMINE 30 MG/ML IJ SOLN
30.0000 mg | Freq: Four times a day (QID) | INTRAMUSCULAR | Status: DC
  Administered 2012-01-19 – 2012-01-20 (×3): 30 mg via INTRAVENOUS
  Filled 2012-01-19 (×7): qty 1

## 2012-01-19 MED ORDER — LORATADINE 10 MG PO TABS
10.0000 mg | ORAL_TABLET | Freq: Every day | ORAL | Status: DC
  Administered 2012-01-19 – 2012-01-20 (×2): 10 mg via ORAL
  Filled 2012-01-19 (×2): qty 1

## 2012-01-19 MED ORDER — MECLIZINE HCL 25 MG PO TABS
50.0000 mg | ORAL_TABLET | Freq: Four times a day (QID) | ORAL | Status: DC
  Administered 2012-01-19 – 2012-01-20 (×3): 50 mg via ORAL
  Filled 2012-01-19 (×7): qty 2

## 2012-01-19 MED ORDER — PROCHLORPERAZINE 25 MG RE SUPP
25.0000 mg | Freq: Two times a day (BID) | RECTAL | Status: DC | PRN
  Filled 2012-01-19: qty 1

## 2012-01-19 MED ORDER — HYDROMORPHONE HCL PF 1 MG/ML IJ SOLN
0.2500 mg | INTRAMUSCULAR | Status: DC | PRN
  Administered 2012-01-19 (×2): 0.5 mg via INTRAVENOUS

## 2012-01-19 MED ORDER — IPRATROPIUM-ALBUTEROL 0.5-2.5 (3) MG/3ML IN SOLN: 3.0000 mL | Freq: Four times a day (QID) | RESPIRATORY_TRACT | Status: DC | PRN

## 2012-01-19 MED ORDER — POTASSIUM CHLORIDE IN NACL 20-0.9 MEQ/L-% IV SOLN
INTRAVENOUS | Status: DC
  Administered 2012-01-19: 14:00:00 via INTRAVENOUS
  Filled 2012-01-19 (×3): qty 1000

## 2012-01-19 MED ORDER — LIDOCAINE HCL (CARDIAC) 20 MG/ML IV SOLN
INTRAVENOUS | Status: DC | PRN
  Administered 2012-01-19: 80 mg via INTRAVENOUS

## 2012-01-19 MED ORDER — CYANOCOBALAMIN 2500 MCG PO TABS: 1.0000 | ORAL_TABLET | Freq: Every morning | ORAL | Status: DC

## 2012-01-19 MED ORDER — PROPOFOL 10 MG/ML IV BOLUS
INTRAVENOUS | Status: DC | PRN
  Administered 2012-01-19: 150 mg via INTRAVENOUS

## 2012-01-19 MED ORDER — PANTOPRAZOLE SODIUM 40 MG PO TBEC
40.0000 mg | DELAYED_RELEASE_TABLET | Freq: Two times a day (BID) | ORAL | Status: DC
  Administered 2012-01-19 – 2012-01-20 (×2): 40 mg via ORAL
  Filled 2012-01-19 (×2): qty 1

## 2012-01-19 MED ORDER — MORPHINE SULFATE 2 MG/ML IJ SOLN: 1.0000 mg | INTRAMUSCULAR | Status: DC | PRN

## 2012-01-19 MED ORDER — OXYCODONE HCL 5 MG PO TABS
5.0000 mg | ORAL_TABLET | Freq: Once | ORAL | Status: DC | PRN
Start: 2012-01-19 — End: 2012-01-19

## 2012-01-19 MED ORDER — ONDANSETRON HCL 4 MG/2ML IJ SOLN
4.0000 mg | INTRAMUSCULAR | Status: DC | PRN
  Administered 2012-01-19 (×2): 4 mg via INTRAVENOUS
  Filled 2012-01-19 (×2): qty 2

## 2012-01-19 MED ORDER — LABETALOL HCL 5 MG/ML IV SOLN
INTRAVENOUS | Status: DC | PRN
  Administered 2012-01-19: 5 mg via INTRAVENOUS

## 2012-01-19 MED ORDER — LIDOCAINE-EPINEPHRINE 0.5 %-1:200000 IJ SOLN
INTRAMUSCULAR | Status: DC | PRN
  Administered 2012-01-19: 17 mL

## 2012-01-19 MED ORDER — CARVEDILOL 6.25 MG PO TABS
6.2500 mg | ORAL_TABLET | Freq: Two times a day (BID) | ORAL | Status: DC
  Administered 2012-01-19 – 2012-01-20 (×2): 6.25 mg via ORAL
  Filled 2012-01-19 (×4): qty 1

## 2012-01-19 MED ORDER — FENTANYL CITRATE 0.05 MG/ML IJ SOLN
INTRAMUSCULAR | Status: DC | PRN
  Administered 2012-01-19: 100 ug via INTRAVENOUS

## 2012-01-19 MED ORDER — FLUTICASONE PROPIONATE 50 MCG/ACT NA SUSP
1.0000 | Freq: Every day | NASAL | Status: DC
  Filled 2012-01-19: qty 16

## 2012-01-19 MED ORDER — GLYCOPYRROLATE 0.2 MG/ML IJ SOLN
INTRAMUSCULAR | Status: DC | PRN
  Administered 2012-01-19: 0.6 mg via INTRAVENOUS

## 2012-01-19 MED ORDER — HYDRALAZINE HCL 20 MG/ML IJ SOLN
INTRAMUSCULAR | Status: AC
  Filled 2012-01-19: qty 1

## 2012-01-19 MED ORDER — LACTATED RINGERS IV SOLN
INTRAVENOUS | Status: DC | PRN
  Administered 2012-01-19 (×2): via INTRAVENOUS

## 2012-01-19 MED ORDER — HEMOSTATIC AGENTS (NO CHARGE) OPTIME
TOPICAL | Status: DC | PRN
  Administered 2012-01-19: 1 via TOPICAL

## 2012-01-19 MED ORDER — ACETAMINOPHEN 10 MG/ML IV SOLN
1000.0000 mg | Freq: Four times a day (QID) | INTRAVENOUS | Status: AC
  Administered 2012-01-19 – 2012-01-20 (×4): 1000 mg via INTRAVENOUS
  Filled 2012-01-19 (×4): qty 100

## 2012-01-19 MED ORDER — MIDAZOLAM HCL 5 MG/5ML IJ SOLN
INTRAMUSCULAR | Status: DC | PRN
  Administered 2012-01-19: 2 mg via INTRAVENOUS

## 2012-01-19 MED ORDER — SODIUM CHLORIDE 0.9 % IJ SOLN
3.0000 mL | Freq: Two times a day (BID) | INTRAMUSCULAR | Status: DC
  Administered 2012-01-19 (×2): 3 mL via INTRAVENOUS

## 2012-01-19 MED ORDER — CYCLOBENZAPRINE HCL 10 MG PO TABS: 10.0000 mg | ORAL_TABLET | Freq: Three times a day (TID) | ORAL | Status: DC | PRN

## 2012-01-19 MED ORDER — HYDROMORPHONE HCL PF 1 MG/ML IJ SOLN
INTRAMUSCULAR | Status: AC
  Filled 2012-01-19: qty 1

## 2012-01-19 MED ORDER — ALBUTEROL SULFATE (5 MG/ML) 0.5% IN NEBU: 2.5000 mg | INHALATION_SOLUTION | Freq: Four times a day (QID) | RESPIRATORY_TRACT | Status: DC | PRN

## 2012-01-19 MED ORDER — ALBUTEROL SULFATE HFA 108 (90 BASE) MCG/ACT IN AERS: 2.0000 | INHALATION_SPRAY | Freq: Four times a day (QID) | RESPIRATORY_TRACT | Status: DC | PRN

## 2012-01-19 MED ORDER — OXYCODONE HCL 5 MG PO TABS: 5.0000 mg | ORAL_TABLET | ORAL | Status: DC | PRN

## 2012-01-19 MED ORDER — VITAMIN D3 25 MCG (1000 UNIT) PO TABS
2000.0000 [IU] | ORAL_TABLET | Freq: Every day | ORAL | Status: DC
  Administered 2012-01-20: 2000 [IU] via ORAL
  Filled 2012-01-19: qty 2

## 2012-01-19 MED ORDER — ONDANSETRON HCL 4 MG/2ML IJ SOLN: 4.0000 mg | Freq: Four times a day (QID) | INTRAMUSCULAR | Status: DC | PRN

## 2012-01-19 MED ORDER — PHENOL 1.4 % MT LIQD: 1.0000 | OROMUCOSAL | Status: DC | PRN

## 2012-01-19 MED ORDER — IRBESARTAN 150 MG PO TABS
150.0000 mg | ORAL_TABLET | Freq: Every day | ORAL | Status: DC
  Administered 2012-01-19 – 2012-01-20 (×2): 150 mg via ORAL
  Filled 2012-01-19 (×2): qty 1

## 2012-01-19 MED ORDER — HYDROCHLOROTHIAZIDE 10 MG/ML ORAL SUSPENSION
6.2500 mg | Freq: Every day | ORAL | Status: DC
  Administered 2012-01-19 – 2012-01-20 (×3): 6.25 mg via ORAL
  Filled 2012-01-19 (×2): qty 1.25

## 2012-01-19 MED ORDER — SODIUM CHLORIDE 0.9 % IJ SOLN: 3.0000 mL | INTRAMUSCULAR | Status: DC | PRN

## 2012-01-19 MED ORDER — SIMVASTATIN 10 MG PO TABS
10.0000 mg | ORAL_TABLET | Freq: Every day | ORAL | Status: DC
  Administered 2012-01-19: 10 mg via ORAL
  Filled 2012-01-19 (×2): qty 1

## 2012-01-19 MED ORDER — MONTELUKAST SODIUM 10 MG PO TABS
10.0000 mg | ORAL_TABLET | Freq: Every day | ORAL | Status: DC
  Administered 2012-01-19: 10 mg via ORAL
  Filled 2012-01-19 (×2): qty 1

## 2012-01-19 MED ORDER — ARTIFICIAL TEARS OP OINT
TOPICAL_OINTMENT | OPHTHALMIC | Status: DC | PRN
  Administered 2012-01-19: 1 via OPHTHALMIC

## 2012-01-19 MED ORDER — VITAMIN B-12 1000 MCG PO TABS
2500.0000 ug | ORAL_TABLET | Freq: Every day | ORAL | Status: DC
  Administered 2012-01-19 – 2012-01-20 (×2): 2500 ug via ORAL
  Filled 2012-01-19 (×2): qty 1

## 2012-01-19 MED ORDER — OXYCODONE HCL 5 MG/5ML PO SOLN: 5.0000 mg | Freq: Once | ORAL | Status: DC | PRN

## 2012-01-19 MED ORDER — FAMOTIDINE 20 MG PO TABS
20.0000 mg | ORAL_TABLET | Freq: Every day | ORAL | Status: DC
  Administered 2012-01-19 – 2012-01-20 (×2): 20 mg via ORAL
  Filled 2012-01-19 (×2): qty 1

## 2012-01-19 MED ORDER — POTASSIUM CHLORIDE CRYS ER 10 MEQ PO TBCR
10.0000 meq | EXTENDED_RELEASE_TABLET | Freq: Every morning | ORAL | Status: DC
  Administered 2012-01-20: 10 meq via ORAL
  Filled 2012-01-19 (×2): qty 1

## 2012-01-19 SURGICAL SUPPLY — 54 items
ADH SKN CLS APL DERMABOND .7 (GAUZE/BANDAGES/DRESSINGS) ×1
APL SKNCLS STERI-STRIP NONHPOA (GAUZE/BANDAGES/DRESSINGS)
BAG DECANTER FOR FLEXI CONT (MISCELLANEOUS) ×2 IMPLANT
BENZOIN TINCTURE PRP APPL 2/3 (GAUZE/BANDAGES/DRESSINGS) IMPLANT
BLADE SURG ROTATE 9660 (MISCELLANEOUS) IMPLANT
BUR MATCHSTICK NEURO 3.0 LAGG (BURR) ×2 IMPLANT
CANISTER SUCTION 2500CC (MISCELLANEOUS) ×2 IMPLANT
CLOTH BEACON ORANGE TIMEOUT ST (SAFETY) ×2 IMPLANT
CONT SPEC 4OZ CLIKSEAL STRL BL (MISCELLANEOUS) ×2 IMPLANT
DECANTER SPIKE VIAL GLASS SM (MISCELLANEOUS) ×2 IMPLANT
DERMABOND ADVANCED (GAUZE/BANDAGES/DRESSINGS) ×1
DERMABOND ADVANCED .7 DNX12 (GAUZE/BANDAGES/DRESSINGS) ×1 IMPLANT
DRAPE LAPAROTOMY 100X72X124 (DRAPES) ×2 IMPLANT
DRAPE MICROSCOPE LEICA (MISCELLANEOUS) ×2 IMPLANT
DRAPE POUCH INSTRU U-SHP 10X18 (DRAPES) ×2 IMPLANT
DRAPE SURG 17X23 STRL (DRAPES) ×2 IMPLANT
DURAPREP 26ML APPLICATOR (WOUND CARE) ×2 IMPLANT
ELECT REM PT RETURN 9FT ADLT (ELECTROSURGICAL) ×2
ELECTRODE REM PT RTRN 9FT ADLT (ELECTROSURGICAL) ×1 IMPLANT
GAUZE SPONGE 4X4 16PLY XRAY LF (GAUZE/BANDAGES/DRESSINGS) IMPLANT
GLOVE BIO SURGEON STRL SZ8 (GLOVE) ×2 IMPLANT
GLOVE ECLIPSE 6.5 STRL STRAW (GLOVE) ×2 IMPLANT
GLOVE ECLIPSE 7.5 STRL STRAW (GLOVE) ×2 IMPLANT
GLOVE EXAM NITRILE LRG STRL (GLOVE) IMPLANT
GLOVE EXAM NITRILE MD LF STRL (GLOVE) IMPLANT
GLOVE EXAM NITRILE XL STR (GLOVE) IMPLANT
GLOVE EXAM NITRILE XS STR PU (GLOVE) IMPLANT
GLOVE INDICATOR 8.0 STRL GRN (GLOVE) ×2 IMPLANT
GLOVE INDICATOR 8.5 STRL (GLOVE) ×2 IMPLANT
GOWN BRE IMP SLV AUR LG STRL (GOWN DISPOSABLE) ×3 IMPLANT
GOWN BRE IMP SLV AUR XL STRL (GOWN DISPOSABLE) ×2 IMPLANT
GOWN STRL REIN 2XL LVL4 (GOWN DISPOSABLE) ×2 IMPLANT
KIT BASIN OR (CUSTOM PROCEDURE TRAY) ×2 IMPLANT
KIT ROOM TURNOVER OR (KITS) ×2 IMPLANT
NDL HYPO 25X1 1.5 SAFETY (NEEDLE) ×1 IMPLANT
NDL SPNL 18GX3.5 QUINCKE PK (NEEDLE) IMPLANT
NEEDLE HYPO 25X1 1.5 SAFETY (NEEDLE) ×2 IMPLANT
NEEDLE SPNL 18GX3.5 QUINCKE PK (NEEDLE) ×2 IMPLANT
NS IRRIG 1000ML POUR BTL (IV SOLUTION) ×2 IMPLANT
PACK LAMINECTOMY NEURO (CUSTOM PROCEDURE TRAY) ×2 IMPLANT
PAD ARMBOARD 7.5X6 YLW CONV (MISCELLANEOUS) ×6 IMPLANT
RUBBERBAND STERILE (MISCELLANEOUS) ×4 IMPLANT
SPONGE GAUZE 4X4 12PLY (GAUZE/BANDAGES/DRESSINGS) IMPLANT
SPONGE LAP 4X18 X RAY DECT (DISPOSABLE) IMPLANT
SPONGE SURGIFOAM ABS GEL SZ50 (HEMOSTASIS) ×2 IMPLANT
STRIP CLOSURE SKIN 1/2X4 (GAUZE/BANDAGES/DRESSINGS) IMPLANT
SUT VIC AB 0 CT1 18XCR BRD8 (SUTURE) ×1 IMPLANT
SUT VIC AB 0 CT1 8-18 (SUTURE) ×2
SUT VIC AB 2-0 CT1 18 (SUTURE) ×2 IMPLANT
SUT VIC AB 3-0 SH 8-18 (SUTURE) ×2 IMPLANT
SYR 20ML ECCENTRIC (SYRINGE) ×2 IMPLANT
TOWEL OR 17X24 6PK STRL BLUE (TOWEL DISPOSABLE) ×2 IMPLANT
TOWEL OR 17X26 10 PK STRL BLUE (TOWEL DISPOSABLE) ×2 IMPLANT
WATER STERILE IRR 1000ML POUR (IV SOLUTION) ×2 IMPLANT

## 2012-01-19 NOTE — Op Note (Signed)
01/19/2012  10:50 AM  PATIENT:  Krystal Powell  66 y.o. female with significant pain in the left lower extremity. MRI scan shows a large herniated disc on the left side between L3 and L4.  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 Arvanitis was brought to the operating room intubated and placed under a general anesthetic without difficulty. She was positioned prone on a Wilson frame with all pressure points properly padded. Her back was prepped and she was draped in a sterile fashion. I infiltrated 10 cc 1/2% lidocaine into the lumbar region. I opened the skin with a #10 blade. I took my incision down to the thoracolumbar fascia. I exposed the lamina of L4 and L3 on the left side. I confirmed my location with an intraoperative x-ray I performed a semi-hemilaminectomy of L3 using a high-speed drill and Kerrison punches. I remove the ligamentum flavum and expose the thecal sac. I retracted the thecal sac medially under with microscopic dissection, encountered a large fragment of disc material. I used a right angle blunt tipped dissector and removed more fragments of disc. With Dr. Lezlie Lye assistants ensure that there was no compression of the thecal sac nerve root on the left side. The L4 root was free of compression. I did not into the disc space. Bleeding was controlled with hemostatic agents and irrigation. I inspected the nerve root in a rostrocaudal medial and lateral direction  and found to be free of compression. I closed the wound in layered fashion using Vicryl sutures with Dr. Lezlie Lye assistants. We approximated the thoracolumbar subcutaneous and subcuticular layers. I used Dermabond for a sterile dressing.  PLAN OF CARE: Admit to inpatient   PATIENT DISPOSITION:  PACU - hemodynamically stable.   Delay start of Pharmacological VTE agent (>24hrs) due to surgical blood loss or risk of bleeding:  yes

## 2012-01-19 NOTE — Anesthesia Preprocedure Evaluation (Signed)
Anesthesia Evaluation  Patient identified by MRN, date of birth, ID band Patient awake    Reviewed: Allergy & Precautions, H&P , NPO status , Patient's Chart, lab work & pertinent test results  History of Anesthesia Complications (+) PONV  Airway Mallampati: II  Neck ROM: full    Dental   Pulmonary asthma , COPD         Cardiovascular hypertension,     Neuro/Psych Anxiety  Neuromuscular disease    GI/Hepatic hiatal hernia, GERD-  ,  Endo/Other  diabetes, Type 2Hypothyroidism   Renal/GU      Musculoskeletal  (+) Fibromyalgia -  Abdominal   Peds  Hematology   Anesthesia Other Findings   Reproductive/Obstetrics                           Anesthesia Physical Anesthesia Plan  ASA: III  Anesthesia Plan: General   Post-op Pain Management:    Induction: Intravenous  Airway Management Planned: Oral ETT  Additional Equipment:   Intra-op Plan:   Post-operative Plan: Extubation in OR  Informed Consent: I have reviewed the patients History and Physical, chart, labs and discussed the procedure including the risks, benefits and alternatives for the proposed anesthesia with the patient or authorized representative who has indicated his/her understanding and acceptance.     Plan Discussed with: CRNA and Surgeon  Anesthesia Plan Comments:         Anesthesia Quick Evaluation

## 2012-01-19 NOTE — H&P (Signed)
BP 175/89  Pulse 79  Temp 97.8 F (36.6 C) (Oral)  Resp 20  SpO2 96%  Krystal Powell comes in today for evaluation of pain that she has had in her lower back. She says that for the last two weeks the pain has been somewhat more severe. She uses muscle relaxants each night.  She has received a steroid injection, but she is currently in a wheelchair, not because of her legs, but because of decreased lung capacity which she says is approximately 60%.  She feels a great deal of pain and pressure which radiates into the pelvis and down the thighs.  She has some urinary urgency.  She has been seeing a chiropractor because she has a long history of chronic pain and that has not changed significantly.  She is 66 years of age and right-handed.  She says she cannot bend over more than once before she gets more back pain.  She has degenerative bone disease. She says that this time the pain started October 18th when she bent over in the kitchen to do something relatively simple.  She has numbness and tingling in her arms and that is on occasion.  Headaches currently.  The housework that she was doing was mopping under couches and then she had a sharp pain in the lower back.  She had a TENS unit and had good relief, but then the pain recurred. She then was sent to undergo an MRI of the lumbar spine and then was sent to me for further evaluation.    PAST MEDICAL HISTORY:  Significant for hypertension, COPD, asthma, and a hiatal hernia. Medications are Alprazolam, Protonix, Beconase nasal spray, Benicar, Carvedilol, Ibuprofen, Dulera, Ipratropium/Albuterol, Klor-Con, Levothyroxine, Meclizine, Methocarbamol, Pravastatin, Proventil, Singulair, Zantac, and Zyrtec.  She is ALLERGIC TO IODINE, CODEINE, THORAZINE, AUGMENTIN, SULFA DRUGS, AVELOX, ERYTHROMYCIN, AND DOXYCYCLINE.    FAMILY HISTORY:    Her mother and father are both deceased.  Family history positive for heart disease, cerebrovascular disease, degenerative bone  disease, hypertension, and blood clots.    SOCIAL HISTORY:    She does not smoke.  She does not use alcohol.  She does not have a history of illicit drug use.  She says she has lost 25 lbs. in the last two years.    REVIEW OF SYSTEMS:   Positive for hearing loss, tinnitus, nasal congestion, hypertension, shortness of breath, COPD, asthma, bronchitis, indigestion, back pain, joint pain, arthritis, neck pain, thyroid disease, anxiety, and inhalant allergies.  She denies hematologic, neurological, skin, genitourinary, constitutional or eye problems.     PHYSICAL EXAMINATION:     She states that she is unable to bear weight on the left lower extremity.  She also states that she is doing much worse now than she did then.    On exam, she is unable to lift her left thigh against manual pressure, so the strength is 3/5.  The rest of her exam is unchanged.  Reflexes slightly diminished on the left compared to the right at the patella.  She is alert, oriented x 4, and answering all questions appropriately.  Memory, language, attention span, and fund of knowledge are normal. She is in mild distress.  Normal muscle tone, bulk, and coordination.   Krystal Powell needs to have operative decompression secondary to the significant weakness she has in the left lower extremity.  MRI shows a large fragment of disc which is extruded and compressing the left L4 nerve root. I think she needs a simple discectomy.  Risks and benefits of bleeding, infection, no relief, need for further surgery, fusion failure, hardware failure were discussed. She understands and wishes to proceed.

## 2012-01-19 NOTE — Anesthesia Postprocedure Evaluation (Signed)
Anesthesia Post Note  Patient: Krystal Powell  Procedure(s) Performed: Procedure(s) (LRB): LUMBAR LAMINECTOMY/DECOMPRESSION MICRODISCECTOMY 1 LEVEL (Left)  Anesthesia type: General  Patient location: PACU  Post pain: Pain level controlled and Adequate analgesia  Post assessment: Post-op Vital signs reviewed, Patient's Cardiovascular Status Stable, Respiratory Function Stable, Patent Airway and Pain level controlled  Last Vitals:  Filed Vitals:   01/19/12 1045  BP: 165/66  Pulse: 63  Temp:   Resp: 12    Post vital signs: Reviewed and stable  Level of consciousness: awake, alert  and oriented  Complications: No apparent anesthesia complications

## 2012-01-19 NOTE — Plan of Care (Signed)
Problem: Consults Goal: Diagnosis - Spinal Surgery Outcome: Completed/Met Date Met:  01/19/12 Microdiscectomy

## 2012-01-19 NOTE — Preoperative (Signed)
Beta Blockers   Reason not to administer Beta Blockers:Not Applicable 

## 2012-01-19 NOTE — Transfer of Care (Signed)
Immediate Anesthesia Transfer of Care Note  Patient: Krystal Powell  Procedure(s) Performed: Procedure(s) (LRB) with comments: LUMBAR LAMINECTOMY/DECOMPRESSION MICRODISCECTOMY 1 LEVEL (Left) - LEFT Lumbar three-four diskectomy  Patient Location: PACU  Anesthesia Type:General  Level of Consciousness: awake, alert , oriented and sedated  Airway & Oxygen Therapy: Patient Spontanous Breathing and Patient connected to nasal cannula oxygen  Post-op Assessment: Report given to PACU RN, Post -op Vital signs reviewed and stable and Patient moving all extremities  Post vital signs: Reviewed and stable  Complications: No apparent anesthesia complications

## 2012-01-20 ENCOUNTER — Encounter (HOSPITAL_COMMUNITY): Payer: Self-pay | Admitting: Neurosurgery

## 2012-01-20 NOTE — Discharge Summary (Signed)
  BP 130/92  Pulse 81  Temp 97.6 F (36.4 C) (Oral)  Resp 18  SpO2 92% Admitting dx: Lumbar herniated disc L L3/4 Discharge Dx: Lumbar herniated disc L L3/4 Surgeon: Franky Macho Destination: Home Comp: none Procedure: LUMBAR LAMINECTOMY/DECOMPRESSION MICRODISCECTOMY 1 LEVEL  Left L3-4  Microdissection Meds: Percocet 5/325 Flexeril Status: alive and well Voiding, ambulating, and tolerating a regular diet at discharge. Wound is clean, dry, and without signs of infection.

## 2012-02-09 ENCOUNTER — Encounter: Payer: Self-pay | Admitting: Internal Medicine

## 2012-02-09 ENCOUNTER — Ambulatory Visit (INDEPENDENT_AMBULATORY_CARE_PROVIDER_SITE_OTHER): Payer: Medicare Other | Admitting: Internal Medicine

## 2012-02-09 VITALS — BP 140/88 | HR 84 | Temp 98.0°F | Ht 62.0 in | Wt 186.8 lb

## 2012-02-09 DIAGNOSIS — R05 Cough: Secondary | ICD-10-CM

## 2012-02-09 DIAGNOSIS — J387 Other diseases of larynx: Secondary | ICD-10-CM

## 2012-02-09 DIAGNOSIS — R059 Cough, unspecified: Secondary | ICD-10-CM

## 2012-02-09 DIAGNOSIS — R918 Other nonspecific abnormal finding of lung field: Secondary | ICD-10-CM

## 2012-02-09 MED ORDER — GABAPENTIN 300 MG PO CAPS: ORAL_CAPSULE | ORAL | Status: DC

## 2012-02-09 NOTE — Progress Notes (Signed)
Subjective:    Patient ID: Krystal Powell, female    DOB: [birth year], 67 y.o.   MRN: [id number]  HPI HPI 67 yo female seen for initial pulmonary consult 05/16/11 for chronic cough and recurrent lung infection x 20 months. PCP is HALL,ZACK, MD   05/16/11 Consult for chronic cough Reports recurrent lung infection x 20 months. Reports episodes of fever, green mucus, cough, wheezing, weakness. Rx with antibiotics and prednsione that helps temporarily but recurs after a few days of completing course. Even between episodes does not feel fully fine; still has some cough. Only 1 admission for this; 05/01/11 - 05/04/11 and CXR showed LLL consolidation. Never smoked though parents smoked as child. Gives hx of GERD and gives hx of esophageal dilatation 5-6 times (Dr [facility] in [place]) and apparently refractory. Still with dysphagia - needs to take liquid to swallow food and has difficulty with large pills (cuts antibiotics into 4 pieces). She herself suspects she is aspirating food into lungs (gives hx of several episodes of coughing up food especially in last 20 months).   Overall main issue appears to be chronic daily cough. RSI cough score is 38 out of 40 and very c/w LPR cough. Describes Level 5 - post nasal drip, difficulty swallowing, breathing difficulty/choking episodies, annoying cough, sensation of something in throat, and hearburn. Level 4 - hoarseness of voice or cough after lying down  Recollects hx of PFT [facility] 2012 [place] that reportedly showed copd. 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 Lived in [place] for 15 years for asthma. REcently saw DR Willa Rough allergist who she says was nervous to do allergy tests due to pulmonary hx.. Hx of sinus surger in [place] over 20 years ago. Saw DR Suszanne Conners in [facility] last year. Reports polyps and constant sinus drainage.  REcollects vocal cord nodule ablation by Dr Del[facility] [place]  2013  06/04/2011 Acute OV c/o chillis, fever, congestion, cough with green mucus, runny nose, and pain in her lungs for last 4 days.  Lots of nasal drainage. Increased wheezing. OTC not helping. Feels bad with no energy.  Has CT chest planned next week. No hemoptysis, chest pain , no edema. Last ABX 1 month ago.   REC Hold fish Oil for 1 month then place in freezer  Omnicef 300 mg twice daily for 7 days, take with food.  Mucinex DM twice daily as needed for cough and congestion.  Saline nasal rinses as needed.  Fluids and rest.  Prednisone taper for the next week.  Followup as planned for CT scan next week.  follow up Dr. Marchelle Gearing in 2 weeks as planned  Please contact office for sooner follow up if symptoms do not improve or worsen or seek emergency care    OV 06/17/2011 [place] chronic cough   Cough persists unchanged and is severe and associated with mild on and off green sputum.  She did see NP earlier this month for acute bronchitis episode with wheizng and given omnicef and prednisone burst and was asked to stopp fish oil. Current RSI cough score is 37 and unchanged and c/w LPR cough.  Kouffman cough differentiator - she scored 5 for GE reflux and 4 for neurogenic cough suggesting  bouth strong ge reflux and neurogenic components for cough. CT chest 06/06/11 - esssentially clear to my review exceot for scattered nodules. She has see Dr Rhea Belton of GI and per  her hx had MBSS and esophageal manometry and both reportedly normal. FU and discussion about nissen fundoplication pending.   REviewed meds and noted she is on, flonase, zyrtec, benadryl, duoneb, dulera, singulair, protonix and znatac. Of note, she is on non specific beta blocker coreg through there is no hx of chf     CT CHEST 06/06/11 Comparison: None  Correlation: Chest radiograph 05/01/2011  Findings:  Gallbladder surgically absent.  Remaining visualized portions of upper abdomen normal appearance.  Minimal atherosclerotic  calcification.  Asymmetric density upper left breast 1.4 x 1.5 cm image 2.  Surgical clips left axilla.  No definite thoracic adenopathy.  Tracheobronchial cartilage calcification noted.  Atelectasis versus scarring in medial right upper lobe images 22-  27.  Additional atelectasis versus scarring in medial left lower lobe,  somewhat more focally dense.  Numerous tiny nodular foci noted throughout both lungs, some poorly  defined, measuring up to 5 mm diameter.  These are nonspecific in appearance of uncertain etiology; none are  definitively calcified to suggest old granulomatous disease.  No infiltrate, pleural effusion or pneumothorax.  Scattered end plate spur formation thoracic spine.  IMPRESSION:  Scattered areas of atelectasis versus scarring medial right upper  lobe and medial left lower lobe, slightly more prominent in left  lower lobe.  Numerous tiny noncalcified nodular foci throughout both lungs up to  5 mm greatest size, nonspecific.  If the patient is at high risk for bronchogenic carcinoma, follow-  up chest CT at 6-12 months is recommended. If the patient is at  low risk for bronchogenic carcinoma, follow-up chest CT at 12  months is recommended. This recommendation follows the consensus  statement: Guidelines for Management of Small Pulmonary Nodules  Detected on CT Scans: A Statement from the Fleischner Society as  published in Radiology 2005; 237:395-400.  0.  Original Report Authenticated By: Lollie Marrow, M.D.     [facility] It appears cough is due to acid reflux, neurogenic cough and maybe asthma but we need to rule out asthma component because the inhalers themselves could make cough worse  Hold dulera for 2 weeks but if you get worse call us immediately  Then have methacholine challenge test  Call for me to review those results  Further plan based on those results  Update 10:33 PM on 06/17/2011  - will cal PMD annd have him dc coreg in case there is asthma and  this non specific beta blocker is making it worse  OV 06/25/2011 - ACUTE VISIT Cough worse after stopping dulera while waiting for methacholine challenge. Worsening started 4 days after coming off dulera. Reports worsening cough that is dry, dyspnea, wheeze but no sputum. She saw GI yesterday and they are recommending current line of GERD mgmt with option of nissen fundoplication in future. Today RSI cough score is 34 (note she reports cough is worse but score is better/similar to last ov) and on cough differentiator she scored 4 out of 5 for GERD and 5 out of 5 for neurogenic cough. Spirometry today sows moderate obstuction  -fev1 1.38L/64%, Ratio 69. FVC 1.99L/73%, small airway 43%  Of note, she is now off coreg and off fish oil >>restart Dulera   12/29/2011 Acute OV  Complains of prod cough with green mucus, hoarseness, rattling in chest, increased SOB, chills/sweats x6 days.  Finished a pred pak yesterday that was left over.  Did not help. Still coughing up blood.  No chest pain, edema or fever.  otc cough meds not working.  Omnicef 300 mg twice daily for 10 days, take with food.  Mucinex DM twice daily as needed for cough and congestion.  Saline nasal rinses as needed.  Fluids and rest.  Prednisone taper for the next week.  Follow up Dr. Marchelle Gearing 6 weeks and As needed  Please contact office for sooner follow up if symptoms do not improve or worsen or seek emergency care    OV 02/09/2012  Overall cough better but still with significnat cough that is burdensome. Says she has a lot of kleenex due to cough. Cough is associtaed with green sputum and significant sinus drainage. For sinus: she uses nasal steroid, saline spray. Unable to do netti pot due to flow into ear. For allergies: sees Dr Al Decant. Says she is not eligible for allergy shots due to "lung issue". Apparently Dr Willa Rough told her, " you do not breathe welll enough for you to get allergy shots"/. For sinus: also sees Dr Kennedy Bucker who  apparentlyu has told her "you have congestion in your sinus". She gives hx feb 2013 of polyp removal from larynx at [facility]. For lungs: uses dulera. For GERD: protonix bid and zantac qhs. Back on fish oil due to resurgence of cholesteroil. Going back has not made cough worse.  In terms of BP: she is back on coreg because amlodipone made her tachy. RSI cough score improved from 37 to 27 but still because greater than 15 reflects LPR cough.    Dr Gretta Cool Reflux Symptom Index (> 13-15 suggestive of LPR cough) May 2013 02/09/2012   Hoarseness of problem with voice 5 3  Clearing  Of Throat 5 4  Excess throat mucus or feeling of post nasal drip 3 4  Difficulty swallowing food, liquid or tablets 3 3  Cough after eating or lying down 5 3  Breathing difficulties or choking episodes 3 3  Troublesome or annoying cough 5 3  Sensation of something sticking in throat or lump in throat 4 3  Heartburn, chest pain, indigestion, or stomach acid coming up 4 1  TOTAL 37 27     Kouffman Reflux v Neurogenic Cough Differentiator May 2013 02/09/2012   Do you awaken from a sound sleep coughing violently?                            With trouble breathing? Yes n  Do you have choking episodes when you cannot  Get enough air, gasping for air ?              Yes n  Do you usually cough when you lie down into  The bed, or when you just lie down to rest ?                          Yes y  Do you usually cough after meals or eating?         Yes sometime   4 1.5  Do you cough when (or after) you bend over?    Yes n  Do you more-or-less cough all day long? yes n  Does change of temperature make you cough? x y  Does laughing or chuckling cause you to cough? yes YEs  Do fumes (perfume, automobile fumes, burned  Toast, etc.,) cause you to cough ?      yes Yes  Does speaking, singing, or talking on the phone cause you to cough   ?  yes Yes  Total 4 3     Current outpatient prescriptions:acetaminophen  (TYLENOL) 500 MG tablet, Take 1,000 mg by mouth 2 (two) times daily.  , Disp: , Rfl: ;  albuterol (PROVENTIL HFA;VENTOLIN HFA) 108 (90 BASE) MCG/ACT inhaler, Inhale 2 puffs into the lungs every 6 (six) hours as needed. For shortness of breath , Disp: , Rfl: ;  ALPRAZolam (XANAX) 0.5 MG tablet, Take 0.5 mg by mouth at bedtime. , Disp: , Rfl:  beclomethasone (BECONASE-AQ) 42 MCG/SPRAY nasal spray, Place 2 sprays into the nose 2 (two) times daily. , Disp: , Rfl: ;  carvedilol (COREG) 6.25 MG tablet, Take 6.25 mg by mouth 2 (two) times daily with a meal., Disp: , Rfl: ;  cetirizine (ZYRTEC) 10 MG tablet, Take 10 mg by mouth at bedtime. , Disp: , Rfl: ;  Cholecalciferol (VITAMIN D) 2000 UNITS tablet, Take 2,000 Units by mouth every morning.  , Disp: , Rfl:  Cyanocobalamin 2500 MCG TABS, Take 1 tablet by mouth every morning. , Disp: , Rfl: ;  cyclobenzaprine (FLEXERIL) 10 MG tablet, Take 1 tablet (10 mg total) by mouth 3 (three) times daily as needed for muscle spasms., Disp: 45 tablet, Rfl: 0;  diphenhydrAMINE (BENADRYL) 25 MG tablet, Take 25 mg by mouth at bedtime., Disp: , Rfl: ;  ibuprofen (ADVIL,MOTRIN) 200 MG tablet, Take 400 mg by mouth 2 (two) times daily as needed. For pain, Disp: , Rfl:  ipratropium-albuterol (DUONEB) 0.5-2.5 (3) MG/3ML SOLN, Take 3 mLs by nebulization 4 (four) times daily as needed. For shortness of breath, Disp: , Rfl: ;  levothyroxine (SYNTHROID, LEVOTHROID) 100 MCG tablet, Take 100 mcg by mouth every morning. , Disp: , Rfl: ;  meclizine (ANTIVERT) 25 MG tablet, Take 50 mg by mouth 4 (four) times daily. For dizziness, Disp: , Rfl:  methocarbamol (ROBAXIN) 750 MG tablet, Take 750 mg by mouth at bedtime. , Disp: , Rfl: ;  mometasone-formoterol (DULERA) 100-5 MCG/ACT AERO, Inhale 2 puffs into the lungs 2 (two) times daily., Disp: , Rfl: ;  nitrofurantoin, macrocrystal-monohydrate, (MACROBID) 100 MG capsule, Take 100 mg by mouth 2 (two) times daily., Disp: , Rfl:    olmesartan-hydrochlorothiazide (BENICAR HCT) 40-12.5 MG per tablet, Take 0.5 tablets by mouth every morning. , Disp: , Rfl: ;  Omega-3 Fatty Acids (FISH OIL) 1000 MG CAPS, Take 1 capsule by mouth every morning. , Disp: , Rfl: ;  oxyCODONE-acetaminophen (PERCOCET) 7.5-325 MG per tablet, Take 1 tablet by mouth daily as needed. For pain, Disp: , Rfl:  oxyCODONE-acetaminophen (ROXICET) 5-325 MG per tablet, Take 1 tablet by mouth every 6 (six) hours as needed for pain., Disp: 80 tablet, Rfl: 0;  pantoprazole (PROTONIX) 40 MG tablet, Take 40 mg by mouth 2 (two) times daily. With breakfast and supper, Disp: , Rfl: ;  potassium chloride (K-DUR,KLOR-CON) 10 MEQ tablet, Take 10 mEq by mouth every morning. , Disp: , Rfl:  pravastatin (PRAVACHOL) 20 MG tablet, Take 20 mg by mouth at bedtime. , Disp: , Rfl: ;  promethazine (PHENERGAN) 25 MG tablet, Take 25 mg by mouth every 6 (six) hours as needed. For nausea, Disp: , Rfl: ;  ranitidine (ZANTAC) 150 MG tablet, Take 150 mg by mouth at bedtime., Disp: , Rfl: ;  Red Yeast Rice 600 MG CAPS, Take 1 capsule by mouth every morning.  , Disp: , Rfl: ;  SINGULAIR 10 MG tablet, Take 10 mg by mouth at bedtime. , Disp: , Rfl:  vitamin E 1000 UNIT capsule, Take 1,000 Units  by mouth every morning.  , Disp: , Rfl:      Review of Systems  Constitutional: Negative for fever and unexpected weight change.  HENT: Positive for postnasal drip and sinus pressure. Negative for ear pain, nosebleeds, congestion, sore throat, rhinorrhea, sneezing, trouble swallowing and dental problem.   Eyes: Negative for redness and itching.  Respiratory: Positive for cough. Negative for chest tightness, shortness of breath and wheezing.   Cardiovascular: Negative for palpitations and leg swelling.  Gastrointestinal: Negative for nausea and vomiting.  Genitourinary: Negative for dysuria.  Musculoskeletal: Negative for joint swelling.  Skin: Negative for rash.  Neurological: Negative for headaches.   Hematological: Does not bruise/bleed easily.  Psychiatric/Behavioral: Negative for dysphoric mood. The patient is not nervous/anxious.       Past, Family, Social reviewed: recent back surgery for acute sciatica and back pain  Objective:   Physical Exam GEN: A/Ox3; pleasant , NAD, well nourished in power chair   HEENT:  Ada/AT,  EACs-clear, TMs-wnl, NOSE-clear drainage , max tenderness  THROAT-clear, no lesions, no postnasal drip or exudate noted.   NECK:  Supple w/ fair ROM; no JVD; normal carotid impulses w/o bruits; no thyromegaly or nodules palpated; no lymphadenopathy.  RESP  Scattered rhonchi .no accessory muscle use, no dullness to percussion  CARD:  RRR, no m/r/g  , no peripheral edema, pulses intact, no cyanosis or clubbing.  GI:   Soft & nt; nml bowel sounds; no organomegaly or masses detected.  Musco: Warm bil, no deformities or joint swelling noted.   Neuro: alert, no focal deficits noted.    Skin: Warm, no lesions or rashes          Assessment & Plan:

## 2012-02-09 NOTE — Patient Instructions (Addendum)
#  Cough  - your cough will not go away as long as the irritants are there but we can work towards getting rid of it  - #for sinus    - do all the things you are doing but add neil med hypertonic saline spray 2 times each nostril at night  - will hold off antibiotics due to multiple allergies, chronic nature and lack of toxicity-  #acid reflux   -continue as before #asthma  - continue dulera #BP  - need to get off coreg; talk to PMD. THere is no way with asthma you can be on this medication #Irritable larynx  - Take gabapentin 300mg  once daily x 3 days, then 300mg  twice daily x 3 days, then 300mg  three times daily to continue. If this makes you too sleepy or drowsy call us and we will cut your medication dosing down - refer Mr Sundra Aland speech therapy   - at times you have urge to cough, drink water or chew on sugarless lozenge #Acid REflux  - [facility]  #Lung nodule - CT chest may 2014 followu[  #REturn  - 6 weeks to report progress  - cough score at followup

## 2012-02-10 DIAGNOSIS — R918 Other nonspecific abnormal finding of lung field: Secondary | ICD-10-CM | POA: Insufficient documentation

## 2012-02-10 NOTE — Assessment & Plan Note (Signed)
Lung nodule - CT chest may 2014 followup which is one year folllowup

## 2012-02-10 NOTE — Assessment & Plan Note (Signed)
#  Cough  - your cough will not go away as long as the irritants are there but we can work towards getting rid of it  - #for sinus    - do all the things you are doing but add neil med hypertonic saline spray 2 times each nostril at night  - will hold off antibiotics due to multiple allergies, chronic nature and lack of toxicity-  #acid reflux   -continue as before #asthma  - continue dulera #BP  - need to get off coreg; talk to PMD. THere is no way with asthma you can be on this medication #Irritable larynx  - Take gabapentin 300mg  once daily x 3 days, then 300mg  twice daily x 3 days, then 300mg  three times daily to continue. If this makes you too sleepy or drowsy call us and we will cut your medication dosing down - refer Mr Sundra Aland speech therapy   - at times you have urge to cough, drink water or chew on sugarless lozenge #Acid REflux  - per [facility]  # #REturn  - 6 weeks to report progress  - cough score at followup

## 2012-02-11 ENCOUNTER — Ambulatory Visit: Payer: Medicare Other | Attending: Internal Medicine

## 2012-02-11 DIAGNOSIS — R498 Other voice and resonance disorders: Secondary | ICD-10-CM | POA: Insufficient documentation

## 2012-02-11 DIAGNOSIS — IMO0001 Reserved for inherently not codable concepts without codable children: Secondary | ICD-10-CM | POA: Insufficient documentation

## 2012-02-17 ENCOUNTER — Ambulatory Visit: Payer: Medicare Other

## 2012-02-18 ENCOUNTER — Ambulatory Visit: Payer: Medicare Other | Admitting: Speech Pathology

## 2012-02-26 ENCOUNTER — Ambulatory Visit (INDEPENDENT_AMBULATORY_CARE_PROVIDER_SITE_OTHER): Payer: Medicare Other | Admitting: Internal Medicine

## 2012-02-26 ENCOUNTER — Other Ambulatory Visit: Payer: Medicare Other

## 2012-02-26 ENCOUNTER — Other Ambulatory Visit (INDEPENDENT_AMBULATORY_CARE_PROVIDER_SITE_OTHER): Payer: Medicare Other

## 2012-02-26 ENCOUNTER — Ambulatory Visit (INDEPENDENT_AMBULATORY_CARE_PROVIDER_SITE_OTHER)
Admission: RE | Admit: 2012-02-26 | Discharge: 2012-02-26 | Disposition: A | Discharge status: Home / Self Care | Payer: Medicare Other | Source: Ambulatory Visit | Attending: Internal Medicine | Admitting: Internal Medicine

## 2012-02-26 ENCOUNTER — Telehealth: Payer: Self-pay | Admitting: Internal Medicine

## 2012-02-26 ENCOUNTER — Encounter: Payer: Self-pay | Admitting: Internal Medicine

## 2012-02-26 VITALS — BP 120/78 | HR 124 | Temp 98.6°F | Ht 62.0 in

## 2012-02-26 DIAGNOSIS — R0781 Pleurodynia: Secondary | ICD-10-CM

## 2012-02-26 DIAGNOSIS — R509 Fever, unspecified: Secondary | ICD-10-CM

## 2012-02-26 DIAGNOSIS — J189 Pneumonia, unspecified organism: Secondary | ICD-10-CM

## 2012-02-26 DIAGNOSIS — R071 Chest pain on breathing: Secondary | ICD-10-CM

## 2012-02-26 LAB — CBC
HCT: 40.6 % (ref 36.0–46.0)
Hemoglobin: 13.7 g/dL (ref 12.0–15.0)
MCHC: 33.8 g/dL (ref 30.0–36.0)
MCV: 90.7 fl (ref 78.0–100.0)
Platelets: 247 10*3/uL (ref 150.0–400.0)
RDW: 13.3 % (ref 11.5–14.6)

## 2012-02-26 LAB — BASIC METABOLIC PANEL
BUN: 19 mg/dL (ref 6–23)
Calcium: 9.6 mg/dL (ref 8.4–10.5)
Chloride: 103 mEq/L (ref 96–112)
Creatinine, Ser: 1.1 mg/dL (ref 0.4–1.2)
GFR: 52.18 mL/min — ABNORMAL LOW (ref >60.00)

## 2012-02-26 LAB — D-DIMER, QUANTITATIVE: D-Dimer, Quant: 1.33 ug/mL-FEU — ABNORMAL HIGH (ref 0.00–0.48)

## 2012-02-26 MED ORDER — OSELTAMIVIR PHOSPHATE 75 MG PO CAPS: 75.0000 mg | ORAL_CAPSULE | Freq: Two times a day (BID) | ORAL | Status: DC

## 2012-02-26 MED ORDER — LEVOFLOXACIN 500 MG PO TABS: 500.0000 mg | ORAL_TABLET | Freq: Every day | ORAL | Status: DC

## 2012-02-26 NOTE — Telephone Encounter (Signed)
Pt advised. Graciana Sessa, CMA  

## 2012-02-26 NOTE — Telephone Encounter (Signed)
I already sent phone note on this patient. D-dimer likely high in context of fever and chills. High WBC all suggestive of CAP. Rx for pneumonia. If not better, then will proceed with PE workup.

## 2012-02-26 NOTE — Assessment & Plan Note (Signed)
CURB-65 Admission Decision Scoring, Level 1 Rec Score Patient  Confusion / Delirium 1 no  Uremia - BUN >/= 20mg % 1 no  Respiratory Rate >/= 30/min 1 no  Blood pressure </= 90sbp, or diastolic </= 60 1 no  Age >/= 65 1 yes  Score total (0-1 opd rx, 2 = admit, >/= 3 ?ICU) 6 1  30d mort 0=0.7%, 1=2%, 2=9%, 3=14%, 4=40%, 5=57%  2%    All features of her history and chest x-ray findings and elevated white count of 11.2 thousand also just that she has community-acquired pneumonia. Her CURB-65 score for severely index is only 1 and therefore this justifies outpatient treatment. She's allergic to multiple antibiotics. She gets nauseated with Avelox but has tolerated ciprofloxacin without a problem so we decided to do Levaquin. At her request of extended to 5 day treatment to 7 day treatment. Given the peak incidence of influenza this season despite her getting flu shot decided to cover her with Tamiflu. Both medications will be taken after food due to her propensity for nausea. She got a report in 24 hours through phone about her progress  Of note, her d-dimer is slightly high but I think this is due to her fever there is no other symptoms to suggest pulmonary embolism or DVT so we will continue to watch

## 2012-02-26 NOTE — Patient Instructions (Addendum)
You likely have a pneumonia Not sure if you have influenza despite getting flu shot Give blood for blood test and do a chest x-ray; if these are normal will have her call you and do a CT scan or another test of the chest to look for blood clot in the lung You absolutely need antibiotics despite multiple allergies to multiple antibiotics  -  take levaquin 500mg  once daily  X 7 days (extended duration based on your request]  - I do note that you have allergy to Avelox in terms of nausea and vomiting but because if taken ciprofloxacin in the past without any problems we're going to go ahead with Levaquin Hold nitrofurantoin while taking Levaquin Also take Tamiflu 75 mg twice a day for 5 days just in case you have influenza; please note this can make him nauseated and if so please call us and we can stop it Return to prior visit unless otherwise stated

## 2012-02-26 NOTE — Telephone Encounter (Signed)
Krystal Powell from [place] with critical on pt D-Dimer 1.33. Please advise. Carron Curie, CMA

## 2012-02-26 NOTE — Telephone Encounter (Signed)
Yes, flu could be primary with pna secondary

## 2012-02-26 NOTE — Telephone Encounter (Signed)
Pt advised. Does she need to still take the tamiflu? Carron Curie, CMA

## 2012-02-26 NOTE — Telephone Encounter (Signed)
Let her know results consistent with pneumonia. She is to call back in 24h with progress

## 2012-02-26 NOTE — Progress Notes (Signed)
Subjective:    Patient ID: Krystal Powell, female    DOB: March 01, 1945, 67 y.o.   MRN: [id number]  HPI HPI 67 yo female seen for initial pulmonary consult 05/16/11 for chronic cough and recurrent lung infection x 20 months. PCP is HALL,ZACK, MD   05/16/11 Consult for chronic cough Reports recurrent lung infection x 20 months. Reports episodes of fever, green mucus, cough, wheezing, weakness. Rx with antibiotics and prednsione that helps temporarily but recurs after a few days of completing course. Even between episodes does not feel fully fine; still has some cough. Only 1 admission for this; 05/01/11 - 05/04/11 and CXR showed LLL consolidation. Never smoked though parents smoked as child. Gives hx of GERD and gives hx of esophageal dilatation 5-6 times (Dr [facility]ell [facility] in [place]) and apparently refractory. Still with dysphagia - needs to take liquid to swallow food and has difficulty with large pills (cuts antibiotics into 4 pieces). She herself suspects she is aspirating food into lungs (gives hx of several episodes of coughing up food especially in last 20 months).   Overall main issue appears to be chronic daily cough. RSI cough score is 38 out of 40 and very c/w LPR cough. Describes Level 5 - post nasal drip, difficulty swallowing, breathing difficulty/choking episodies, annoying cough, sensation of something in throat, and hearburn. Level 4 - hoarseness of voice or cough after lying down  Recollects hx of PFT [facility] 2012 [place] that reportedly showed copd. 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 Lived in [place] for 15 years for asthma. REcently saw DR Willa Rough allergist who she says was nervous to do allergy tests due to pulmonary hx.. Hx of sinus surger in [place] over 20 years ago. Saw DR Suszanne Conners in [facility] last year. Reports polyps and constant sinus drainage.  REcollects vocal cord nodule ablation by Dr Del[facility] [place]  2013  06/04/2011 Acute OV c/o chillis, fever, congestion, cough with green mucus, runny nose, and pain in her lungs for last 4 days.  Lots of nasal drainage. Increased wheezing. OTC not helping. Feels bad with no energy.  Has CT chest planned next week. No hemoptysis, chest pain , no edema. Last ABX 1 month ago.   REC Hold fish Oil for 1 month then place in freezer  Omnicef 300 mg twice daily for 7 days, take with food.  Mucinex DM twice daily as needed for cough and congestion.  Saline nasal rinses as needed.  Fluids and rest.  Prednisone taper for the next week.  Followup as planned for CT scan next week.  follow up Dr. Marchelle Gearing in 2 weeks as planned  Please contact office for sooner follow up if symptoms do not improve or worsen or seek emergency care    OV 06/17/2011 [place] chronic cough   Cough persists unchanged and is severe and associated with mild on and off green sputum.  She did see NP earlier this month for acute bronchitis episode with wheizng and given omnicef and prednisone burst and was asked to stopp fish oil. Current RSI cough score is 37 and unchanged and c/w LPR cough.  Kouffman cough differentiator - she scored 5 for GE reflux and 4 for neurogenic cough suggesting  bouth strong ge reflux and neurogenic components for cough. CT chest 06/06/11 - esssentially clear to my review exceot for scattered nodules. She has see Dr Rhea Belton of GI and per  her hx had MBSS and esophageal manometry and both reportedly normal. FU and discussion about nissen fundoplication pending.   REviewed meds and noted she is on, flonase, zyrtec, benadryl, duoneb, dulera, singulair, protonix and znatac. Of note, she is on non specific beta blocker coreg through there is no hx of chf     CT CHEST 06/06/11 Comparison: None  Correlation: Chest radiograph 05/01/2011  Findings:  Gallbladder surgically absent.  Remaining visualized portions of upper abdomen normal appearance.  Minimal atherosclerotic  calcification.  Asymmetric density upper left breast 1.4 x 1.5 cm image 2.  Surgical clips left axilla.  No definite thoracic adenopathy.  Tracheobronchial cartilage calcification noted.  Atelectasis versus scarring in medial right upper lobe images 22-  27.  Additional atelectasis versus scarring in medial left lower lobe,  somewhat more focally dense.  Numerous tiny nodular foci noted throughout both lungs, some poorly  defined, measuring up to 5 mm diameter.  These are nonspecific in appearance of uncertain etiology; none are  definitively calcified to suggest old granulomatous disease.  No infiltrate, pleural effusion or pneumothorax.  Scattered end plate spur formation thoracic spine.  IMPRESSION:  Scattered areas of atelectasis versus scarring medial right upper  lobe and medial left lower lobe, slightly more prominent in left  lower lobe.  Numerous tiny noncalcified nodular foci throughout both lungs up to  5 mm greatest size, nonspecific.  If the patient is at high risk for bronchogenic carcinoma, follow-  up chest CT at 6-12 months is recommended. If the patient is at  low risk for bronchogenic carcinoma, follow-up chest CT at 12  months is recommended. This recommendation follows the consensus  statement: Guidelines for Management of Small Pulmonary Nodules  Detected on CT Scans: A Statement from the Fleischner Society as  published in Radiology 2005; 237:395-400.  0.  Original Report Authenticated By: Lollie Marrow, M.D.     [facility] It appears cough is due to acid reflux, neurogenic cough and maybe asthma but we need to rule out asthma component because the inhalers themselves could make cough worse  Hold dulera for 2 weeks but if you get worse call us immediately  Then have methacholine challenge test  Call for me to review those results  Further plan based on those results  Update 10:33 PM on 06/17/2011  - will cal PMD annd have him dc coreg in case there is asthma and  this non specific beta blocker is making it worse  OV 06/25/2011 - ACUTE VISIT Cough worse after stopping dulera while waiting for methacholine challenge. Worsening started 4 days after coming off dulera. Reports worsening cough that is dry, dyspnea, wheeze but no sputum. She saw GI yesterday and they are recommending current line of GERD mgmt with option of nissen fundoplication in future. Today RSI cough score is 34 (note she reports cough is worse but score is better/similar to last ov) and on cough differentiator she scored 4 out of 5 for GERD and 5 out of 5 for neurogenic cough. Spirometry today sows moderate obstuction  -fev1 1.38L/64%, Ratio 69. FVC 1.99L/73%, small airway 43%  Of note, she is now off coreg and off fish oil >>restart Dulera   12/29/2011 Acute OV  Complains of prod cough with green mucus, hoarseness, rattling in chest, increased SOB, chills/sweats x6 days.  Finished a pred pak yesterday that was left over.  Did not help. Still coughing up blood.  No chest pain, edema or fever.  otc cough meds not working.  Omnicef 300 mg twice daily for 10 days, take with food.  Mucinex DM twice daily as needed for cough and congestion.  Saline nasal rinses as needed.  Fluids and rest.  Prednisone taper for the next week.  Follow up Dr. Marchelle Gearing 6 weeks and As needed  Please contact office for sooner follow up if symptoms do not improve or worsen or seek emergency care    OV 02/09/2012  Overall cough better but still with significnat cough that is burdensome. Says she has a lot of kleenex due to cough. Cough is associtaed with green sputum and significant sinus drainage. For sinus: she uses nasal steroid, saline spray. Unable to do netti pot due to flow into ear. For allergies: sees Dr Al Decant. Says she is not eligible for allergy shots due to "lung issue". Apparently Dr Willa Rough told her, " you do not breathe welll enough for you to get allergy shots"/. For sinus: also sees Dr Kennedy Bucker who  apparentlyu has told her "you have congestion in your sinus". She gives hx feb 2013 of polyp removal from larynx at [facility]. For lungs: uses dulera. For GERD: protonix bid and zantac qhs. Back on fish oil due to resurgence of cholesteroil. Going back has not made cough worse.  In terms of BP: she is back on coreg because amlodipone made her tachy. RSI cough score improved from 37 to 27 but still because greater than 15 reflects LPR cough.      #Cough  - your cough will not go away as long as the irritants are there but we can work towards getting rid of it  - #for sinus  - do all the things you are doing but add neil med hypertonic saline spray 2 times each nostril at night  - will hold off antibiotics due to multiple allergies, chronic nature and lack of toxicity-  #acid reflux  -continue as before  #asthma  - continue dulera  #BP  - need to get off coreg; talk to PMD. THere is no way with asthma you can be on this medication  #Irritable larynx  - Take gabapentin 300mg  once daily x 3 days, then 300mg  twice daily x 3 days, then 300mg  three times daily to continue. If this makes you too sleepy or drowsy call us and we will cut your medication dosing down  - refer Mr Sundra Aland speech therapy  - at times you have urge to cough, drink water or chew on sugarless lozenge  #Acid REflux  - per [facility]  #Lung nodule  - CT chest may 2014 followu[  #REturn  - 6 weeks to report progress  - cough score at followup   OV 02/26/2012 acute visit for patient  Krystal Powell  After her last visit in starting Neurontin her cough has resolved as indicated by her RSI score of 3 compared to 27 in the past. However on February 24 2012 2 days ago, she developed acute onset fever with chills at night. This was associated with feeling of exhaustion and chills and right worse. The next morning which was yesterday she developed acute onset moderate pleuritic pain of the chest on the left infra-axillary area. As  stated before there is no increase in cough in fact cough is better but she is having some green sputum that is more than baseline. She denies any associated hemoptysis, edema. She generally feels worn out. There is no nausea or vomiting. Of note, she has had a flu shot this season. Is  no increased shortness of breath or wheezing   Dr Gretta Cool Reflux Symptom Index (> 13-15 suggestive of LPR cough) May 2013 02/09/2012  02/26/2012   Hoarseness of problem with voice 5 3 1   Clearing  Of Throat 5 4 1   Excess throat mucus or feeling of post nasal drip 3 4 1   Difficulty swallowing food, liquid or tablets 3 3 0  Cough after eating or lying down 5 3 0  Breathing difficulties or choking episodes 3 3 0  Troublesome or annoying cough 5 3 0  Sensation of something sticking in throat or lump in throat 4 3 0  Heartburn, chest pain, indigestion, or stomach acid coming up 4 1 0  TOTAL 37 27 3     Kouffman Reflux v Neurogenic Cough Differentiator May 2013 02/09/2012   Do you awaken from a sound sleep coughing violently?                            With trouble breathing? Yes n  Do you have choking episodes when you cannot  Get enough air, gasping for air ?              Yes n  Do you usually cough when you lie down into  The bed, or when you just lie down to rest ?                          Yes y  Do you usually cough after meals or eating?         Yes sometime   4 1.5  Do you cough when (or after) you bend over?    Yes n  Do you more-or-less cough all day long? yes n  Does change of temperature make you cough? x y  Does laughing or chuckling cause you to cough? yes YEs  Do fumes (perfume, automobile fumes, burned  Toast, etc.,) cause you to cough ?      yes Yes  Does speaking, singing, or talking on the phone cause you to cough   ?               yes Yes  Total 4 3   LABS Urine streptococcal antigen negative April 2013  D-dimer slightly high at 3  Lab 02/26/12 1215  NA 142  K 4.1  CL 103  CO2  30  GLUCOSE 127*  BUN 19  CREATININE 1.1  CALCIUM 9.6  MG --  PHOS --    Lab 02/26/12 1215  HGB 13.7  HCT 40.6  WBC 11.2*  PLT 247.0   Dg Chest 2 View  02/26/2012  *RADIOLOGY REPORT*  Clinical Data: Fever.  Pleuritic chest pain.  CHEST - 2 VIEW  Comparison: Two-view chest 01/15/2012.  Findings: Cardiac enlargement is stable.  Emphysematous changes are again noted.  A left pleural effusion and basilar airspace disease is noted.  Nodular densities are seen in the posterior mediastinum on the lateral view.  No definite disease is evident on the PA view. However, this is in the region of previous nodular disease on the CT scan.  The visualized soft tissues and bony thorax are unremarkable.  IMPRESSION:  1.  New left pleural effusion and basilar airspace disease.  While this may represent atelectasis, infection is not excluded. 2.  Nodular densities on the lateral view.  This could represent progression of pulmonary nodules.  Recommend CT of the  chest without contrast for further evaluation.   Original Report Authenticated By: Marin Roberts, M.D.      Current outpatient prescriptions:acetaminophen (TYLENOL) 500 MG tablet, Take 1,000 mg by mouth 2 (two) times daily.  , Disp: , Rfl: ;  albuterol (PROVENTIL HFA;VENTOLIN HFA) 108 (90 BASE) MCG/ACT inhaler, Inhale 2 puffs into the lungs every 6 (six) hours as needed. For shortness of breath , Disp: , Rfl: ;  ALPRAZolam (XANAX) 0.5 MG tablet, Take 0.5 mg by mouth at bedtime. , Disp: , Rfl:  beclomethasone (BECONASE-AQ) 42 MCG/SPRAY nasal spray, Place 2 sprays into the nose 2 (two) times daily. , Disp: , Rfl: ;  carvedilol (COREG) 6.25 MG tablet, Take 6.25 mg by mouth 2 (two) times daily with a meal., Disp: , Rfl: ;  cetirizine (ZYRTEC) 10 MG tablet, Take 10 mg by mouth at bedtime. , Disp: , Rfl: ;  Cholecalciferol (VITAMIN D) 2000 UNITS tablet, Take 2,000 Units by mouth every morning.  , Disp: , Rfl:  Cyanocobalamin 2500 MCG TABS, Take 1 tablet by  mouth every morning. , Disp: , Rfl: ;  cyclobenzaprine (FLEXERIL) 10 MG tablet, Take 1 tablet (10 mg total) by mouth 3 (three) times daily as needed for muscle spasms., Disp: 45 tablet, Rfl: 0;  diphenhydrAMINE (BENADRYL) 25 MG tablet, Take 25 mg by mouth at bedtime., Disp: , Rfl: ;  gabapentin (NEURONTIN) 300 MG capsule, Take 300 mg by mouth 2 (two) times daily., Disp: , Rfl:  ibuprofen (ADVIL,MOTRIN) 200 MG tablet, Take 400 mg by mouth 2 (two) times daily as needed. For pain, Disp: , Rfl: ;  ipratropium-albuterol (DUONEB) 0.5-2.5 (3) MG/3ML SOLN, Take 3 mLs by nebulization 4 (four) times daily as needed. For shortness of breath, Disp: , Rfl: ;  levothyroxine (SYNTHROID, LEVOTHROID) 100 MCG tablet, Take 100 mcg by mouth every morning. , Disp: , Rfl:  meclizine (ANTIVERT) 25 MG tablet, Take 50 mg by mouth 4 (four) times daily. For dizziness, Disp: , Rfl: ;  methocarbamol (ROBAXIN) 750 MG tablet, Take 750 mg by mouth at bedtime. , Disp: , Rfl: ;  mometasone-formoterol (DULERA) 100-5 MCG/ACT AERO, Inhale 2 puffs into the lungs 2 (two) times daily., Disp: , Rfl: ;  nitrofurantoin, macrocrystal-monohydrate, (MACROBID) 100 MG capsule, Take 100 mg by mouth 2 (two) times daily., Disp: , Rfl:  olmesartan-hydrochlorothiazide (BENICAR HCT) 40-12.5 MG per tablet, Take 0.5 tablets by mouth every morning. , Disp: , Rfl: ;  Omega-3 Fatty Acids (FISH OIL) 1000 MG CAPS, Take 1 capsule by mouth every morning. , Disp: , Rfl: ;  oxyCODONE-acetaminophen (PERCOCET) 7.5-325 MG per tablet, Take 1 tablet by mouth daily as needed. For pain, Disp: , Rfl:  oxyCODONE-acetaminophen (ROXICET) 5-325 MG per tablet, Take 1 tablet by mouth every 6 (six) hours as needed for pain., Disp: 80 tablet, Rfl: 0;  pantoprazole (PROTONIX) 40 MG tablet, Take 40 mg by mouth 2 (two) times daily. With breakfast and supper, Disp: , Rfl: ;  potassium chloride (K-DUR,KLOR-CON) 10 MEQ tablet, Take 10 mEq by mouth every morning. , Disp: , Rfl:  pravastatin  (PRAVACHOL) 20 MG tablet, Take 20 mg by mouth at bedtime. , Disp: , Rfl: ;  promethazine (PHENERGAN) 25 MG tablet, Take 25 mg by mouth every 6 (six) hours as needed. For nausea, Disp: , Rfl: ;  ranitidine (ZANTAC) 150 MG tablet, Take 150 mg by mouth at bedtime., Disp: , Rfl: ;  Red Yeast Rice 600 MG CAPS, Take 1 capsule by mouth every morning.  , Disp: ,  Rfl: ;  SINGULAIR 10 MG tablet, Take 10 mg by mouth at bedtime. , Disp: , Rfl:  vitamin E 1000 UNIT capsule, Take 1,000 Units by mouth every morning.  , Disp: , Rfl:     Review of Systems  Constitutional: Negative for fever and unexpected weight change.  HENT: Positive for congestion and sinus pressure. Negative for ear pain, nosebleeds, sore throat, rhinorrhea, sneezing, trouble swallowing, dental problem and postnasal drip.   Eyes: Negative for redness and itching.  Respiratory: Positive for cough and shortness of breath. Negative for chest tightness and wheezing.   Cardiovascular: Positive for chest pain. Negative for palpitations and leg swelling.  Gastrointestinal: Negative for nausea and vomiting.  Genitourinary: Negative for dysuria.  Musculoskeletal: Negative for joint swelling.  Skin: Negative for rash.  Neurological: Negative for headaches.  Hematological: Does not bruise/bleed easily.  Psychiatric/Behavioral: Negative for dysphoric mood. The patient is not nervous/anxious.        Objective:   Physical Exam  Vitals reviewed. Constitutional: She is oriented to person, place, and time. She appears well-developed and well-nourished. No distress.       Sitting in wheel chair Looks a little but more acutely ill than baseline and tired  HENT:  Head: Normocephalic and atraumatic.  Right Ear: External ear normal.  Left Ear: External ear normal.  Mouth/Throat: Oropharynx is clear and moist. No oropharyngeal exudate.  Eyes: Conjunctivae normal and EOM are normal. Pupils are equal, round, and reactive to light. Right eye exhibits no  discharge. Left eye exhibits no discharge. No scleral icterus.  Neck: Normal range of motion. Neck supple. No JVD present. No tracheal deviation present. No thyromegaly present.  Cardiovascular: Normal rate, regular rhythm, normal heart sounds and intact distal pulses.  Exam reveals no gallop and no friction rub.   No murmur heard. Pulmonary/Chest: Effort normal and breath sounds normal. No respiratory distress. She has no wheezes. She has no rales. She exhibits no tenderness.       Crackles in the left base worse than the right base  Abdominal: Soft. Bowel sounds are normal. She exhibits no distension and no mass. There is no tenderness. There is no rebound and no guarding.  Musculoskeletal: Normal range of motion. She exhibits no edema and no tenderness.  Lymphadenopathy:    She has no cervical adenopathy.  Neurological: She is alert and oriented to person, place, and time. She has normal reflexes. No cranial nerve deficit. She exhibits normal muscle tone. Coordination normal.  Skin: Skin is warm and dry. No rash noted. She is not diaphoretic. No erythema. No pallor.  Psychiatric: She has a normal mood and affect. Her behavior is normal. Judgment and thought content normal.   G     Assessment & Plan:

## 2012-02-28 LAB — STREP PNEUMONIAE URINARY ANTIGEN: Strep Pneumo Urinary Antigen: NOT DETECTED

## 2012-03-01 ENCOUNTER — Encounter: Payer: Medicare Other | Admitting: Speech Pathology

## 2012-03-05 ENCOUNTER — Telehealth: Payer: Self-pay | Admitting: Internal Medicine

## 2012-03-05 NOTE — Telephone Encounter (Signed)
Pt aware labs were normal per Dr. Marchelle Gearing and verbalized understanding.

## 2012-03-18 ENCOUNTER — Other Ambulatory Visit: Payer: Medicare Other

## 2012-03-18 ENCOUNTER — Encounter: Payer: Self-pay | Admitting: Internal Medicine

## 2012-03-18 ENCOUNTER — Ambulatory Visit (INDEPENDENT_AMBULATORY_CARE_PROVIDER_SITE_OTHER): Payer: Medicare Other | Admitting: Internal Medicine

## 2012-03-18 VITALS — BP 118/78 | HR 78 | Temp 97.8°F | Ht 62.0 in | Wt 183.8 lb

## 2012-03-18 DIAGNOSIS — R059 Cough, unspecified: Secondary | ICD-10-CM

## 2012-03-18 DIAGNOSIS — R053 Chronic cough: Secondary | ICD-10-CM

## 2012-03-18 DIAGNOSIS — R091 Pleurisy: Secondary | ICD-10-CM

## 2012-03-18 DIAGNOSIS — J189 Pneumonia, unspecified organism: Secondary | ICD-10-CM

## 2012-03-18 DIAGNOSIS — R05 Cough: Secondary | ICD-10-CM

## 2012-03-18 NOTE — Assessment & Plan Note (Signed)
This is very minimal to resolved. Gabapentin was the best  Plan - Continue to aggressively control sinus, acid reflux, asthma and irritable larynx control as always an as before - Advised to talk to PCP [facility], MD and change her Coreg to bisoprolol

## 2012-03-18 NOTE — Progress Notes (Signed)
Subjective:    Patient ID: Krystal Powell, female    DOB: Jun 11, 1945, 67 y.o.   MRN: [id number]  HPI  HPI HPI 67 yo female seen for initial pulmonary consult 05/16/11 for chronic cough and recurrent lung infection x 20 months. PCP is HALL,ZACK, MD   05/16/11 Consult for chronic cough Reports recurrent lung infection x 20 months. Reports episodes of fever, green mucus, cough, wheezing, weakness. Rx with antibiotics and prednsione that helps temporarily but recurs after a few days of completing course. Even between episodes does not feel fully fine; still has some cough. Only 1 admission for this; 05/01/11 - 05/04/11 and CXR showed LLL consolidation. Never smoked though parents smoked as child. Gives hx of GERD and gives hx of esophageal dilatation 5-6 times (Dr Kendell [facility] in [place]) and apparently refractory. Still with dysphagia - needs to take liquid to swallow food and has difficulty with large pills (cuts antibiotics into 4 pieces). She herself suspects she is aspirating food into lungs (gives hx of several episodes of coughing up food especially in last 20 months).   Overall main issue appears to be chronic daily cough. RSI cough score is 38 out of 40 and very c/w LPR cough. Describes Level 5 - post nasal drip, difficulty swallowing, breathing difficulty/choking episodies, annoying cough, sensation of something in throat, and hearburn. Level 4 - hoarseness of voice or cough after lying down  Recollects hx of PFT [facility] 2012 in [place] that reportedly showed copd. 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 Lived in [place] for 15 years for asthma. REcently saw DR Willa Rough allergist who she says was nervous to do allergy tests due to pulmonary hx.. Hx of sinus surger in [place] over 20 years ago. Saw DR Suszanne Conners in [place] last year. Reports polyps and constant sinus drainage.  REcollects vocal cord nodule ablation by Dr Deland [facility] [place]  [place]  06/04/2011 Acute OV c/o chillis, fever, congestion, cough with green mucus, runny nose, and pain in her lungs for last 4 days.  Lots of nasal drainage. Increased wheezing. OTC not helping. Feels bad with no energy.  Has CT chest planned next week. No hemoptysis, chest pain , no edema. Last ABX 1 month ago.   REC Hold fish Oil for 1 month then place in freezer  Omnicef 300 mg twice daily for 7 days, take with food.  Mucinex DM twice daily as needed for cough and congestion.  Saline nasal rinses as needed.  Fluids and rest.  Prednisone taper for the next week.  Followup as planned for CT scan next week.  follow up Dr. Marchelle Gearing in 2 weeks as planned  Please contact office for sooner follow up if symptoms do not improve or worsen or seek emergency care    OV 06/17/2011 [place] chronic cough   Cough persists unchanged and is severe and associated with mild on and off green sputum.  She did see NP earlier this month for acute bronchitis episode with wheizng and given omnicef and prednisone burst and was asked to stopp fish oil. Current RSI cough score is 37 and unchanged and c/w LPR cough.  Kouffman cough differentiator - she scored 5 for GE reflux and 4 for neurogenic cough suggesting  bouth strong ge reflux and neurogenic components for cough. CT chest 06/06/11 - esssentially clear to my review exceot for scattered nodules. She has see Dr Rhea Belton of GI  and per her hx had MBSS and esophageal manometry and both reportedly normal. FU and discussion about nissen fundoplication pending.   REviewed meds and noted she is on, flonase, zyrtec, benadryl, duoneb, dulera, singulair, protonix and znatac. Of note, she is on non specific beta blocker coreg through there is no hx of chf     CT CHEST 06/06/11 Comparison: None  Correlation: Chest radiograph 05/01/2011  Findings:  Gallbladder surgically absent.  Remaining visualized portions of upper abdomen normal appearance.  Minimal  atherosclerotic calcification.  Asymmetric density upper left breast 1.4 x 1.5 cm image 2.  Surgical clips left axilla.  No definite thoracic adenopathy.  Tracheobronchial cartilage calcification noted.  Atelectasis versus scarring in medial right upper lobe images 22-  27.  Additional atelectasis versus scarring in medial left lower lobe,  somewhat more focally dense.  Numerous tiny nodular foci noted throughout both lungs, some poorly  defined, measuring up to 5 mm diameter.  These are nonspecific in appearance of uncertain etiology; none are  definitively calcified to suggest old granulomatous disease.  No infiltrate, pleural effusion or pneumothorax.  Scattered end plate spur formation thoracic spine.  IMPRESSION:  Scattered areas of atelectasis versus scarring medial right upper  lobe and medial left lower lobe, slightly more prominent in left  lower lobe.  Numerous tiny noncalcified nodular foci throughout both lungs up to  5 mm greatest size, nonspecific.  If the patient is at high risk for bronchogenic carcinoma, follow-  up chest CT at 6-12 months is recommended. If the patient is at  low risk for bronchogenic carcinoma, follow-up chest CT at 12  months is recommended. This recommendation follows the consensus  statement: Guidelines for Management of Small Pulmonary Nodules  Detected on CT Scans: A Statement from the Fleischner Society as  published in Radiology 2005; 237:395-400.  0.  Original Report Authenticated By: Lollie Marrow, M.D.     [facility] It appears cough is due to acid reflux, neurogenic cough and maybe asthma but we need to rule out asthma component because the inhalers themselves could make cough worse  Hold dulera for 2 weeks but if you get worse call us immediately  Then have methacholine challenge test  Call for me to review those results  Further plan based on those results  Update 10:33 PM on 06/17/2011  - will cal PMD annd have him dc coreg in case  there is asthma and this non specific beta blocker is making it worse  OV 06/25/2011 - ACUTE VISIT Cough worse after stopping dulera while waiting for methacholine challenge. Worsening started 4 days after coming off dulera. Reports worsening cough that is dry, dyspnea, wheeze but no sputum. She saw GI yesterday and they are recommending current line of GERD mgmt with option of nissen fundoplication in future. Today RSI cough score is 34 (note she reports cough is worse but score is better/similar to last ov) and on cough differentiator she scored 4 out of 5 for GERD and 5 out of 5 for neurogenic cough. Spirometry today sows moderate obstuction  -fev1 1.38L/64%, Ratio 69. FVC 1.99L/73%, small airway 43%  Of note, she is now off coreg and off fish oil >>restart Dulera   12/29/2011 Acute OV  Complains of prod cough with green mucus, hoarseness, rattling in chest, increased SOB, chills/sweats x6 days.  Finished a pred pak yesterday that was left over.  Did not help. Still coughing up blood.  No chest pain, edema or fever.  otc cough meds  not working.    Omnicef 300 mg twice daily for 10 days, take with food.  Mucinex DM twice daily as needed for cough and congestion.  Saline nasal rinses as needed.  Fluids and rest.  Prednisone taper for the next week.  Follow up Dr. Marchelle Gearing 6 weeks and As needed  Please contact office for sooner follow up if symptoms do not improve or worsen or seek emergency care    OV 02/09/2012  Overall cough better but still with significnat cough that is burdensome. Says she has a lot of kleenex due to cough. Cough is associtaed with green sputum and significant sinus drainage. For sinus: she uses nasal steroid, saline spray. Unable to do netti pot due to flow into ear. For allergies: sees Dr Al Decant. Says she is not eligible for allergy shots due to "lung issue". Apparently Dr Willa Rough told her, " you do not breathe welll enough for you to get allergy shots"/. For sinus:  also sees Dr Kennedy Bucker who apparentlyu has told her "you have congestion in your sinus". She gives hx feb 2013 of polyp removal from larynx at [facility]. For lungs: uses dulera. For GERD: protonix bid and zantac qhs. Back on fish oil due to resurgence of cholesteroil. Going back has not made cough worse.  In terms of BP: she is back on coreg because amlodipone made her tachy. RSI cough score improved from 37 to 27 but still because greater than 15 reflects LPR cough.      #Cough  - your cough will not go away as long as the irritants are there but we can work towards getting rid of it  - #for sinus  - do all the things you are doing but add neil med hypertonic saline spray 2 times each nostril at night  - will hold off antibiotics due to multiple allergies, chronic nature and lack of toxicity-  #acid reflux  -continue as before  #asthma  - continue dulera  #BP  - need to get off coreg; talk to PMD. THere is no way with asthma you can be on this medication  #Irritable larynx  - Take gabapentin 300mg  once daily x 3 days, then 300mg  twice daily x 3 days, then 300mg  three times daily to continue. If this makes you too sleepy or drowsy call us and we will cut your medication dosing down  - refer Mr Sundra Aland speech therapy  - at times you have urge to cough, drink water or chew on sugarless lozenge  #Acid REflux  - per [facility]  #Lung nodule  - CT chest may 2014 followu[  #REturn  - 6 weeks to report progress  - cough score at followup   OV 02/26/2012 acute visit for patient  Krystal Powell  After her last visit in starting Neurontin her cough has resolved as indicated by her RSI score of 3 compared to 27 in the past. However on February 24 2012 2 days ago, she developed acute onset fever with chills at night. This was associated with feeling of exhaustion and chills and right worse. The next morning which was yesterday she developed acute onset moderate pleuritic pain of the chest on the left  infra-axillary area. As stated before there is no increase in cough in fact cough is better but she is having some green sputum that is more than baseline. She denies any associated hemoptysis, edema. She generally feels worn out. There is no nausea or vomiting. Of note, she has had a  flu shot this season. Is no increased shortness of breath or wheezing     LABS Urine streptococcal antigen negative April 2013  D-dimer slightly high at 3  Lab 02/26/12 1215  NA 142  K 4.1  CL 103  CO2 30  GLUCOSE 127*  BUN 19  CREATININE 1.1  CALCIUM 9.6  MG --  PHOS --    Lab 02/26/12 1215  HGB 13.7  HCT 40.6  WBC 11.2*  PLT 247.0   Dg Chest 2 View  02/26/2012  *RADIOLOGY REPORT*  Clinical Data: Fever.  Pleuritic chest pain.  CHEST - 2 VIEW  Comparison: Two-view chest 01/15/2012.  Findings: Cardiac enlargement is stable.  Emphysematous changes are again noted.  A left pleural effusion and basilar airspace disease is noted.  Nodular densities are seen in the posterior mediastinum on the lateral view.  No definite disease is evident on the PA view. However, this is in the region of previous nodular disease on the CT scan.  The visualized soft tissues and bony thorax are unremarkable.  IMPRESSION:  1.  New left pleural effusion and basilar airspace disease.  While this may represent atelectasis, infection is not excluded. 2.  Nodular densities on the lateral view.  This could represent progression of pulmonary nodules.  Recommend CT of the chest without contrast for further evaluation.   Original Report Authenticated By: Marin Roberts, M.D.     [facility] You likely have a pneumonia  Not sure if you have influenza despite getting flu shot  Give blood for blood test and do a chest x-ray; if these are normal will have her call you and do a CT scan or another test of the chest to look for blood clot in the lung  You absolutely need antibiotics despite multiple allergies to multiple antibiotics  - take  levaquin 500mg  once daily X 7 days (extended duration based on your request]  - I do note that you have allergy to Avelox in terms of nausea and vomiting but because if taken ciprofloxacin in the past without any problems we're going to go ahead with Levaquin  Hold nitrofurantoin while taking Levaquin  Also take Tamiflu 75 mg twice a day for 5 days just in case you have influenza; please note this can make him nauseated and if so please call us and we can stop it  Return to prior visit unless otherwise stated  OV 03/18/2012  Followup for chronic cough and also recent pneumonia end January 2014  - Chronic cough is almost resolved. She says gabapentin was the best drug for  Cough. Currently RSI score is 5 and a similar to last visit. She still has some mild amount of green sputum which she says is similar to baseline. She thinks this is from postnasal drip. She says is compliant with sinus, acid reflux, asthma and irritable larynx treatment. She's completed speech therapy and is being discharged  -. In terms of pneumonia last month: The antibiotic Levaquin work well without any side effects. Also acute symptomatology of fever chills left pleuritic chest pain have all resolved. However 3 days into treatment for pneumonia she developed acute pleuritic chest pain on the contralateral side which is the right side. Apparently this pain was moderate in intensity and lasted for 3 days and resolve spontaneously. There is no associated edema or hemoptysis at this time. Of note, at the time of diagnosis of left lower lobe pneumonia with pleural effusion her d-dimer was slightly high at 3 and but  we deferred pulmonary embolism workup    Dr Gretta Cool Reflux Symptom Index (> 13-15 suggestive of LPR cough) May 2013 02/09/2012  02/26/2012 LL pna, acutely ill 03/18/2012   Hoarseness of problem with voice 5 3 1 1   Clearing  Of Throat 5 4 1 1   Excess throat mucus or feeling of post nasal drip 3 4 1 2   Difficulty  swallowing food, liquid or tablets 3 3 0 0  Cough after eating or lying down 5 3 0 0  Breathing difficulties or choking episodes 3 3 0 0  Troublesome or annoying cough 5 3 0 0  Sensation of something sticking in throat or lump in throat 4 3 0 0  Heartburn, chest pain, indigestion, or stomach acid coming up 4 1 0 1  TOTAL 37 27 3 5      Kouffman Reflux v Neurogenic Cough Differentiator May 2013 02/09/2012  03/18/2012   Do you awaken from a sound sleep coughing violently?                            With trouble breathing? Yes n n  Do you have choking episodes when you cannot  Get enough air, gasping for air ?              Yes n n  Do you usually cough when you lie down into  The bed, or when you just lie down to rest ?                          Yes y sometimes  Do you usually cough after meals or eating?         Yes sometime n   4 1.5 0.5  Do you cough when (or after) you bend over?    Yes n n  Do you more-or-less cough all day long? yes n no  Does change of temperature make you cough? x y sometimes  Does laughing or chuckling cause you to cough? yes YEs sometimes  Do fumes (perfume, automobile fumes, burned  Toast, etc.,) cause you to cough ?      yes Yes sometimes  Does speaking, singing, or talking on the phone cause you to cough   ?               yes Yes Sometimes  Total 4 3 2     Review of Systems  Constitutional: Positive for activity change and fatigue. Negative for fever, chills, diaphoresis, appetite change and unexpected weight change.  HENT: Positive for voice change.   Eyes: Negative.   Respiratory: Positive for cough. Negative for apnea, chest tightness, shortness of breath, wheezing and stridor.   Cardiovascular: Negative.   Gastrointestinal: Negative.   Endocrine: Negative.   Genitourinary: Negative.   Musculoskeletal: Positive for myalgias, back pain and gait problem.  Skin: Negative.   Allergic/Immunologic: Negative.   Neurological: Positive for weakness. Negative  for dizziness, seizures, syncope, facial asymmetry, speech difficulty, light-headedness, numbness and headaches.  Hematological: Negative.   Psychiatric/Behavioral: Negative.   Current outpatient prescriptions:acetaminophen (TYLENOL) 500 MG tablet, Take 1,000 mg by mouth 2 (two) times daily.  , Disp: , Rfl: ;  albuterol (PROVENTIL HFA;VENTOLIN HFA) 108 (90 BASE) MCG/ACT inhaler, Inhale 2 puffs into the lungs every 6 (six) hours as needed. For shortness of breath , Disp: , Rfl: ;  ALPRAZolam (XANAX) 0.5 MG tablet, Take 0.5 mg by mouth at bedtime. ,  Disp: , Rfl:  beclomethasone (BECONASE-AQ) 42 MCG/SPRAY nasal spray, Place 2 sprays into the nose 2 (two) times daily. , Disp: , Rfl: ;  carvedilol (COREG) 6.25 MG tablet, Take 6.25 mg by mouth 2 (two) times daily with a meal., Disp: , Rfl: ;  cetirizine (ZYRTEC) 10 MG tablet, Take 10 mg by mouth at bedtime. , Disp: , Rfl: ;  Cholecalciferol (VITAMIN D) 2000 UNITS tablet, Take 2,000 Units by mouth every morning.  , Disp: , Rfl:  Cyanocobalamin 2500 MCG TABS, Take 1 tablet by mouth every morning. , Disp: , Rfl: ;  cyclobenzaprine (FLEXERIL) 10 MG tablet, Take 1 tablet (10 mg total) by mouth 3 (three) times daily as needed for muscle spasms., Disp: 45 tablet, Rfl: 0;  diphenhydrAMINE (BENADRYL) 25 MG tablet, Take 25 mg by mouth at bedtime., Disp: , Rfl: ;  gabapentin (NEURONTIN) 300 MG capsule, Take 300 mg by mouth 2 (two) times daily., Disp: , Rfl:  ibuprofen (ADVIL,MOTRIN) 200 MG tablet, Take 400 mg by mouth 2 (two) times daily as needed. For pain, Disp: , Rfl: ;  ipratropium-albuterol (DUONEB) 0.5-2.5 (3) MG/3ML SOLN, Take 3 mLs by nebulization 4 (four) times daily as needed. For shortness of breath, Disp: , Rfl: ;  levofloxacin (LEVAQUIN) 500 MG tablet, Take 1 tablet (500 mg total) by mouth daily., Disp: 7 tablet, Rfl: 0 levothyroxine (SYNTHROID, LEVOTHROID) 100 MCG tablet, Take 100 mcg by mouth every morning. , Disp: , Rfl: ;  meclizine (ANTIVERT) 25 MG tablet,  Take 50 mg by mouth 4 (four) times daily. For dizziness, Disp: , Rfl: ;  methocarbamol (ROBAXIN) 750 MG tablet, Take 750 mg by mouth at bedtime. , Disp: , Rfl: ;  mometasone-formoterol (DULERA) 100-5 MCG/ACT AERO, Inhale 2 puffs into the lungs 2 (two) times daily., Disp: , Rfl:  nitrofurantoin, macrocrystal-monohydrate, (MACROBID) 100 MG capsule, Take 100 mg by mouth 2 (two) times daily., Disp: , Rfl: ;  olmesartan-hydrochlorothiazide (BENICAR HCT) 40-12.5 MG per tablet, Take 0.5 tablets by mouth every morning. , Disp: , Rfl: ;  Omega-3 Fatty Acids (FISH OIL) 1000 MG CAPS, Take 1 capsule by mouth every morning. , Disp: , Rfl:  oseltamivir (TAMIFLU) 75 MG capsule, Take 1 capsule (75 mg total) by mouth 2 (two) times daily., Disp: 10 capsule, Rfl: 0;  oxyCODONE-acetaminophen (PERCOCET) 7.5-325 MG per tablet, Take 1 tablet by mouth daily as needed. For pain, Disp: , Rfl: ;  oxyCODONE-acetaminophen (ROXICET) 5-325 MG per tablet, Take 1 tablet by mouth every 6 (six) hours as needed for pain., Disp: 80 tablet, Rfl: 0 pantoprazole (PROTONIX) 40 MG tablet, Take 40 mg by mouth 2 (two) times daily. With breakfast and supper, Disp: , Rfl: ;  potassium chloride (K-DUR,KLOR-CON) 10 MEQ tablet, Take 10 mEq by mouth every morning. , Disp: , Rfl: ;  pravastatin (PRAVACHOL) 20 MG tablet, Take 20 mg by mouth at bedtime. , Disp: , Rfl: ;  promethazine (PHENERGAN) 25 MG tablet, Take 25 mg by mouth every 6 (six) hours as needed. For nausea, Disp: , Rfl:  ranitidine (ZANTAC) 150 MG tablet, Take 150 mg by mouth at bedtime., Disp: , Rfl: ;  Red Yeast Rice 600 MG CAPS, Take 1 capsule by mouth every morning.  , Disp: , Rfl: ;  SINGULAIR 10 MG tablet, Take 10 mg by mouth at bedtime. , Disp: , Rfl: ;  vitamin E 1000 UNIT capsule, Take 1,000 Units by mouth every morning.  , Disp: , Rfl:       Objective:  Physical Exam   Physical Exam  Vitals reviewed. Constitutional: She is oriented to person, place, and time. She appears  well-developed and well-nourished. No distress.       Sitting in wheel chair looks well and baseline  HENT:  Head: Normocephalic and atraumatic.  Right Ear: External ear normal.  Left Ear: External ear normal.  Mouth/Throat: Oropharynx is clear and moist. No oropharyngeal exudate.  Eyes: Conjunctivae normal and EOM are normal. Pupils are equal, round, and reactive to light. Right eye exhibits no discharge. Left eye exhibits no discharge. No scleral icterus.  Neck: Normal range of motion. Neck supple. No JVD present. No tracheal deviation present. No thyromegaly present.  Cardiovascular: Normal rate, regular rhythm, normal heart sounds and intact distal pulses.  Exam reveals no gallop and no friction rub.   No murmur heard. Pulmonary/Chest: Effort normal and breath sounds normal. No respiratory distress. She has no wheezes. She has no rales. She exhibits no tenderness.       Abdominal: Soft. Bowel sounds are normal. She exhibits no distension and no mass. There is no tenderness. There is no rebound and no guarding.  Musculoskeletal: Normal range of motion. She exhibits no edema and no tenderness.  Lymphadenopathy:    She has no cervical adenopathy.  Neurological: She is alert and oriented to person, place, and time. She has normal reflexes. No cranial nerve deficit. She exhibits normal muscle tone. Coordination normal.  Skin: Skin is warm and dry. No rash noted. She is not diaphoretic. No erythema. No pallor.  Psychiatric: She has a normal mood and affect. Her behavior is normal. Judgment and thought content normal.        Assessment & Plan:

## 2012-03-18 NOTE — Assessment & Plan Note (Signed)
She had right pleuritic chest pain 3 days into treatment for left lower lobe pneumonia early February 2014. This lasted 3 days but has since resolved.  Plan First check d-dimer and if elevated will do a VQ scan or CT angiogram of the chest to look for pulmonary embolism

## 2012-03-18 NOTE — Patient Instructions (Addendum)
#  Cough  - continue all measures as before - glad you saw Mr Krystal Powell - instead of coreg, talk to [facility], MD and change to generic Bisoprolol   #Pneumonia - left side  - clinically resolved.   - HAve CXR to ensure radiologic clearance  #Right pleurisy  - have d-dimer blood test, if elevated will order scan test for blood clot

## 2012-03-18 NOTE — Assessment & Plan Note (Signed)
Clinically resolved  Plan 2 chest x-ray two-view to ensure resolution

## 2012-03-19 ENCOUNTER — Telehealth: Payer: Self-pay | Admitting: Internal Medicine

## 2012-03-19 DIAGNOSIS — R0781 Pleurodynia: Secondary | ICD-10-CM

## 2012-03-19 NOTE — Telephone Encounter (Signed)
Pt aware. She stated Almyra Free has already called her and VQ scan is set up for Mon 03/22/12 at 10:15 over at [facility]. Will forward to MR so he is aware.

## 2012-03-19 NOTE — Telephone Encounter (Signed)
Ok thanks 

## 2012-03-19 NOTE — Telephone Encounter (Signed)
D dimer continuies to be high even after pna Rx and has had new rt pleuritic pain. 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 So order VQ scan please to be done today hopefully  Dr. Kalman Shan, M.D., [facility].C.P Pulmonary and Critical Care Medicine Staff Physician [facility] [place] Pulmonary and Critical Care Pager: [phone], If no answer or between  15:00h - 7:00h: call [phone]  03/19/2012 8:53 AM

## 2012-03-22 ENCOUNTER — Encounter (HOSPITAL_COMMUNITY)
Admission: RE | Admit: 2012-03-22 | Discharge: 2012-03-22 | Disposition: A | Discharge status: Home / Self Care | Payer: Medicare Other | Source: Ambulatory Visit | Attending: Internal Medicine | Admitting: Internal Medicine

## 2012-03-22 ENCOUNTER — Ambulatory Visit (HOSPITAL_COMMUNITY)
Admission: RE | Admit: 2012-03-22 | Discharge: 2012-03-22 | Disposition: A | Discharge status: Home / Self Care | Payer: Medicare Other | Source: Ambulatory Visit | Attending: Internal Medicine | Admitting: Internal Medicine

## 2012-03-22 DIAGNOSIS — R062 Wheezing: Secondary | ICD-10-CM | POA: Insufficient documentation

## 2012-03-22 DIAGNOSIS — R0781 Pleurodynia: Secondary | ICD-10-CM

## 2012-03-22 DIAGNOSIS — J90 Pleural effusion, not elsewhere classified: Secondary | ICD-10-CM | POA: Insufficient documentation

## 2012-03-22 DIAGNOSIS — R0602 Shortness of breath: Secondary | ICD-10-CM | POA: Insufficient documentation

## 2012-03-22 DIAGNOSIS — R071 Chest pain on breathing: Secondary | ICD-10-CM | POA: Insufficient documentation

## 2012-03-22 MED ORDER — TECHNETIUM TC 99M DIETHYLENETRIAME-PENTAACETIC ACID
41.2000 | Freq: Once | INTRAVENOUS | Status: AC | PRN
  Administered 2012-03-22: 41.2 via INTRAVENOUS

## 2012-03-22 MED ORDER — TECHNETIUM TO 99M ALBUMIN AGGREGATED
5.0000 | Freq: Once | INTRAVENOUS | Status: AC | PRN
  Administered 2012-03-22: 5 via INTRAVENOUS

## 2012-05-31 ENCOUNTER — Ambulatory Visit (HOSPITAL_COMMUNITY)
Admission: RE | Admit: 2012-05-31 | Discharge: 2012-05-31 | Disposition: A | Discharge status: Home / Self Care | Payer: Medicare Other | Source: Ambulatory Visit | Attending: Internal Medicine | Admitting: Internal Medicine

## 2012-05-31 DIAGNOSIS — J387 Other diseases of larynx: Secondary | ICD-10-CM

## 2012-05-31 DIAGNOSIS — Z09 Encounter for follow-up examination after completed treatment for conditions other than malignant neoplasm: Secondary | ICD-10-CM | POA: Insufficient documentation

## 2012-05-31 DIAGNOSIS — R0602 Shortness of breath: Secondary | ICD-10-CM | POA: Insufficient documentation

## 2012-05-31 DIAGNOSIS — R918 Other nonspecific abnormal finding of lung field: Secondary | ICD-10-CM | POA: Insufficient documentation

## 2012-05-31 DIAGNOSIS — J4489 Other specified chronic obstructive pulmonary disease: Secondary | ICD-10-CM | POA: Insufficient documentation

## 2012-05-31 DIAGNOSIS — R05 Cough: Secondary | ICD-10-CM

## 2012-05-31 DIAGNOSIS — J449 Chronic obstructive pulmonary disease, unspecified: Secondary | ICD-10-CM | POA: Insufficient documentation

## 2012-06-04 ENCOUNTER — Telehealth: Payer: Self-pay | Admitting: Internal Medicine

## 2012-06-04 NOTE — Telephone Encounter (Signed)
Ct chest normal. Give fu in 2 months to discuss weaning off gabapenting currntly being given for cough  Dr. Kalman Shan, M.D., [facility].C.P Pulmonary and Critical Care Medicine Staff Physician [facility] [place] Pulmonary and Critical Care Pager: [phone], If no answer or between  15:00h - 7:00h: call [phone]  06/04/2012 6:54 AM

## 2012-06-15 NOTE — Telephone Encounter (Signed)
Pt aware and appt scheduled. Carron Curie, CMA

## 2012-08-16 ENCOUNTER — Encounter: Payer: Self-pay | Admitting: Internal Medicine

## 2012-08-16 ENCOUNTER — Ambulatory Visit (INDEPENDENT_AMBULATORY_CARE_PROVIDER_SITE_OTHER): Payer: Medicare Other | Admitting: Internal Medicine

## 2012-08-16 VITALS — BP 130/80 | HR 83 | Temp 98.2°F | Ht 62.0 in | Wt 183.0 lb

## 2012-08-16 DIAGNOSIS — R05 Cough: Secondary | ICD-10-CM

## 2012-08-16 NOTE — Progress Notes (Signed)
Subjective:    Patient ID: Krystal Powell, female    DOB: 1945-10-26, 67 y.o.   MRN: [id number]  HPI 67 yo female seen for initial pulmonary consult 05/16/11 for chronic cough and recurrent lung infection x 20 months. PCP is HALL,ZACK, MD   05/16/11 Consult for chronic cough Reports recurrent lung infection x 20 months. Reports episodes of fever, green mucus, cough, wheezing, weakness. Rx with antibiotics and prednsione that helps temporarily but recurs after a few days of completing course. Even between episodes does not feel fully fine; still has some cough. Only 1 admission for this; 05/01/11 - 05/04/11 and CXR showed LLL consolidation. Never smoked though parents smoked as child. Gives hx of GERD and gives hx of esophageal dilatation 5-6 times (Dr Kendell [facility] in [place]) and apparently refractory. Still with dysphagia - needs to take liquid to swallow food and has difficulty with large pills (cuts antibiotics into 4 pieces). She herself suspects she is aspirating food into lungs (gives hx of several episodes of coughing up food especially in last 20 months).   Overall main issue appears to be chronic daily cough. RSI cough score is 38 out of 40 and very c/w LPR cough. Describes Level 5 - post nasal drip, difficulty swallowing, breathing difficulty/choking episodies, annoying cough, sensation of something in throat, and hearburn. Level 4 - hoarseness of voice or cough after lying down  Recollects hx of PFT [facility] 2012 in [place] that reportedly showed copd. 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 Lived in [place] for 15 years for asthma. REcently saw DR Willa Rough allergist who she says was nervous to do allergy tests due to pulmonary hx.. Hx of sinus surger in [place] over 20 years ago. Saw DR Suszanne Conners in [facility] last year. Reports polyps and constant sinus drainage.  REcollects vocal cord nodule ablation by Dr Deland [facility] [place]  Hx  of allergy testing in approx 2011 by Dr Linus Orn Willa Rough - positive sking test. Was told "breathing too poor to give allergy shots". Says around that time went on O2 Rx due to a "blown up lung situation" but this resolved in a year  06/04/2011 Acute OV c/o chillis, fever, congestion, cough with green mucus, runny nose, and pain in her lungs for last 4 days.  Lots of nasal drainage. Increased wheezing. OTC not helping. Feels bad with no energy.  Has CT chest planned next week. No hemoptysis, chest pain , no edema. Last ABX 1 month ago.   REC Hold fish Oil for 1 month then place in freezer  Omnicef 300 mg twice daily for 7 days, take with food.  Mucinex DM twice daily as needed for cough and congestion.  Saline nasal rinses as needed.  Fluids and rest.  Prednisone taper for the next week.  Followup as planned for CT scan next week.  follow up Dr. Marchelle Gearing in 2 weeks as planned  Please contact office for sooner follow up if symptoms do not improve or worsen or seek emergency care    OV 06/17/2011 [place] chronic cough   Cough persists unchanged and is severe and associated with mild on and off green sputum.  She did see NP earlier this month for acute bronchitis episode with wheizng and given omnicef and prednisone burst and was asked to stopp fish oil. Current RSI cough score is 37 and unchanged and c/w LPR cough.  Kouffman cough differentiator -  she scored 5 for GE reflux and 4 for neurogenic cough suggesting  bouth strong ge reflux and neurogenic components for cough. CT chest 06/06/11 - esssentially clear to my review exceot for scattered nodules. She has see Dr Rhea Belton of GI and per her hx had MBSS and esophageal manometry and both reportedly normal. FU and discussion about nissen fundoplication pending.   REviewed meds and noted she is on, flonase, zyrtec, benadryl, duoneb, dulera, singulair, protonix and znatac. Of note, she is on non specific beta blocker coreg through there is no hx of  chf     CT CHEST 06/06/11 Comparison: None  Correlation: Chest radiograph 05/01/2011  Findings:  Gallbladder surgically absent.  Remaining visualized portions of upper abdomen normal appearance.  Minimal atherosclerotic calcification.  Asymmetric density upper left breast 1.4 x 1.5 cm image 2.  Surgical clips left axilla.  No definite thoracic adenopathy.  Tracheobronchial cartilage calcification noted.  Atelectasis versus scarring in medial right upper lobe images 22-  27.  Additional atelectasis versus scarring in medial left lower lobe,  somewhat more focally dense.  Numerous tiny nodular foci noted throughout both lungs, some poorly  defined, measuring up to 5 mm diameter.  These are nonspecific in appearance of uncertain etiology; none are  definitively calcified to suggest old granulomatous disease.  No infiltrate, pleural effusion or pneumothorax.  Scattered end plate spur formation thoracic spine.  IMPRESSION:  Scattered areas of atelectasis versus scarring medial right upper  lobe and medial left lower lobe, slightly more prominent in left  lower lobe.  Numerous tiny noncalcified nodular foci throughout both lungs up to  5 mm greatest size, nonspecific.  If the patient is at high risk for bronchogenic carcinoma, follow-  up chest CT at 6-12 months is recommended. If the patient is at  low risk for bronchogenic carcinoma, follow-up chest CT at 12  months is recommended. This recommendation follows the consensus  statement: Guidelines for Management of Small Pulmonary Nodules  Detected on CT Scans: A Statement from the Fleischner Society as  published in Radiology 2005; 237:395-400.  0.  Original Report Authenticated By: Lollie Marrow, M.D.     [facility] It appears cough is due to acid reflux, neurogenic cough and maybe asthma but we need to rule out asthma component because the inhalers themselves could make cough worse  Hold dulera for 2 weeks but if you get worse call  us immediately  Then have methacholine challenge test  Call for me to review those results  Further plan based on those results  Update 10:33 PM on 06/17/2011  - will cal PMD annd have him dc coreg in case there is asthma and this non specific beta blocker is making it worse  OV 06/25/2011 - ACUTE VISIT Cough worse after stopping dulera while waiting for methacholine challenge. Worsening started 4 days after coming off dulera. Reports worsening cough that is dry, dyspnea, wheeze but no sputum. She saw GI yesterday and they are recommending current line of GERD mgmt with option of nissen fundoplication in future. Today RSI cough score is 34 (note she reports cough is worse but score is better/similar to last ov) and on cough differentiator she scored 4 out of 5 for GERD and 5 out of 5 for neurogenic cough. Spirometry today sows moderate obstuction  -fev1 1.38L/64%, Ratio 69. FVC 1.99L/73%, small airway 43%  Of note, she is now off coreg and off fish oil >>restart Dulera   12/29/2011 Acute OV  Complains of prod  cough with green mucus, hoarseness, rattling in chest, increased SOB, chills/sweats x6 days.  Finished a pred pak yesterday that was left over.  Did not help. Still coughing up blood.  No chest pain, edema or fever.  otc cough meds not working.    Omnicef 300 mg twice daily for 10 days, take with food.  Mucinex DM twice daily as needed for cough and congestion.  Saline nasal rinses as needed.  Fluids and rest.  Prednisone taper for the next week.  Follow up Dr. Marchelle Gearing 6 weeks and As needed  Please contact office for sooner follow up if symptoms do not improve or worsen or seek emergency care    OV 02/09/2012  Overall cough better but still with significnat cough that is burdensome. Says she has a lot of kleenex due to cough. Cough is associtaed with green sputum and significant sinus drainage. For sinus: she uses nasal steroid, saline spray. Unable to do netti pot due to flow into  ear. For allergies: sees Dr Al Decant. Says she is not eligible for allergy shots due to "lung issue". Apparently Dr Willa Rough told her, " you do not breathe welll enough for you to get allergy shots"/. For sinus: also sees Dr Kennedy Bucker who apparentlyu has told her "you have congestion in your sinus". She gives hx feb 2013 of polyp removal from larynx at [facility]. For lungs: uses dulera. For GERD: protonix bid and zantac qhs. Back on fish oil due to resurgence of cholesteroil. Going back has not made cough worse.  In terms of BP: she is back on coreg because amlodipone made her tachy. RSI cough score improved from 37 to 27 but still because greater than 15 reflects LPR cough.      #Cough  - your cough will not go away as long as the irritants are there but we can work towards getting rid of it  - #for sinus  - do all the things you are doing but add neil med hypertonic saline spray 2 times each nostril at night  - will hold off antibiotics due to multiple allergies, chronic nature and lack of toxicity-  #acid reflux  -continue as before  #asthma  - continue dulera  #BP  - need to get off coreg; talk to PMD. THere is no way with asthma you can be on this medication  #Irritable larynx  - Take gabapentin 300mg  once daily x 3 days, then 300mg  twice daily x 3 days, then 300mg  three times daily to continue. If this makes you too sleepy or drowsy call us and we will cut your medication dosing down  - refer Mr Sundra Aland speech therapy  - at times you have urge to cough, drink water or chew on sugarless lozenge  #Acid REflux  - per [facility]  #Lung nodule  - CT chest may 2014 followu[  #REturn  - 6 weeks to report progress  - cough score at followup   OV 02/26/2012 acute visit for patient  Krystal Powell  After her last visit in starting Neurontin her cough has resolved as indicated by her RSI score of 3 compared to 27 in the past. However on February 24 2012 2 days ago, she developed acute onset fever with  chills at night. This was associated with feeling of exhaustion and chills and right worse. The next morning which was yesterday she developed acute onset moderate pleuritic pain of the chest on the left infra-axillary area. As stated before there is no increase  in cough in fact cough is better but she is having some green sputum that is more than baseline. She denies any associated hemoptysis, edema. She generally feels worn out. There is no nausea or vomiting. Of note, she has had a flu shot this season. Is no increased shortness of breath or wheezing     LABS Urine streptococcal antigen negative April 2013  D-dimer slightly high at 3  Lab 02/26/12 1215  NA 142  K 4.1  CL 103  CO2 30  GLUCOSE 127*  BUN 19  CREATININE 1.1  CALCIUM 9.6  MG --  PHOS --    Lab 02/26/12 1215  HGB 13.7  HCT 40.6  WBC 11.2*  PLT 247.0   Dg Chest 2 View  02/26/2012  *RADIOLOGY REPORT*  Clinical Data: Fever.  Pleuritic chest pain.  CHEST - 2 VIEW  Comparison: Two-view chest 01/15/2012.  Findings: Cardiac enlargement is stable.  Emphysematous changes are again noted.  A left pleural effusion and basilar airspace disease is noted.  Nodular densities are seen in the posterior mediastinum on the lateral view.  No definite disease is evident on the PA view. However, this is in the region of previous nodular disease on the CT scan.  The visualized soft tissues and bony thorax are unremarkable.  IMPRESSION:  1.  New left pleural effusion and basilar airspace disease.  While this may represent atelectasis, infection is not excluded. 2.  Nodular densities on the lateral view.  This could represent progression of pulmonary nodules.  Recommend CT of the chest without contrast for further evaluation.   Original Report Authenticated By: Marin Roberts, M.D.     [facility] You likely have a pneumonia  Not sure if you have influenza despite getting flu shot  Give blood for blood test and do a chest x-ray; if these are  normal will have her call you and do a CT scan or another test of the chest to look for blood clot in the lung  You absolutely need antibiotics despite multiple allergies to multiple antibiotics  - take levaquin 500mg  once daily X 7 days (extended duration based on your request]  - I do note that you have allergy to Avelox in terms of nausea and vomiting but because if taken ciprofloxacin in the past without any problems we're going to go ahead with Levaquin  Hold nitrofurantoin while taking Levaquin  Also take Tamiflu 75 mg twice a day for 5 days just in case you have influenza; please note this can make him nauseated and if so please call us and we can stop it  Return to prior visit unless otherwise stated  OV 03/18/2012  Followup for chronic cough and also recent pneumonia end January 2014  - Chronic cough is almost resolved. She says gabapentin was the best drug for  Cough. Currently RSI score is 5 and a similar to last visit. She still has some mild amount of green sputum which she says is similar to baseline. She thinks this is from postnasal drip. She says is compliant with sinus, acid reflux, asthma and irritable larynx treatment. She's completed speech therapy and is being discharged  -. In terms of pneumonia last month: The antibiotic Levaquin work well without any side effects. Also acute symptomatology of fever chills left pleuritic chest pain have all resolved. However 3 days into treatment for pneumonia she developed acute pleuritic chest pain on the contralateral side which is the right side. Apparently this pain was moderate in intensity  and lasted for 3 days and resolve spontaneously. There is no associated edema or hemoptysis at this time. Of note, at the time of diagnosis of left lower lobe pneumonia with pleural effusion her d-dimer was slightly high at 3 and but we deferred pulmonary embolism workup  REC #Cough  - continue all measures as before  - glad you saw Mr Sundra Aland  -  instead of coreg, talk to [facility], MD and change to generic Bisoprolol  #Pneumonia - left side  - clinically resolved.  - HAve CXR to ensure radiologic clearance  #Right pleurisy  - have d-dimer blood test, if elevated will order scan test for blood clot   OV 08/16/2012  Followup for chronic cough. Overall cough is improved although the cough score is slightly worse. 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 She feels this is due to allergies. She is open to seeing an allergist. Details of cough presented in the table below   Dr Gretta Cool Reflux Symptom Index (> 13-15 suggestive of LPR cough) May 2013 02/09/2012  02/26/2012 LL pna, acutely ill 03/18/2012  08/16/2012   Hoarseness of problem with voice 5 3 1 1 4   Clearing  Of Throat 5 4 1 1 4   Excess throat mucus or feeling of post nasal drip 3 4 1 2 4   Difficulty swallowing food, liquid or tablets 3 3 0 0 0  Cough after eating or lying down 5 3 0 0 0  Breathing difficulties or choking episodes 3 3 0 0 0  Troublesome or annoying cough 5 3 0 0 0  Sensation of something sticking in throat or lump in throat 4 3 0 0 0  Heartburn, chest pain, indigestion, or stomach acid coming up 4 1 0 1 1  TOTAL 37 27 3 5 13   intrevention  Will Rx with neurontin        Kouffman Reflux v Neurogenic Cough Differentiator May 2013 02/09/2012   03/18/2012  08/16/2012   Do you awaken from a sound sleep coughing violently?                            With trouble breathing? Yes n n n  Do you have choking episodes when you cannot  Get enough air, gasping for air ?              Yes n n N, yes if I am sock  Do you usually cough when you lie down into  The bed, or when you just lie down to rest ?                          Yes y sometimes n  Do you usually cough after meals or eating?         Yes sometime n n   4 1.5 0.5 0  Do you cough when (or after) you bend over?    Yes n n n  Do you more-or-less cough all day long? yes n no n   Does change of temperature make you cough? x y sometimes n  Does laughing or chuckling cause you to cough? yes YEs sometimes n  Do fumes (perfume, automobile fumes, burned  Toast, etc.,) cause you to cough ?      yes Yes sometimes No but will wheeze  Does speaking, singing, or talking on the phone cause you to cough   ?  yes Yes Sometimes sometimes  Total 4 3 2 1     Review of Systems  Constitutional: Negative for fever and unexpected weight change.  HENT: Positive for postnasal drip. Negative for ear pain, nosebleeds, congestion, sore throat, rhinorrhea, sneezing, trouble swallowing, dental problem and sinus pressure.   Eyes: Negative for redness and itching.  Respiratory: Positive for cough. Negative for chest tightness, shortness of breath and wheezing.   Cardiovascular: Negative for palpitations and leg swelling.  Gastrointestinal: Negative for nausea and vomiting.  Genitourinary: Negative for dysuria.  Musculoskeletal: Negative for joint swelling.  Skin: Negative for rash.  Neurological: Negative for headaches.  Hematological: Does not bruise/bleed easily.  Psychiatric/Behavioral: Negative for dysphoric mood. The patient is not nervous/anxious.    Current outpatient prescriptions:acetaminophen (TYLENOL) 500 MG tablet, Take 1,000 mg by mouth 2 (two) times daily.  , Disp: , Rfl: ;  albuterol (PROVENTIL HFA;VENTOLIN HFA) 108 (90 BASE) MCG/ACT inhaler, Inhale 2 puffs into the lungs every 6 (six) hours as needed. For shortness of breath , Disp: , Rfl: ;  ALPRAZolam (XANAX) 0.5 MG tablet, Take 0.5 mg by mouth at bedtime. , Disp: , Rfl:  beclomethasone (BECONASE-AQ) 42 MCG/SPRAY nasal spray, Place 2 sprays into the nose 2 (two) times daily. , Disp: , Rfl: ;  celecoxib (CELEBREX) 200 MG capsule, Take 200 mg by mouth daily., Disp: , Rfl: ;  cetirizine (ZYRTEC) 10 MG tablet, Take 10 mg by mouth at bedtime. , Disp: , Rfl: ;  Cholecalciferol (VITAMIN D) 2000 UNITS tablet, Take 2,000  Units by mouth every morning.  , Disp: , Rfl:  Cyanocobalamin 2500 MCG TABS, Take 1 tablet by mouth every morning. , Disp: , Rfl: ;  cyclobenzaprine (FLEXERIL) 10 MG tablet, Take 10 mg by mouth 3 (three) times daily as needed for muscle spasms., Disp: , Rfl: ;  diltiazem (CARDIZEM) 120 MG tablet, Take 120 mg by mouth daily., Disp: , Rfl: ;  diphenhydrAMINE (BENADRYL) 25 MG tablet, Take 25 mg by mouth at bedtime., Disp: , Rfl:  gabapentin (NEURONTIN) 300 MG capsule, Take 300 mg by mouth 2 (two) times daily., Disp: , Rfl: ;  ipratropium-albuterol (DUONEB) 0.5-2.5 (3) MG/3ML SOLN, Take 3 mLs by nebulization 4 (four) times daily as needed. For shortness of breath, Disp: , Rfl: ;  levothyroxine (SYNTHROID, LEVOTHROID) 100 MCG tablet, Take 100 mcg by mouth every morning. , Disp: , Rfl:  meclizine (ANTIVERT) 25 MG tablet, Take 50 mg by mouth 4 (four) times daily. For dizziness, Disp: , Rfl: ;  mometasone-formoterol (DULERA) 100-5 MCG/ACT AERO, Inhale 2 puffs into the lungs 2 (two) times daily., Disp: , Rfl: ;  olmesartan-hydrochlorothiazide (BENICAR HCT) 40-12.5 MG per tablet, Take 0.5 tablets by mouth every morning. , Disp: , Rfl: ;  Omega-3 Fatty Acids (FISH OIL) 1000 MG CAPS, Take 1 capsule by mouth every morning. , Disp: , Rfl:  oxyCODONE-acetaminophen (ROXICET) 5-325 MG per tablet, Take 1 tablet by mouth every 6 (six) hours as needed for pain., Disp: 80 tablet, Rfl: 0;  pantoprazole (PROTONIX) 40 MG tablet, Take 40 mg by mouth 2 (two) times daily. With breakfast and supper, Disp: , Rfl: ;  potassium chloride (K-DUR,KLOR-CON) 10 MEQ tablet, Take 10 mEq by mouth every morning. , Disp: , Rfl:  pravastatin (PRAVACHOL) 20 MG tablet, Take 20 mg by mouth at bedtime. , Disp: , Rfl: ;  promethazine (PHENERGAN) 25 MG tablet, Take 25 mg by mouth every 6 (six) hours as needed. For nausea, Disp: , Rfl: ;  ranitidine (ZANTAC)  150 MG tablet, Take 150 mg by mouth at bedtime., Disp: , Rfl: ;  Red Yeast Rice 600 MG CAPS, Take 1  capsule by mouth every morning.  , Disp: , Rfl: ;  SINGULAIR 10 MG tablet, Take 10 mg by mouth at bedtime. , Disp: , Rfl:  trimethoprim (TRIMPEX) 100 MG tablet, Take 100 mg by mouth at bedtime., Disp: , Rfl: ;  vitamin E 1000 UNIT capsule, Take 1,000 Units by mouth every morning.  , Disp: , Rfl:     Past, Family, Social reviewed: no change since last visit  Objective:   Physical Exam Vitals reviewed. Constitutional: She is oriented to person, place, and time. She appears well-developed and well-nourished. No distress.       Sitting in wheel chair looks well and baseline  HENT:  Head: Normocephalic and atraumatic.  Right Ear: External ear normal.  Left Ear: External ear normal.  Mouth/Throat: Oropharynx is clear and moist. No oropharyngeal exudate.  Eyes: Conjunctivae normal and EOM are normal. Pupils are equal, round, and reactive to light. Right eye exhibits no discharge. Left eye exhibits no discharge. No scleral icterus.  Neck: Normal range of motion. Neck supple. No JVD present. No tracheal deviation present. No thyromegaly present.  Cardiovascular: Normal rate, regular rhythm, normal heart sounds and intact distal pulses.  Exam reveals no gallop and no friction rub.   No murmur heard. Pulmonary/Chest: Effort normal and breath sounds normal. No respiratory distress. She has no wheezes. She has no rales. She exhibits no tenderness.       Abdominal: Soft. Bowel sounds are normal. She exhibits no distension and no mass. There is no tenderness. There is no rebound and no guarding.  Musculoskeletal: Normal range of motion. She exhibits no edema and no tenderness.  Lymphadenopathy:    She has no cervical adenopathy.  Neurological: She is alert and oriented to person, place, and time. She has normal reflexes. No cranial nerve deficit. She exhibits normal muscle tone. Coordination normal.  Skin: Skin is warm and dry. No rash noted. She is not diaphoretic. No erythema. No pallor.  Psychiatric:  She has a normal mood and affect. Her behavior is normal. Judgment and thought content normal.         Assessment & Plan:

## 2012-08-16 NOTE — Patient Instructions (Addendum)
#  Cough  - continue all measures as before including neurontin - to evaluate allergy causes of severe chronic rhinosinusitis see Dr Maple Hudson in our office  #Followup   - Dr Maple Hudson allergist  - return to see me in 4 months or sooner

## 2012-08-26 NOTE — Assessment & Plan Note (Signed)
#  Cough  - continue all measures as before including neurontin - to evaluate allergy causes of severe chronic rhinosinusitis see Dr Young in our office  #Followup   - Dr Young allergist  - return to see me in 4 months or sooner 

## 2012-09-20 ENCOUNTER — Telehealth: Payer: Self-pay | Admitting: Internal Medicine

## 2012-09-20 DIAGNOSIS — R05 Cough: Secondary | ICD-10-CM

## 2012-09-20 NOTE — Telephone Encounter (Signed)
I spoke with the pt and she states that she tried to get an appt here with CY but he was too booked up and [place] [facility] is much closer to her home so she wanted to be referred there. She states whoever she spoke with at her last OV was supposed to do this. I do not recall this or see any mention of this but pt insists. She has already called  and scheduled an appt but they are needing referral info. I placed an order for [facility] to send all needed information. Pt and Marisue Ivan at [employer] are aware. Carron Curie, CMA

## 2012-11-11 ENCOUNTER — Other Ambulatory Visit: Payer: Self-pay | Admitting: *Deleted

## 2012-11-11 MED ORDER — GABAPENTIN 300 MG PO CAPS: 300.0000 mg | ORAL_CAPSULE | Freq: Two times a day (BID) | ORAL | Status: AC

## 2012-11-16 IMAGING — RF DG SWALLOWING FUNCTION
1 series · 1 of 1 positions shown · non-contrast
Comparison: None

CLINICAL DATA: Reflux, recurrent pneumonia, Franz Georg disease

SWALLOWING FUNCTION STUDY:
TECHNIQUE: Patient swallowed varying consistencies of barium with
the assistance of a speech pathologist.  I performed real-time
fluoroscopic assessment in the lateral projection.
Fluoroscopy time:  1.8 minutes

[Series 1: run · 1 of 1 slices shown]
[im 1/1]
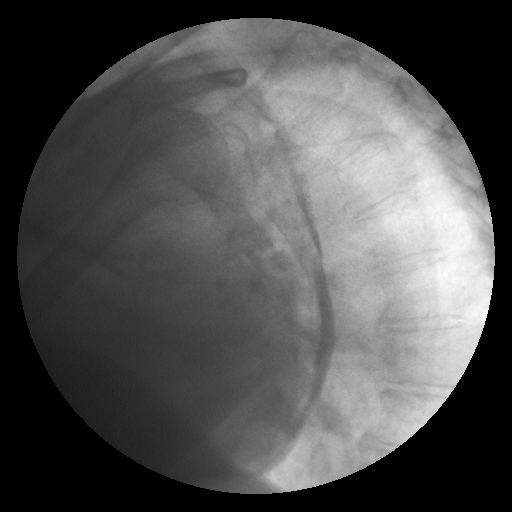

[1 of 1 positions shown; findings below may reference images not displayed]

FINDINGS: With applesauce consistency, normal motion is seen without
laryngeal penetration or aspiration.  No significant delay in
initiation or residuals identified.

With cracker consistency, normal initiation is seen without
laryngeal penetration or aspiration.  Mild vallecular residuals
noted.

The patient swallowed a 12.5 mm diameter barium tablet with thin
barium.  No delayed initiation.  No laryngeal penetration,
aspiration or residuals.

With thin barium by cup, normal motion is seen without delay,
laryngeal penetration or aspiration.  Minimal vallecular residuals
noted on a single swallow, potentially from oral residual.  Thin
barium by straw demonstrates normal motion without laryngeal
penetration or aspiration.  No residuals.
IMPRESSION: Minimal vallecular residuals as above.
Otherwise normal exam.

## 2012-11-26 ENCOUNTER — Ambulatory Visit (INDEPENDENT_AMBULATORY_CARE_PROVIDER_SITE_OTHER): Payer: Medicare Other | Admitting: Adult Health

## 2012-11-26 ENCOUNTER — Encounter: Payer: Self-pay | Admitting: Adult Health

## 2012-11-26 VITALS — BP 136/84 | HR 88 | Temp 97.6°F | Ht 62.0 in | Wt 198.0 lb

## 2012-11-26 DIAGNOSIS — J45909 Unspecified asthma, uncomplicated: Secondary | ICD-10-CM

## 2012-11-26 MED ORDER — PREDNISONE 10 MG PO TABS: ORAL_TABLET | ORAL | Status: DC

## 2012-11-26 MED ORDER — LEVOFLOXACIN 500 MG PO TABS: 500.0000 mg | ORAL_TABLET | Freq: Every day | ORAL | Status: AC

## 2012-11-26 NOTE — Patient Instructions (Addendum)
Levaquin 500mg daily for 7 days .  Mucinex DM twice daily as needed for cough and congestion. Saline nasal rinses as needed. Fluids and rest. Prednisone taper for the next week. Follow up Dr. Ramaswamy 6 weeks and As needed   Please contact office for sooner follow up if symptoms do not improve or worsen or seek emergency care  Take Align while on antibiotics     

## 2012-11-29 NOTE — Assessment & Plan Note (Signed)
Levaquin 500mg  daily for 7 days .  Mucinex DM twice daily as needed for cough and congestion. Saline nasal rinses as needed. Fluids and rest. Prednisone taper for the next week. Follow up Dr. Marchelle Gearing 6 weeks and As needed   Please contact office for sooner follow up if symptoms do not improve or worsen or seek emergency care  Take Align while on antibiotics

## 2012-11-29 NOTE — Progress Notes (Signed)
Subjective:    Patient ID: Krystal Powell, female    DOB: Oct 12, 1945, 67 y.o.   MRN: [id number]  HPI 67 yo female seen for initial pulmonary consult 05/16/11 for chronic cough and recurrent lung infection x 20 months. PCP is HALL,ZACK, MD   05/16/11 Consult for chronic cough Reports recurrent lung infection x 20 months. Reports episodes of fever, green mucus, cough, wheezing, weakness. Rx with antibiotics and prednsione that helps temporarily but recurs after a few days of completing course. Even between episodes does not feel fully fine; still has some cough. Only 1 admission for this; 05/01/11 - 05/04/11 and CXR showed LLL consolidation. Never smoked though parents smoked as child. Gives hx of GERD and gives hx of esophageal dilatation 5-6 times (Dr Kendell B[facility] in [place]) and apparently refractory. Still with dysphagia - needs to take liquid to swallow food and has difficulty with large pills (cuts antibiotics into 4 pieces). She herself suspects she is aspirating food into lungs (gives hx of several episodes of coughing up food especially in last 20 months).   Overall main issue appears to be chronic daily cough. RSI cough score is 38 out of 40 and very c/w LPR cough. Describes Level 5 - post nasal drip, difficulty swallowing, breathing difficulty/choking episodies, annoying cough, sensation of something in throat, and hearburn. Level 4 - hoarseness of voice or cough after lying down  Recollects hx of PFT [facility] 2012 [place] that reportedly showed copd. 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 Lived in [place] for 15 years for asthma. REcently saw DR Willa Rough allergist who she says was nervous to do allergy tests due to pulmonary hx.. Hx of sinus surger in [place] over 20 years ago. Saw DR Suszanne Conners in [place] [facility] last year. Reports polyps and constant sinus drainage.  REcollects vocal cord nodule ablation by Dr Deland [facility] [place]  Hx  of allergy testing in approx 2011 by Dr Linus Orn Willa Rough - positive sking test. Was told "breathing too poor to give allergy shots". Says around that time went on O2 Rx due to a "blown up lung situation" but this resolved in a year  06/04/2011 Acute OV c/o chillis, fever, congestion, cough with green mucus, runny nose, and pain in her lungs for last 4 days.  Lots of nasal drainage. Increased wheezing. OTC not helping. Feels bad with no energy.  Has CT chest planned next week. No hemoptysis, chest pain , no edema. Last ABX 1 month ago.   REC Hold fish Oil for 1 month then place in freezer  Omnicef 300 mg twice daily for 7 days, take with food.  Mucinex DM twice daily as needed for cough and congestion.  Saline nasal rinses as needed.  Fluids and rest.  Prednisone taper for the next week.  Followup as planned for CT scan next week.  follow up Dr. Marchelle Gearing in 2 weeks as planned  Please contact office for sooner follow up if symptoms do not improve or worsen or seek emergency care    OV 06/17/2011 [place] chronic cough   Cough persists unchanged and is severe and associated with mild on and off green sputum.  She did see NP earlier this month for acute bronchitis episode with wheizng and given omnicef and prednisone burst and was asked to stopp fish oil. Current RSI cough score is 37 and unchanged and c/w LPR cough.  Kouffman cough differentiator -  she scored 5 for GE reflux and 4 for neurogenic cough suggesting  bouth strong ge reflux and neurogenic components for cough. CT chest 06/06/11 - esssentially clear to my review exceot for scattered nodules. She has see Dr Rhea Belton of GI and per her hx had MBSS and esophageal manometry and both reportedly normal. FU and discussion about nissen fundoplication pending.   REviewed meds and noted she is on, flonase, zyrtec, benadryl, duoneb, dulera, singulair, protonix and znatac. Of note, she is on non specific beta blocker coreg through there is no hx of  chf     CT CHEST 06/06/11 Comparison: None  Correlation: Chest radiograph 05/01/2011  Findings:  Gallbladder surgically absent.  Remaining visualized portions of upper abdomen normal appearance.  Minimal atherosclerotic calcification.  Asymmetric density upper left breast 1.4 x 1.5 cm image 2.  Surgical clips left axilla.  No definite thoracic adenopathy.  Tracheobronchial cartilage calcification noted.  Atelectasis versus scarring in medial right upper lobe images 22-  27.  Additional atelectasis versus scarring in medial left lower lobe,  somewhat more focally dense.  Numerous tiny nodular foci noted throughout both lungs, some poorly  defined, measuring up to 5 mm diameter.  These are nonspecific in appearance of uncertain etiology; none are  definitively calcified to suggest old granulomatous disease.  No infiltrate, pleural effusion or pneumothorax.  Scattered end plate spur formation thoracic spine.  IMPRESSION:  Scattered areas of atelectasis versus scarring medial right upper  lobe and medial left lower lobe, slightly more prominent in left  lower lobe.  Numerous tiny noncalcified nodular foci throughout both lungs up to  5 mm greatest size, nonspecific.  If the patient is at high risk for bronchogenic carcinoma, follow-  up chest CT at 6-12 months is recommended. If the patient is at  low risk for bronchogenic carcinoma, follow-up chest CT at 12  months is recommended. This recommendation follows the consensus  statement: Guidelines for Management of Small Pulmonary Nodules  Detected on CT Scans: A Statement from the Fleischner Society as  published in Radiology 2005; 237:395-400.  0.  Original Report Authenticated By: Lollie Marrow, M.D.     [facility] It appears cough is due to acid reflux, neurogenic cough and maybe asthma but we need to rule out asthma component because the inhalers themselves could make cough worse  Hold dulera for 2 weeks but if you get worse call  us immediately  Then have methacholine challenge test  Call for me to review those results  Further plan based on those results  Update 10:33 PM on 06/17/2011  - will cal PMD annd have him dc coreg in case there is asthma and this non specific beta blocker is making it worse  OV 06/25/2011 - ACUTE VISIT Cough worse after stopping dulera while waiting for methacholine challenge. Worsening started 4 days after coming off dulera. Reports worsening cough that is dry, dyspnea, wheeze but no sputum. She saw GI yesterday and they are recommending current line of GERD mgmt with option of nissen fundoplication in future. Today RSI cough score is 34 (note she reports cough is worse but score is better/similar to last ov) and on cough differentiator she scored 4 out of 5 for GERD and 5 out of 5 for neurogenic cough. Spirometry today sows moderate obstuction  -fev1 1.38L/64%, Ratio 69. FVC 1.99L/73%, small airway 43%  Of note, she is now off coreg and off fish oil >>restart Dulera   12/29/2011 Acute OV  Complains of prod  cough with green mucus, hoarseness, rattling in chest, increased SOB, chills/sweats x6 days.  Finished a pred pak yesterday that was left over.  Did not help. Still coughing up blood.  No chest pain, edema or fever.  otc cough meds not working.    Omnicef 300 mg twice daily for 10 days, take with food.  Mucinex DM twice daily as needed for cough and congestion.  Saline nasal rinses as needed.  Fluids and rest.  Prednisone taper for the next week.  Follow up Dr. Marchelle Gearing 6 weeks and As needed  Please contact office for sooner follow up if symptoms do not improve or worsen or seek emergency care    OV 02/09/2012  Overall cough better but still with significnat cough that is burdensome. Says she has a lot of kleenex due to cough. Cough is associtaed with green sputum and significant sinus drainage. For sinus: she uses nasal steroid, saline spray. Unable to do netti pot due to flow into  ear. For allergies: sees Dr Al Decant. Says she is not eligible for allergy shots due to "lung issue". Apparently Dr Willa Rough told her, " you do not breathe welll enough for you to get allergy shots"/. For sinus: also sees Dr Kennedy Bucker who apparentlyu has told her "you have congestion in your sinus". She gives hx feb 2013 of polyp removal from larynx at [facility]. For lungs: uses dulera. For GERD: protonix bid and zantac qhs. Back on fish oil due to resurgence of cholesteroil. Going back has not made cough worse.  In terms of BP: she is back on coreg because amlodipone made her tachy. RSI cough score improved from 37 to 27 but still because greater than 15 reflects LPR cough.      #Cough  - your cough will not go away as long as the irritants are there but we can work towards getting rid of it  - #for sinus  - do all the things you are doing but add neil med hypertonic saline spray 2 times each nostril at night  - will hold off antibiotics due to multiple allergies, chronic nature and lack of toxicity-  #acid reflux  -continue as before  #asthma  - continue dulera  #BP  - need to get off coreg; talk to PMD. THere is no way with asthma you can be on this medication  #Irritable larynx  - Take gabapentin 300mg  once daily x 3 days, then 300mg  twice daily x 3 days, then 300mg  three times daily to continue. If this makes you too sleepy or drowsy call us and we will cut your medication dosing down  - refer Mr Sundra Aland speech therapy  - at times you have urge to cough, drink water or chew on sugarless lozenge  #Acid REflux  - per [facility]  #Lung nodule  - CT chest may 2014 followu[  #REturn  - 6 weeks to report progress  - cough score at followup   OV 02/26/2012 acute visit for patient  Krystal Powell  After her last visit in starting Neurontin her cough has resolved as indicated by her RSI score of 3 compared to 27 in the past. However on February 24 2012 2 days ago, she developed acute onset fever with  chills at night. This was associated with feeling of exhaustion and chills and right worse. The next morning which was yesterday she developed acute onset moderate pleuritic pain of the chest on the left infra-axillary area. As stated before there is no increase  in cough in fact cough is better but she is having some green sputum that is more than baseline. She denies any associated hemoptysis, edema. She generally feels worn out. There is no nausea or vomiting. Of note, she has had a flu shot this season. Is no increased shortness of breath or wheezing     LABS Urine streptococcal antigen negative April 2013  D-dimer slightly high at 3  Lab 02/26/12 1215  NA 142  K 4.1  CL 103  CO2 30  GLUCOSE 127*  BUN 19  CREATININE 1.1  CALCIUM 9.6  MG --  PHOS --    Lab 02/26/12 1215  HGB 13.7  HCT 40.6  WBC 11.2*  PLT 247.0   Dg Chest 2 View  02/26/2012  *RADIOLOGY REPORT*  Clinical Data: Fever.  Pleuritic chest pain.  CHEST - 2 VIEW  Comparison: Two-view chest 01/15/2012.  Findings: Cardiac enlargement is stable.  Emphysematous changes are again noted.  A left pleural effusion and basilar airspace disease is noted.  Nodular densities are seen in the posterior mediastinum on the lateral view.  No definite disease is evident on the PA view. However, this is in the region of previous nodular disease on the CT scan.  The visualized soft tissues and bony thorax are unremarkable.  IMPRESSION:  1.  New left pleural effusion and basilar airspace disease.  While this may represent atelectasis, infection is not excluded. 2.  Nodular densities on the lateral view.  This could represent progression of pulmonary nodules.  Recommend CT of the chest without contrast for further evaluation.   Original Report Authenticated By: Marin Roberts, M.D.     [facility] You likely have a pneumonia  Not sure if you have influenza despite getting flu shot  Give blood for blood test and do a chest x-ray; if these are  normal will have her call you and do a CT scan or another test of the chest to look for blood clot in the lung  You absolutely need antibiotics despite multiple allergies to multiple antibiotics  - take levaquin 500mg  once daily X 7 days (extended duration based on your request]  - I do note that you have allergy to Avelox in terms of nausea and vomiting but because if taken ciprofloxacin in the past without any problems we're going to go ahead with Levaquin  Hold nitrofurantoin while taking Levaquin  Also take Tamiflu 75 mg twice a day for 5 days just in case you have influenza; please note this can make him nauseated and if so please call us and we can stop it  Return to prior visit unless otherwise stated  OV 03/18/2012  Followup for chronic cough and also recent pneumonia end January 2014  - Chronic cough is almost resolved. She says gabapentin was the best drug for  Cough. Currently RSI score is 5 and a similar to last visit. She still has some mild amount of green sputum which she says is similar to baseline. She thinks this is from postnasal drip. She says is compliant with sinus, acid reflux, asthma and irritable larynx treatment. She's completed speech therapy and is being discharged  -. In terms of pneumonia last month: The antibiotic Levaquin work well without any side effects. Also acute symptomatology of fever chills left pleuritic chest pain have all resolved. However 3 days into treatment for pneumonia she developed acute pleuritic chest pain on the contralateral side which is the right side. Apparently this pain was moderate in intensity  and lasted for 3 days and resolve spontaneously. There is no associated edema or hemoptysis at this time. Of note, at the time of diagnosis of left lower lobe pneumonia with pleural effusion her d-dimer was slightly high at 3 and but we deferred pulmonary embolism workup  REC #Cough  - continue all measures as before  - glad you saw Mr Sundra Aland  -  instead of coreg, talk to [facility], MD and change to generic Bisoprolol  #Pneumonia - left side  - clinically resolved.  - HAve CXR to ensure radiologic clearance  #Right pleurisy  - have d-dimer blood test, if elevated will order scan test for blood clot   OV 08/16/2012  Followup for chronic cough. Overall cough is improved although the cough score is slightly worse. 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 She feels this is due to allergies. She is open to seeing an allergist. Details of cough presented in the table below   Dr Gretta Cool Reflux Symptom Index (> 13-15 suggestive of LPR cough) May 2013 02/09/2012  02/26/2012 LL pna, acutely ill 03/18/2012  08/16/2012   Hoarseness of problem with voice 5 3 1 1 4   Clearing  Of Throat 5 4 1 1 4   Excess throat mucus or feeling of post nasal drip 3 4 1 2 4   Difficulty swallowing food, liquid or tablets 3 3 0 0 0  Cough after eating or lying down 5 3 0 0 0  Breathing difficulties or choking episodes 3 3 0 0 0  Troublesome or annoying cough 5 3 0 0 0  Sensation of something sticking in throat or lump in throat 4 3 0 0 0  Heartburn, chest pain, indigestion, or stomach acid coming up 4 1 0 1 1  TOTAL 37 27 3 5 13   intrevention  Will Rx with neurontin        Kouffman Reflux v Neurogenic Cough Differentiator May 2013 02/09/2012   03/18/2012  08/16/2012   Do you awaken from a sound sleep coughing violently?                            With trouble breathing? Yes n n n  Do you have choking episodes when you cannot  Get enough air, gasping for air ?              Yes n n N, yes if I am sock  Do you usually cough when you lie down into  The bed, or when you just lie down to rest ?                          Yes y sometimes n  Do you usually cough after meals or eating?         Yes sometime n n   4 1.5 0.5 0  Do you cough when (or after) you bend over?    Yes n n n  Do you more-or-less cough all day long? yes n no n   Does change of temperature make you cough? x y sometimes n  Does laughing or chuckling cause you to cough? yes YEs sometimes n  Do fumes (perfume, automobile fumes, burned  Toast, etc.,) cause you to cough ?      yes Yes sometimes No but will wheeze  Does speaking, singing, or talking on the phone cause you to cough   ?  yes Yes Sometimes sometimes  Total 4 3 2 1    11/29/2012 Acute OV  Complains of hoarseness, soreness in ribs, prod cough with green mucus, head congestion, PND, wheezing/rattling, chest tightness, low grade temp, chills/sweats x5days - denies hemoptysis, nausea, vomiting, edema. Appetite is okay.  Taking mucinex.  No dysphagia , overt reflux or choking episodes.  Complains of sinus congestion and drainage .         Review of Systems  Constitutional: Negative for fever and unexpected weight change.  HENT: Positive for postnasal drip. Negative for ear pain, nosebleeds, congestion, sore throat, rhinorrhea, sneezing, trouble swallowing, dental problem and sinus pressure.   Eyes: Negative for redness and itching.  Respiratory: Positive for cough. Negative for chest tightness, shortness of breath and wheezing.   Cardiovascular: Negative for palpitations and leg swelling.  Gastrointestinal: Negative for nausea and vomiting.  Genitourinary: Negative for dysuria.  Musculoskeletal: Negative for joint swelling.  Skin: Negative for rash.  Neurological: Negative for headaches.  Hematological: Does not bruise/bleed easily.  Psychiatric/Behavioral: Negative for dysphoric mood. The patient is not nervous/anxious.     Objective:   Physical Exam GEN: A/Ox3; pleasant , NAD,elderly   HEENT:  [place]/AT,  EACs-clear, TMs-wnl, NOSE-clear, THROAT-clear, no lesions, no postnasal drip or exudate noted.   NECK:  Supple w/ fair ROM; no JVD; normal carotid impulses w/o bruits; no thyromegaly or nodules palpated; no lymphadenopathy.  RESP  Diminished BS in bases, no accessory  muscle use, no dullness to percussion  CARD:  RRR, no m/r/g  , no peripheral edema, pulses intact, no cyanosis or clubbing.  GI:   Soft & nt; nml bowel sounds; no organomegaly or masses detected.  Musco: Warm bil, no deformities or joint swelling noted.   Neuro: alert, no focal deficits noted.    Skin: Warm, no lesions or rashes       Assessment & Plan:

## 2013-01-03 ENCOUNTER — Encounter: Payer: Self-pay | Admitting: Internal Medicine

## 2013-01-03 ENCOUNTER — Ambulatory Visit (INDEPENDENT_AMBULATORY_CARE_PROVIDER_SITE_OTHER): Payer: Medicare Other | Admitting: Internal Medicine

## 2013-01-03 VITALS — BP 142/90 | HR 97 | Ht 62.0 in | Wt 196.2 lb

## 2013-01-03 DIAGNOSIS — R05 Cough: Secondary | ICD-10-CM

## 2013-01-03 DIAGNOSIS — R059 Cough, unspecified: Secondary | ICD-10-CM

## 2013-01-03 NOTE — Patient Instructions (Addendum)
Continue current cough care including Neurontin, nasal treatment and acid reflux treatment Please note fish oil can make acid reflux worse; best interest if you stop it Please fu at [facility] to have your immune system sorted out REturn to see me in 7 months to report progress  - cough score at followup

## 2013-01-03 NOTE — Progress Notes (Signed)
Subjective:    Patient ID: Krystal Powell, female    DOB: 04/20/1945, 67 y.o.   MRN: [id number]  HPI  HPI 67 yo female seen for initial pulmonary consult 05/16/11 for chronic cough and recurrent lung infection x 20 months. PCP is HALL,ZACK, MD   05/16/11 Consult for chronic cough Reports recurrent lung infection x 20 months. Reports episodes of fever, green mucus, cough, wheezing, weakness. Rx with antibiotics and prednsione that helps temporarily but recurs after a few days of completing course. Even between episodes does not feel fully fine; still has some cough. Only 1 admission for this; 05/01/11 - 05/04/11 and CXR showed LLL consolidation. Never smoked though parents smoked as child. Gives hx of GERD and gives hx of esophageal dilatation 5-6 times (Dr Kendell [facility] in [place]) and apparently refractory. Still with dysphagia - needs to take liquid to swallow food and has difficulty with large pills (cuts antibiotics into 4 pieces). She herself suspects she is aspirating food into lungs (gives hx of several episodes of coughing up food especially in last 20 months).   Overall main issue appears to be chronic daily cough. RSI cough score is 38 out of 40 and very c/w LPR cough. Describes Level 5 - post nasal drip, difficulty swallowing, breathing difficulty/choking episodies, annoying cough, sensation of something in throat, and hearburn. Level 4 - hoarseness of voice or cough after lying down  Recollects hx of PFT [facility] 2012 [place] that reportedly showed copd. 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 Lived in [place] for 15 years for asthma. REcently saw DR Willa Rough allergist who she says was nervous to do allergy tests due to pulmonary hx.. Hx of sinus surger in [place] over 20 years ago. Saw DR Suszanne Conners in [facility] last year. Reports polyps and constant sinus drainage.  REcollects vocal cord nodule ablation by Dr Deland [facility] [place]  2013  Hx of allergy testing in approx 2011 by Dr Linus Orn Willa Rough - positive sking test. Was told "breathing too poor to give allergy shots". Says around that time went on O2 Rx due to a "blown up lung situation" but this resolved in a year  06/04/2011 Acute OV c/o chillis, fever, congestion, cough with green mucus, runny nose, and pain in her lungs for last 4 days.  Lots of nasal drainage. Increased wheezing. OTC not helping. Feels bad with no energy.  Has CT chest planned next week. No hemoptysis, chest pain , no edema. Last ABX 1 month ago.   REC Hold fish Oil for 1 month then place in freezer  Omnicef 300 mg twice daily for 7 days, take with food.  Mucinex DM twice daily as needed for cough and congestion.  Saline nasal rinses as needed.  Fluids and rest.  Prednisone taper for the next week.  Followup as planned for CT scan next week.  follow up Dr. Marchelle Gearing in 2 weeks as planned  Please contact office for sooner follow up if symptoms do not improve or worsen or seek emergency care    OV 06/17/2011 [place] chronic cough   Cough persists unchanged and is severe and associated with mild on and off green sputum.  She did see NP earlier this month for acute bronchitis episode with wheizng and given omnicef and prednisone burst and was asked to stopp fish oil. Current RSI cough score is 37 and unchanged and c/w LPR cough.  Kouffman cough differentiator - she scored 5 for GE reflux and 4 for neurogenic cough suggesting  bouth strong ge reflux and neurogenic components for cough. CT chest 06/06/11 - esssentially clear to my review exceot for scattered nodules. She has see Dr Rhea Belton of GI and per her hx had MBSS and esophageal manometry and both reportedly normal. FU and discussion about nissen fundoplication pending.   REviewed meds and noted she is on, flonase, zyrtec, benadryl, duoneb, dulera, singulair, protonix and znatac. Of note, she is on non specific beta blocker coreg through there is no hx  of chf     CT CHEST 06/06/11 Comparison: None  Correlation: Chest radiograph 05/01/2011  Findings:  Gallbladder surgically absent.  Remaining visualized portions of upper abdomen normal appearance.  Minimal atherosclerotic calcification.  Asymmetric density upper left breast 1.4 x 1.5 cm image 2.  Surgical clips left axilla.  No definite thoracic adenopathy.  Tracheobronchial cartilage calcification noted.  Atelectasis versus scarring in medial right upper lobe images 22-  27.  Additional atelectasis versus scarring in medial left lower lobe,  somewhat more focally dense.  Numerous tiny nodular foci noted throughout both lungs, some poorly  defined, measuring up to 5 mm diameter.  These are nonspecific in appearance of uncertain etiology; none are  definitively calcified to suggest old granulomatous disease.  No infiltrate, pleural effusion or pneumothorax.  Scattered end plate spur formation thoracic spine.  IMPRESSION:  Scattered areas of atelectasis versus scarring medial right upper  lobe and medial left lower lobe, slightly more prominent in left  lower lobe.  Numerous tiny noncalcified nodular foci throughout both lungs up to  5 mm greatest size, nonspecific.  If the patient is at high risk for bronchogenic carcinoma, follow-  up chest CT at 6-12 months is recommended. If the patient is at  low risk for bronchogenic carcinoma, follow-up chest CT at 12  months is recommended. This recommendation follows the consensus  statement: Guidelines for Management of Small Pulmonary Nodules  Detected on CT Scans: A Statement from the Fleischner Society as  published in Radiology 2005; 237:395-400.  0.  Original Report Authenticated By: Lollie Marrow, M.D.     [facility] It appears cough is due to acid reflux, neurogenic cough and maybe asthma but we need to rule out asthma component because the inhalers themselves could make cough worse  Hold dulera for 2 weeks but if you get worse  call us immediately  Then have methacholine challenge test  Call for me to review those results  Further plan based on those results  Update 10:33 PM on 06/17/2011  - will cal PMD annd have him dc coreg in case there is asthma and this non specific beta blocker is making it worse  OV 06/25/2011 - ACUTE VISIT Cough worse after stopping dulera while waiting for methacholine challenge. Worsening started 4 days after coming off dulera. Reports worsening cough that is dry, dyspnea, wheeze but no sputum. She saw GI yesterday and they are recommending current line of GERD mgmt with option of nissen fundoplication in future. Today RSI cough score is 34 (note she reports cough is worse but score is better/similar to last ov) and on cough differentiator she scored 4 out of 5 for GERD and 5 out of 5 for neurogenic cough. Spirometry today sows moderate obstuction  -fev1 1.38L/64%, Ratio 69. FVC 1.99L/73%, small airway 43%  Of note, she is now off coreg and off fish oil >>restart Dulera   12/29/2011 Acute OV  Complains of prod cough with green mucus, hoarseness, rattling in chest, increased SOB, chills/sweats x6 days.  Finished a pred pak yesterday that was left over.  Did not help. Still coughing up blood.  No chest pain, edema or fever.  otc cough meds not working.    Omnicef 300 mg twice daily for 10 days, take with food.  Mucinex DM twice daily as needed for cough and congestion.  Saline nasal rinses as needed.  Fluids and rest.  Prednisone taper for the next week.  Follow up Dr. Marchelle Gearing 6 weeks and As needed  Please contact office for sooner follow up if symptoms do not improve or worsen or seek emergency care    OV 02/09/2012  Overall cough better but still with significnat cough that is burdensome. Says she has a lot of kleenex due to cough. Cough is associtaed with green sputum and significant sinus drainage. For sinus: she uses nasal steroid, saline spray. Unable to do netti pot due to flow  into ear. For allergies: sees Dr Al Decant. Says she is not eligible for allergy shots due to "lung issue". Apparently Dr Willa Rough told her, " you do not breathe welll enough for you to get allergy shots"/. For sinus: also sees Dr Kennedy Bucker who apparentlyu has told her "you have congestion in your sinus". She gives hx feb 2013 of polyp removal from larynx at [facility]. For lungs: uses dulera. For GERD: protonix bid and zantac qhs. Back on fish oil due to resurgence of cholesteroil. Going back has not made cough worse.  In terms of BP: she is back on coreg because amlodipone made her tachy. RSI cough score improved from 37 to 27 but still because greater than 15 reflects LPR cough.      #Cough  - your cough will not go away as long as the irritants are there but we can work towards getting rid of it  - #for sinus  - do all the things you are doing but add neil med hypertonic saline spray 2 times each nostril at night  - will hold off antibiotics due to multiple allergies, chronic nature and lack of toxicity-  #acid reflux  -continue as before  #asthma  - continue dulera  #BP  - need to get off coreg; talk to PMD. THere is no way with asthma you can be on this medication  #Irritable larynx  - Take gabapentin 300mg  once daily x 3 days, then 300mg  twice daily x 3 days, then 300mg  three times daily to continue. If this makes you too sleepy or drowsy call us and we will cut your medication dosing down  - refer Mr Sundra Aland speech therapy  - at times you have urge to cough, drink water or chew on sugarless lozenge  #Acid REflux  - per [facility]  #Lung nodule  - CT chest may 2014 followu[  #REturn  - 6 weeks to report progress  - cough score at followup   OV 02/26/2012 acute visit for patient  Krystal Powell  After her last visit in starting Neurontin her cough has resolved as indicated by her RSI score of 3 compared to 27 in the past. However on February 24 2012 2 days ago, she developed acute onset fever  with chills at night. This was associated with feeling of exhaustion and chills and right worse. The next morning which was yesterday she developed acute onset moderate pleuritic pain of the chest on the left infra-axillary area. As stated before there  is no increase in cough in fact cough is better but she is having some green sputum that is more than baseline. She denies any associated hemoptysis, edema. She generally feels worn out. There is no nausea or vomiting. Of note, she has had a flu shot this season. Is no increased shortness of breath or wheezing     LABS Urine streptococcal antigen negative April 2013  D-dimer slightly high at 3   Dg Chest 2 View  02/26/2012  *RADIOLOGY REPORT*  Clinical Data: Fever.  Pleuritic chest pain.  CHEST - 2 VIEW  Comparison: Two-view chest 01/15/2012.  Findings: Cardiac enlargement is stable.  Emphysematous changes are again noted.  A left pleural effusion and basilar airspace disease is noted.  Nodular densities are seen in the posterior mediastinum on the lateral view.  No definite disease is evident on the PA view. However, this is in the region of previous nodular disease on the CT scan.  The visualized soft tissues and bony thorax are unremarkable.  IMPRESSION:  1.  New left pleural effusion and basilar airspace disease.  While this may represent atelectasis, infection is not excluded. 2.  Nodular densities on the lateral view.  This could represent progression of pulmonary nodules.  Recommend CT of the chest without contrast for further evaluation.   Original Report Authenticated By: Marin Roberts, M.D.     [facility] You likely have a pneumonia  Not sure if you have influenza despite getting flu shot  Give blood for blood test and do a chest x-ray; if these are normal will have her call you and do a CT scan or another test of the chest to look for blood clot in the lung  You absolutely need antibiotics despite multiple allergies to multiple antibiotics   - take levaquin 500mg  once daily X 7 days (extended duration based on your request]  - I do note that you have allergy to Avelox in terms of nausea and vomiting but because if taken ciprofloxacin in the past without any problems we're going to go ahead with Levaquin  Hold nitrofurantoin while taking Levaquin  Also take Tamiflu 75 mg twice a day for 5 days just in case you have influenza; please note this can make him nauseated and if so please call us and we can stop it  Return to prior visit unless otherwise stated  OV 03/18/2012  Followup for chronic cough and also recent pneumonia end January 2014  - Chronic cough is almost resolved. She says gabapentin was the best drug for  Cough. Currently RSI score is 5 and a similar to last visit. She still has some mild amount of green sputum which she says is similar to baseline. She thinks this is from postnasal drip. She says is compliant with sinus, acid reflux, asthma and irritable larynx treatment. She's completed speech therapy and is being discharged  -. In terms of pneumonia last month: The antibiotic Levaquin work well without any side effects. Also acute symptomatology of fever chills left pleuritic chest pain have all resolved. However 3 days into treatment for pneumonia she developed acute pleuritic chest pain on the contralateral side which is the right side. Apparently this pain was moderate in intensity and lasted for 3 days and resolve spontaneously. There is no associated edema or hemoptysis at this time. Of note, at the time of diagnosis of left lower lobe pneumonia with pleural effusion her d-dimer was slightly high at 3 and but we deferred pulmonary embolism workup  REC #  Cough  - continue all measures as before  - glad you saw Mr Sundra Aland  - instead of coreg, talk to [facility], MD and change to generic Bisoprolol  #Pneumonia - left side  - clinically resolved.  - HAve CXR to ensure radiologic clearance  #Right pleurisy  - have  d-dimer blood test, if elevated will order scan test for blood clot   OV 08/16/2012  Followup for chronic cough. Overall cough is improved although the cough score is slightly worse. 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 She feels this is due to allergies. She is open to seeing an allergist. Details of cough presented in the table below  #Cough  - continue all measures as before including neurontin - to evaluate allergy causes of severe chronic rhinosinusitis see Dr Maple Hudson in our office  #Followup   - Dr Maple Hudson allergist  - return to see me in 4 months or sooner  11/29/2012 Acute OV  Complains of hoarseness, soreness in ribs, prod cough with green mucus, head congestion, PND, wheezing/rattling, chest tightness, low grade temp, chills/sweats x5days - denies hemoptysis, nausea, vomiting, edema. Appetite is okay.  Taking mucinex.  No dysphagia , overt reflux or choking episodes.  Complains of sinus congestion and drainage .   REC levawuing  pred  OV 01/03/2013  Chief Complaint  Patient presents with  . Follow-up    Pt states she has PND that will cause her to cough some during the day.  Pt states cough is much improved.    Followup chronic cough. Overall cough is stable without is a score of 14 and detailed below. Some mild worsening this cough after lying down dysphagia and choking episodes which she feels is related to the recent respiratory infection a month ago. Past medical history reviewed: She did see Dr. Irena Cords at [facility] allergy and has been found to have insufficient immune response and has been referred to [facility]. Follow up on and that is pending   Dr Gretta Cool Reflux Symptom Index (> 13-15 suggestive of LPR cough) May 2013 02/09/2012  03/18/2012  08/16/2012  01/03/2013   Hoarseness of problem with voice 5 3 1 4 3   Clearing  Of Throat 5 4 1 4 3   Excess throat mucus or feeling of post nasal drip 3 4 2 4 3    Difficulty swallowing food, liquid or tablets 3 3 0 0 1  Cough after eating or lying down 5 3 0 0 1  Breathing difficulties or choking episodes 3 3 0 0 1  Troublesome or annoying cough 5 3 0 0 1  Sensation of something sticking in throat or lump in throat 4 3 0 0 0  Heartburn, chest pain, indigestion, or stomach acid coming up 4 1 1 1 1   TOTAL 37 27 5 13 14   intrevention  Will Rx with neurontin        Kouffman Reflux v Neurogenic Cough Differentiator May 2013 02/09/2012   03/18/2012  08/16/2012  01/03/2013   Do you awaken from a sound sleep coughing violently?                            With trouble breathing? Yes n n n   Do you have choking episodes when you cannot  Get enough air, gasping for air ?              Yes n n N, yes if  I am sock   Do you usually cough when you lie down into  The bed, or when you just lie down to rest ?                          Yes y sometimes n   Do you usually cough after meals or eating?         Yes sometime n n    4 1.5 0.5 0   Do you cough when (or after) you bend over?    Yes n n n   Do you more-or-less cough all day long? yes n no n   Does change of temperature make you cough? x y sometimes n   Does laughing or chuckling cause you to cough? yes YEs sometimes n   Do fumes (perfume, automobile fumes, burned  Toast, etc.,) cause you to cough ?      yes Yes sometimes No but will wheeze   Does speaking, singing, or talking on the phone cause you to cough   ?               yes Yes Sometimes sometimes   Total 4 3 2 1           Review of Systems  Constitutional: Negative for fever and unexpected weight change.  HENT: Positive for postnasal drip. Negative for congestion, dental problem, ear pain, nosebleeds, rhinorrhea, sinus pressure, sneezing, sore throat and trouble swallowing.   Eyes: Negative for redness and itching.  Respiratory: Positive for cough. Negative for chest tightness, shortness of breath and wheezing.   Cardiovascular: Negative for  palpitations and leg swelling.  Gastrointestinal: Negative for nausea and vomiting.  Genitourinary: Negative for dysuria.  Musculoskeletal: Negative for joint swelling.  Skin: Negative for rash.  Neurological: Negative for headaches.  Hematological: Does not bruise/bleed easily.  Psychiatric/Behavioral: Negative for dysphoric mood. The patient is not nervous/anxious.        Objective:   Physical Exam  Vitals reviewed. Constitutional: She is oriented to person, place, and time. She appears well-developed and well-nourished. No distress.  Mild post nasal drip present Sitting on a wheelchair  HENT:  Head: Normocephalic and atraumatic.  Right Ear: External ear normal.  Left Ear: External ear normal.  Mouth/Throat: Oropharynx is clear and moist. No oropharyngeal exudate.  Eyes: Conjunctivae and EOM are normal. Pupils are equal, round, and reactive to light. Right eye exhibits no discharge. Left eye exhibits no discharge. No scleral icterus.  Neck: Normal range of motion. Neck supple. No JVD present. No tracheal deviation present. No thyromegaly present.  Cardiovascular: Normal rate, regular rhythm, normal heart sounds and intact distal pulses.  Exam reveals no gallop and no friction rub.   No murmur heard. Pulmonary/Chest: Effort normal and breath sounds normal. No respiratory distress. She has no wheezes. She has no rales. She exhibits no tenderness.  Abdominal: Soft. Bowel sounds are normal. She exhibits no distension and no mass. There is no tenderness. There is no rebound and no guarding.  Musculoskeletal: Normal range of motion. She exhibits no edema and no tenderness.  Lymphadenopathy:    She has no cervical adenopathy.  Neurological: She is alert and oriented to person, place, and time. She has normal reflexes. No cranial nerve deficit. She exhibits normal muscle tone. Coordination normal.  Skin: Skin is warm and dry. No rash noted. She is not diaphoretic. No erythema. No pallor.   Psychiatric: She has a normal mood and  affect. Her behavior is normal. Judgment and thought content normal.          Assessment & Plan:

## 2013-01-14 NOTE — Assessment & Plan Note (Signed)
Chronic cough is stable  Continue current cough care including Neurontin, nasal treatment and acid reflux treatment Please note fish oil can make acid reflux worse; best interest if you stop it Please fu at [facility] to have your immune system sorted out REturn to see me in 7 months to report progress  - cough score at followup

## 2013-02-25 ENCOUNTER — Emergency Department (HOSPITAL_COMMUNITY)
Admission: EM | Admit: 2013-02-25 | Discharge: 2013-02-25 | Disposition: A | Discharge status: Home / Self Care | Payer: Medicare HMO | Attending: Emergency Medicine | Admitting: Emergency Medicine

## 2013-02-25 ENCOUNTER — Encounter (HOSPITAL_COMMUNITY): Payer: Self-pay | Admitting: Emergency Medicine

## 2013-02-25 DIAGNOSIS — R42 Dizziness and giddiness: Secondary | ICD-10-CM | POA: Insufficient documentation

## 2013-02-25 DIAGNOSIS — F411 Generalized anxiety disorder: Secondary | ICD-10-CM | POA: Insufficient documentation

## 2013-02-25 DIAGNOSIS — K219 Gastro-esophageal reflux disease without esophagitis: Secondary | ICD-10-CM | POA: Insufficient documentation

## 2013-02-25 DIAGNOSIS — J449 Chronic obstructive pulmonary disease, unspecified: Secondary | ICD-10-CM | POA: Insufficient documentation

## 2013-02-25 DIAGNOSIS — E7800 Pure hypercholesterolemia, unspecified: Secondary | ICD-10-CM | POA: Insufficient documentation

## 2013-02-25 DIAGNOSIS — J4489 Other specified chronic obstructive pulmonary disease: Secondary | ICD-10-CM | POA: Insufficient documentation

## 2013-02-25 DIAGNOSIS — I10 Essential (primary) hypertension: Secondary | ICD-10-CM | POA: Insufficient documentation

## 2013-02-25 DIAGNOSIS — Q391 Atresia of esophagus with tracheo-esophageal fistula: Secondary | ICD-10-CM | POA: Insufficient documentation

## 2013-02-25 DIAGNOSIS — Z8701 Personal history of pneumonia (recurrent): Secondary | ICD-10-CM | POA: Insufficient documentation

## 2013-02-25 DIAGNOSIS — Z8619 Personal history of other infectious and parasitic diseases: Secondary | ICD-10-CM | POA: Insufficient documentation

## 2013-02-25 DIAGNOSIS — R1110 Vomiting, unspecified: Secondary | ICD-10-CM | POA: Insufficient documentation

## 2013-02-25 DIAGNOSIS — Q393 Congenital stenosis and stricture of esophagus: Secondary | ICD-10-CM

## 2013-02-25 DIAGNOSIS — E119 Type 2 diabetes mellitus without complications: Secondary | ICD-10-CM | POA: Insufficient documentation

## 2013-02-25 DIAGNOSIS — Z87898 Personal history of other specified conditions: Secondary | ICD-10-CM | POA: Insufficient documentation

## 2013-02-25 DIAGNOSIS — IMO0002 Reserved for concepts with insufficient information to code with codable children: Secondary | ICD-10-CM | POA: Insufficient documentation

## 2013-02-25 DIAGNOSIS — Z791 Long term (current) use of non-steroidal anti-inflammatories (NSAID): Secondary | ICD-10-CM | POA: Insufficient documentation

## 2013-02-25 DIAGNOSIS — Z79899 Other long term (current) drug therapy: Secondary | ICD-10-CM | POA: Insufficient documentation

## 2013-02-25 DIAGNOSIS — E039 Hypothyroidism, unspecified: Secondary | ICD-10-CM | POA: Insufficient documentation

## 2013-02-25 DIAGNOSIS — M129 Arthropathy, unspecified: Secondary | ICD-10-CM | POA: Insufficient documentation

## 2013-02-25 DIAGNOSIS — G8929 Other chronic pain: Secondary | ICD-10-CM | POA: Insufficient documentation

## 2013-02-25 HISTORY — DX: Zoster without complications: B02.9

## 2013-02-25 LAB — CBC WITH DIFFERENTIAL/PLATELET
BASOS ABS: 0.1 10*3/uL (ref 0.0–0.1)
Basophils Relative: 1 % (ref 0–1)
EOS PCT: 1 % (ref 0–5)
Eosinophils Absolute: 0.1 10*3/uL (ref 0.0–0.7)
HCT: 46.5 % — ABNORMAL HIGH (ref 36.0–46.0)
Hemoglobin: 16.2 g/dL — ABNORMAL HIGH (ref 12.0–15.0)
LYMPHS ABS: 2.5 10*3/uL (ref 0.7–4.0)
LYMPHS PCT: 20 % (ref 12–46)
MCH: 31.4 pg (ref 26.0–34.0)
MCHC: 34.8 g/dL (ref 30.0–36.0)
MCV: 90.1 fL (ref 78.0–100.0)
Monocytes Absolute: 1.1 10*3/uL — ABNORMAL HIGH (ref 0.1–1.0)
Monocytes Relative: 9 % (ref 3–12)
NEUTROS ABS: 8.8 10*3/uL — AB (ref 1.7–7.7)
Neutrophils Relative %: 70 % (ref 43–77)
PLATELETS: 252 10*3/uL (ref 150–400)
RBC: 5.16 MIL/uL — AB (ref 3.87–5.11)
RDW: 13.1 % (ref 11.5–15.5)
WBC: 12.6 10*3/uL — AB (ref 4.0–10.5)

## 2013-02-25 LAB — COMPREHENSIVE METABOLIC PANEL
ALBUMIN: 3.8 g/dL (ref 3.5–5.2)
ALT: 17 U/L (ref 0–35)
AST: 20 U/L (ref 0–37)
Alkaline Phosphatase: 107 U/L (ref 39–117)
BUN: 18 mg/dL (ref 6–23)
CHLORIDE: 103 meq/L (ref 96–112)
CO2: 26 mEq/L (ref 19–32)
Calcium: 9.3 mg/dL (ref 8.4–10.5)
Creatinine, Ser: 0.96 mg/dL (ref 0.50–1.10)
GFR calc Af Amer: 69 mL/min — ABNORMAL LOW (ref >90)
GFR calc non Af Amer: 60 mL/min — ABNORMAL LOW (ref >90)
GLUCOSE: 144 mg/dL — AB (ref 70–99)
Potassium: 3.5 mEq/L — ABNORMAL LOW (ref 3.7–5.3)
Sodium: 145 mEq/L (ref 137–147)
Total Bilirubin: 0.4 mg/dL (ref 0.3–1.2)
Total Protein: 7.5 g/dL (ref 6.0–8.3)

## 2013-02-25 MED ORDER — DIAZEPAM 5 MG/ML IJ SOLN
5.0000 mg | Freq: Once | INTRAMUSCULAR | Status: AC
  Administered 2013-02-25: 5 mg via INTRAVENOUS
  Filled 2013-02-25: qty 2

## 2013-02-25 MED ORDER — SODIUM CHLORIDE 0.9 % IV BOLUS (SEPSIS)
1000.0000 mL | Freq: Once | INTRAVENOUS | Status: AC
  Administered 2013-02-25: 1000 mL via INTRAVENOUS

## 2013-02-25 MED ORDER — DIAZEPAM 5 MG PO TABS: ORAL_TABLET | ORAL | Status: DC

## 2013-02-25 NOTE — ED Notes (Signed)
Per patient became dizzy on roadtrip back home from [place]. Patient has hx of vertigo and reports taking meclizine. Patient states "I have taken as much as I can keep down." Per patient vomiting with chils/sweats. Patient also reports hypertension and headache. Denies weakness on certain side. Per patient finally able to take blood pressure medication last night. Per patient has not been able to take blood pressure medication since Tuesday night.  Per patient took Protonix and meclizine at 7 this morning.

## 2013-02-25 NOTE — Discharge Instructions (Signed)
Valium as prescribed as needed for vertigo.  Return to the emergency department if you develop severe headache, worsening of your symptoms, or other new and concerning symptoms.   Vertigo Vertigo means you feel like you or your surroundings are moving when they are not. Vertigo can be dangerous if it occurs when you are at work, driving, or performing difficult activities.  CAUSES  Vertigo occurs when there is a conflict of signals sent to your brain from the visual and sensory systems in your body. There are many different causes of vertigo, including:  Infections, especially in the inner ear.  A bad reaction to a drug or misuse of alcohol and medicines.  Withdrawal from drugs or alcohol.  Rapidly changing positions, such as lying down or rolling over in bed.  A migraine headache.  Decreased blood flow to the brain.  Increased pressure in the brain from a head injury, infection, tumor, or bleeding. SYMPTOMS  You may feel as though the world is spinning around or you are falling to the ground. Because your balance is upset, vertigo can cause nausea and vomiting. You may have involuntary eye movements (nystagmus). DIAGNOSIS  Vertigo is usually diagnosed by physical exam. If the cause of your vertigo is unknown, your caregiver may perform imaging tests, such as an MRI scan (magnetic resonance imaging). TREATMENT  Most cases of vertigo resolve on their own, without treatment. Depending on the cause, your caregiver may prescribe certain medicines. If your vertigo is related to body position issues, your caregiver may recommend movements or procedures to correct the problem. In rare cases, if your vertigo is caused by certain inner ear problems, you may need surgery. HOME CARE INSTRUCTIONS   Follow your caregiver's instructions.  Avoid driving.  Avoid operating heavy machinery.  Avoid performing any tasks that would be dangerous to you or others during a vertigo episode.  Tell your  caregiver if you notice that certain medicines seem to be causing your vertigo. Some of the medicines used to treat vertigo episodes can actually make them worse in some people. SEEK IMMEDIATE MEDICAL CARE IF:   Your medicines do not relieve your vertigo or are making it worse.  You develop problems with talking, walking, weakness, or using your arms, hands, or legs.  You develop severe headaches.  Your nausea or vomiting continues or gets worse.  You develop visual changes.  A family member notices behavioral changes.  Your condition gets worse. MAKE SURE YOU:  Understand these instructions.  Will watch your condition.  Will get help right away if you are not doing well or get worse. Document Released: 10/23/2004 Document Revised: 04/07/2011 Document Reviewed: 08/01/2010 [facility] Patient Information 2014 [place].

## 2013-02-25 NOTE — ED Provider Notes (Signed)
CSN: [id number]     Arrival date & time 02/25/13  1019 History  This chart was scribed for Veryl Speak, MD by Roe Coombs, ED Scribe. The patient was seen in room APA14/APA14. Patient's care was started at 10:47 AM.    Chief Complaint  Patient presents with  . Emesis  . Dizziness    The history is provided by the patient. No language interpreter was used.    HPI Comments: Krystal Powell is a 68 y.o. female with a history of vertigo who presents to the Emergency Department complaining of constant dizziness onset 2 days ago. She states that she recently returned from a road trip in [place] when she began having dizziness in the car. Patient says that she has taken meclizine which usually improves symptoms, but this has not provided relief. Her last dose of meclizine was 4 hours ago. Dizziness is improved when she is lying down and is worse with any movement. There is associated vomiting and patient reports multiple episodes per day since the onset of this vertigo episode. She states that this is her most severe vertigo episode. She says that in the past, she has had vertigo episodes that caused her blood pressure to spike and required her to go the ED for management. Her other medical history includes HTN, COPD, DM (treated with metformin), hypercholesteremia and chronic back pain (often treated with prednisone).    Past Medical History  Diagnosis Date  . COPD (chronic obstructive pulmonary disease)   . Hypertension   . Asthma   . GERD (gastroesophageal reflux disease)   . Hiatal hernia   . Schatzki's ring   . Adenomatous polyps     Dr Arlana Pouch  . Spondylosis   . Hypercholesteremia   . Anxiety   . Hypothyroidism   . PONV (postoperative nausea and vomiting)   . Degenerative disc disease   . Chronic back pain   . Larynx disease     pt's left side if stiff and has protein deposits on it which causes her to lose air on that side and can't talk as well due to it  . Arthritis   . DM  (diabetes mellitus)     when on prednisone  . Status post dilation of esophageal narrowing   . Fibromyalgia   . History of gallstones   . IBS (irritable bowel syndrome)   . History of pneumonia   . Shingles    Past Surgical History  Procedure Laterality Date  . Knee surgery      multiple knee arthroscopies  . Tonsillectomy and adenoidectomy    . Bladder suspension    . Partial hysterectomy    . Appendectomy    . Cholecystectomy    . Umbilical hernia repair    . Laparoscopy    . Carotid endarterectomy      right  . Sinus exploration      x 2  . Shoulder surgery      right  . Foot surgery      right  . Maloney dilation  12/18/2010    Procedure: Venia Minks DILATION;  Surgeon: Daneil Dolin, MD;  Location: [facility];  Service: Endoscopy;  Laterality: N/A;  #56 dilator  . Abdominal hysterectomy      partial  . Tubal ligation    . Cataract extraction w/phaco  03/24/2011    Procedure: CATARACT EXTRACTION PHACO AND INTRAOCULAR LENS PLACEMENT (IOC);  Surgeon: Tonny Branch, MD;  Location: [facility];  Service: Ophthalmology;  Laterality: Left;  CDE 15.27  . Cataract extraction w/phaco  04/03/2011    Procedure: CATARACT EXTRACTION PHACO AND INTRAOCULAR LENS PLACEMENT (IOC);  Surgeon: Tonny Branch, MD;  Location: [facility];  Service: Ophthalmology;  Laterality: Right;  CDE:7.41  . Breast excisional biopsy      left, x 3-4  . Breast excisional biopsy      right  . Larynx surgery      polyp removed  . Esophageal manometry  06/09/2011    Procedure: ESOPHAGEAL MANOMETRY (EM);  Surgeon: Jerene Bears, MD;  Location: [facility];  Service: Gastroenterology;  Laterality: N/A;  . Lumbar laminectomy/decompression microdiscectomy  01/19/2012    Procedure: LUMBAR LAMINECTOMY/DECOMPRESSION MICRODISCECTOMY 1 LEVEL;  Surgeon: Winfield Cunas, MD;  Location: [facility];  Service: Neurosurgery;  Laterality: Left;  LEFT Lumbar three-four diskectomy   Family History  Problem Relation Age of Onset  . GER disease  Mother   . Goiter Mother     malignant  . GER disease Father   . Anesthesia problems Neg Hx   . Malignant hyperthermia Neg Hx   . Pseudochol deficiency Neg Hx   . Colon polyps Daughter   . Colon polyps Son   . Diabetes Sister   . Diabetes Brother   . Heart disease Mother   . Heart disease Father   . Heart disease Brother   . Irritable bowel syndrome Mother    History  Substance Use Topics  . Smoking status: Never Smoker   . Smokeless tobacco: Never Used  . Alcohol Use: No   OB History   Grav Para Term Preterm Abortions TAB SAB Ect Mult Living                 Review of Systems A complete 10 system review of systems was obtained and all systems are negative except as noted in the HPI and PMH.   Allergies  Sulfonamide derivatives; Adhesive; Amoxicillin-pot clavulanate; Avelox; Demeclocycline hcl; Doxycycline; Iodinated diagnostic agents; Iodine; Iohexol; Thorazine; Codeine; and Erythromycin  Home Medications   Current Outpatient Rx  Name  Route  Sig  Dispense  Refill  . acetaminophen (TYLENOL) 500 MG tablet   Oral   Take 1,000 mg by mouth 2 (two) times daily.           Marland Kitchen albuterol (PROVENTIL HFA;VENTOLIN HFA) 108 (90 BASE) MCG/ACT inhaler   Inhalation   Inhale 2 puffs into the lungs every 6 (six) hours as needed. For shortness of breath          . ALPRAZolam (XANAX) 0.5 MG tablet   Oral   Take 0.5 mg by mouth at bedtime.          Marland Kitchen azelastine (ASTELIN) 137 MCG/SPRAY nasal spray   Nasal   Place 1 spray into the nose 2 (two) times daily. Use in each nostril as directed         . celecoxib (CELEBREX) 200 MG capsule   Oral   Take 200 mg by mouth daily.         . cetirizine (ZYRTEC) 10 MG tablet   Oral   Take 10 mg by mouth at bedtime.          . Cholecalciferol (VITAMIN D) 2000 UNITS tablet   Oral   Take 2,000 Units by mouth every morning.           . Cyanocobalamin 2500 MCG TABS   Oral   Take 1 tablet by mouth every morning.          Marland Kitchen  cyclobenzaprine (FLEXERIL) 10 MG tablet   Oral   Take 10 mg by mouth 3 (three) times daily as needed for muscle spasms.         Marland Kitchen diltiazem (CARDIZEM) 120 MG tablet   Oral   Take 120 mg by mouth daily.         . diphenhydrAMINE (BENADRYL) 25 MG tablet   Oral   Take 25 mg by mouth at bedtime.         . fluticasone (FLONASE) 50 MCG/ACT nasal spray   Nasal   Place 1 spray into the nose 2 (two) times daily.         Marland Kitchen gabapentin (NEURONTIN) 300 MG capsule   Oral   Take 1 capsule (300 mg total) by mouth 2 (two) times daily.   60 capsule   3   . ipratropium-albuterol (DUONEB) 0.5-2.5 (3) MG/3ML SOLN   Nebulization   Take 3 mLs by nebulization 4 (four) times daily as needed. For shortness of breath         . levothyroxine (SYNTHROID, LEVOTHROID) 100 MCG tablet   Oral   Take 100 mcg by mouth every morning.          . meclizine (ANTIVERT) 25 MG tablet   Oral   Take 50 mg by mouth 4 (four) times daily. For dizziness         . metFORMIN (GLUCOPHAGE) 500 MG tablet   Oral   Take 500 mg by mouth daily with breakfast.         . mometasone-formoterol (DULERA) 100-5 MCG/ACT AERO   Inhalation   Inhale 2 puffs into the lungs 2 (two) times daily.         . Omega-3 Fatty Acids (FISH OIL) 1000 MG CAPS   Oral   Take 1 capsule by mouth every morning.          . pantoprazole (PROTONIX) 40 MG tablet   Oral   Take 40 mg by mouth 2 (two) times daily. With breakfast and supper         . potassium chloride (K-DUR,KLOR-CON) 10 MEQ tablet   Oral   Take 10 mEq by mouth every morning.          . pravastatin (PRAVACHOL) 20 MG tablet   Oral   Take 20 mg by mouth at bedtime.          . ranitidine (ZANTAC) 150 MG tablet   Oral   Take 150 mg by mouth at bedtime.         . Red Yeast Rice 600 MG CAPS   Oral   Take 1 capsule by mouth every morning.           Marland Kitchen SINGULAIR 10 MG tablet   Oral   Take 10 mg by mouth at bedtime.          Marland Kitchen trimethoprim (TRIMPEX)  100 MG tablet   Oral   Take 100 mg by mouth at bedtime.         . vitamin E 1000 UNIT capsule   Oral   Take 1,000 Units by mouth every morning.            Triage Vitals: BP 156/90  Pulse 116  Temp(Src) 97.9 F (36.6 C) (Oral)  Resp 20  Ht 5\' 2"  (1.575 m)  Wt 196 lb (88.905 kg)  BMI 35.84 kg/m2  SpO2 97% Physical Exam  Constitutional: She is oriented to person, place, and time. She appears well-developed and  well-nourished.  HENT:  Head: Normocephalic and atraumatic.  Eyes: Conjunctivae and EOM are normal. Pupils are equal, round, and reactive to light.  Neck: Normal range of motion. Neck supple.  Cardiovascular: Normal rate, regular rhythm and normal heart sounds.   No murmur heard. Pulmonary/Chest: Effort normal and breath sounds normal. No respiratory distress. She has no wheezes. She has no rales.  Musculoskeletal: Normal range of motion. She exhibits no edema and no tenderness.  Neurological: She is alert and oriented to person, place, and time. No cranial nerve deficit. She exhibits normal muscle tone. Coordination normal.  Symptoms are exacerbated with movement and turning head.  Skin: Skin is warm and dry.    ED Course  Procedures (including critical care time) DIAGNOSTIC STUDIES: Oxygen Saturation is 97% on room air, adequate by my interpretation.    COORDINATION OF CARE: 10:54 AM- Will order CBC with diff and CMP, and give IV fluids and Valium. Patient informed of current plan for treatment and evaluation and agrees with plan at this time.     Labs Review Labs Reviewed  CBC WITH DIFFERENTIAL - Abnormal; Notable for the following:    WBC 12.6 (*)    RBC 5.16 (*)    Hemoglobin 16.2 (*)    HCT 46.5 (*)    Neutro Abs 8.8 (*)    Monocytes Absolute 1.1 (*)    All other components within normal limits  COMPREHENSIVE METABOLIC PANEL - Abnormal; Notable for the following:    Potassium 3.5 (*)    Glucose, Bld 144 (*)    GFR calc non Af Amer 60 (*)    GFR  calc Af Amer 69 (*)    All other components within normal limits     EKG Interpretation    Date/Time:  Friday February 25 2013 10:35:11 EST Ventricular Rate:  120 PR Interval:  164 QRS Duration: 78 QT Interval:  328 QTC Calculation: 463 R Axis:   -157 Text Interpretation:  Sinus tachycardia with Premature atrial complexes Right superior axis deviation Pulmonary disease pattern Abnormal ECG When compared with ECG of 15-Jan-2012 10:12, Premature atrial complexes are now Present Vent. rate has increased BY  47 BPM T wave inversion no longer evident in Inferior leads Nonspecific T wave abnormality no longer evident in Anterior leads Confirmed by DELOS  MD, Shepard Keltz ([id number]) on 02/25/2013 10:55:01 AM            MDM  No diagnosis found. Patient is a 69 year old female who presents with dizziness. She has a long history of vertigo and recently went on a car trip to [place]. This has made her dizziness much worse and is now unrelieved with meclizine which normally helps her. Physical examination is unremarkable and neurologic exam is nonfocal. Her symptoms to worsen with turning the head and movement. This is consistent with a peripheral vertigo. She was given IV fluids and Valium and is now actually feeling better. She will be discharged to home with a prescription for this to be used in place of her meclizine for the next several days. She understands to return if her symptoms significantly worsen or change.   I personally performed the services described in this documentation, which was scribed in my presence. The recorded information has been reviewed and is accurate.      Veryl Speak, MD 02/25/13 1225

## 2013-06-22 ENCOUNTER — Telehealth: Payer: Self-pay

## 2013-06-22 NOTE — Telephone Encounter (Signed)
I talked with [facility] today because they want a [facility] path report on her from 04/03/11. I told her that I did not see a path report. When we got to looking there was a [facility] report in her chart from 04/03/11 but it was not hers. I talked with Ronnald Collum at [facility] to see if she could remove the report from her chart. She stated that she would take care of it.

## 2013-06-22 NOTE — Telephone Encounter (Signed)
Noted  

## 2013-06-23 ENCOUNTER — Telehealth: Payer: Self-pay | Admitting: Gastroenterology

## 2013-06-23 NOTE — Telephone Encounter (Signed)
lvm for pt to call me back regarding if she is planning on following up with Dr. Hilarie Fredrickson for her recall colonoscopy

## 2013-07-18 ENCOUNTER — Telehealth: Payer: Self-pay | Admitting: Gastroenterology

## 2013-07-18 NOTE — Telephone Encounter (Signed)
lvm for pt to call me back regarding who will be continuing her care for G.I  Dr. Hilarie Fredrickson or Dr. Gala Romney

## 2013-08-04 ENCOUNTER — Encounter: Payer: Self-pay | Admitting: Orthopedic Surgery

## 2013-08-04 ENCOUNTER — Ambulatory Visit (INDEPENDENT_AMBULATORY_CARE_PROVIDER_SITE_OTHER): Payer: Commercial Managed Care - HMO

## 2013-08-04 ENCOUNTER — Ambulatory Visit (INDEPENDENT_AMBULATORY_CARE_PROVIDER_SITE_OTHER): Payer: Commercial Managed Care - HMO | Admitting: Orthopedic Surgery

## 2013-08-04 VITALS — BP 142/80 | Ht 62.0 in | Wt 196.0 lb

## 2013-08-04 DIAGNOSIS — M25562 Pain in left knee: Secondary | ICD-10-CM

## 2013-08-04 DIAGNOSIS — M25569 Pain in unspecified knee: Secondary | ICD-10-CM

## 2013-08-04 DIAGNOSIS — M1712 Unilateral primary osteoarthritis, left knee: Secondary | ICD-10-CM

## 2013-08-04 DIAGNOSIS — M1710 Unilateral primary osteoarthritis, unspecified knee: Secondary | ICD-10-CM

## 2013-08-04 NOTE — Patient Instructions (Addendum)
Wear brace at all times except for bathing and sleeping Pick up home exercise sheet from physical therapy dept

## 2013-08-04 NOTE — Progress Notes (Signed)
Subjective:     Patient ID: Krystal Powell, female   DOB: [birth year], 68 y.o.   MRN: [id number]  Knee Pain    primary care physician Dr. Nevada Crane  Chief complaint left knee pain  68 year old female who hand multiple left knee arthroscopies and multiple right knee arthroscopies the most recent being in 2012 presents after injuring her left knee again. She got up felt a sharp pulling sensation in the front of her knee over the patellar tendon had difficulty walking and had to use a walker for several weeks that pain subsequently got better but she noticed increasing medial joint line pain giving out of the left knee and subsequent difficulties with standing walking sitting. Pain 7/10. Pain present for the last 2 months.   Review of Systems System review primarily systems positive include your nose and throat some hearing loss ringing in the ears sinusitis sore throat  She's had some irregular heartbeat in the past  She's had some respiratory issues with shortness of breath cough and breathing difficulties but is at her best year and is no longer on oxygen  Musculoskeletal complaints as described. She has some difficulties with vision cataracts removed she has some psoriasis/eczema occasional dizziness endocrine complaints of temperature disturbances polydipsia. Frequent bladder infections currently on antibiotics daily.      Objective:   Physical Exam BP 142/80  Ht 5\' 2"  (1.575 m)  Wt 196 lb (88.905 kg)  BMI 35.84 kg/m2  General appearance: the patient is well-developed and well-nourished, grooming and hygiene are normal, body habitus M/L  The patient is alert and oriented x 3; mood and affect are normal  Ambulatory status cane support for ambulation, she tried length is diminished, cadence shortened.  Right knee/Left knee Inspection right knee no effusion mild joint line tenderness Range of motion arc of flexion 125 The Lachman test is normal the anterior and posterior drawer tests are  normal and the collateral ligaments are stable Motor exam 5/5 Skin normal; no rash or laceration  McMurray's sign negative  The left knee knee Inspection revealed medial joint line tenderness medial femoral condylar tenderness, no effusion. Flexion ARC 125. Cruciate ligament stable. Collateral ligament stable. Muscle tone normal. Extensor mechanism intact tenderness over the patellar tendon with good strength.  skin norma burying areas of ecchymosis. She does have a McMurray sign.  Cardiovascular exam normal pulse and perfusion without edema tenderness or  major varicose veins  Sensory exam is normal  X-rays   Moderate to severe osteo-arthritis left knee    Assessment:      progressing osteoarthritis of the left knee setting of history of respiratory issues with improvement. If we do go to knee replacement we may have to get a urology consult to address the chronic urinary tract infection.    Plan:     Osteo-arthritis Depue unloader brace, home physical therapy, followup 6 weeks

## 2013-08-16 ENCOUNTER — Ambulatory Visit (HOSPITAL_COMMUNITY): Payer: Commercial Managed Care - HMO | Admitting: Physical Therapy

## 2013-08-18 ENCOUNTER — Ambulatory Visit (HOSPITAL_COMMUNITY)
Admission: RE | Admit: 2013-08-18 | Discharge: 2013-08-18 | Disposition: A | Discharge status: Home / Self Care | Payer: Medicare HMO | Source: Ambulatory Visit | Attending: Orthopedic Surgery | Admitting: Orthopedic Surgery

## 2013-08-18 DIAGNOSIS — M25569 Pain in unspecified knee: Secondary | ICD-10-CM | POA: Diagnosis not present

## 2013-08-18 DIAGNOSIS — I10 Essential (primary) hypertension: Secondary | ICD-10-CM | POA: Diagnosis not present

## 2013-08-18 DIAGNOSIS — R29898 Other symptoms and signs involving the musculoskeletal system: Secondary | ICD-10-CM | POA: Insufficient documentation

## 2013-08-18 DIAGNOSIS — E119 Type 2 diabetes mellitus without complications: Secondary | ICD-10-CM | POA: Insufficient documentation

## 2013-08-18 DIAGNOSIS — J4489 Other specified chronic obstructive pulmonary disease: Secondary | ICD-10-CM | POA: Insufficient documentation

## 2013-08-18 DIAGNOSIS — IMO0001 Reserved for inherently not codable concepts without codable children: Secondary | ICD-10-CM | POA: Diagnosis present

## 2013-08-18 DIAGNOSIS — J449 Chronic obstructive pulmonary disease, unspecified: Secondary | ICD-10-CM | POA: Diagnosis not present

## 2013-08-18 NOTE — Evaluation (Signed)
Physical Therapy Evaluation  Patient Details  Name: Krystal Powell MRN: [id number] Date of Birth: 30-Oct-1945  Today's Date: 08/18/2013 Time: 1110-1159 PT Time Calculation (min): 49 min Charge:   evaluation             Visit#: 1 of 1  Re-eval:   Assessment Diagnosis: Lt knee strain Next MD Visit: 09/13/2013 Prior Therapy: yrs ago  Authorization: [employer]    Past Medical History:  Past Medical History  Diagnosis Date  . COPD (chronic obstructive pulmonary disease)   . Hypertension   . Asthma   . GERD (gastroesophageal reflux disease)   . Hiatal hernia   . Schatzki's ring   . Adenomatous polyps     Dr Arlana Pouch  . Spondylosis   . Hypercholesteremia   . Anxiety   . Hypothyroidism   . PONV (postoperative nausea and vomiting)   . Degenerative disc disease   . Chronic back pain   . Larynx disease     pt's left side if stiff and has protein deposits on it which causes her to lose air on that side and can't talk as well due to it  . Arthritis   . DM (diabetes mellitus)     when on prednisone  . Status post dilation of esophageal narrowing   . Fibromyalgia   . History of gallstones   . IBS (irritable bowel syndrome)   . History of pneumonia   . Shingles    Past Surgical History:  Past Surgical History  Procedure Laterality Date  . Knee surgery      multiple knee arthroscopies  . Tonsillectomy and adenoidectomy    . Bladder suspension    . Partial hysterectomy    . Appendectomy    . Cholecystectomy    . Umbilical hernia repair    . Laparoscopy    . Carotid endarterectomy      right  . Sinus exploration      x 2  . Shoulder surgery      right  . Foot surgery      right  . Maloney dilation  12/18/2010    Procedure: Venia Minks DILATION;  Surgeon: Daneil Dolin, MD;  Location: [facility];  Service: Endoscopy;  Laterality: N/A;  #56 dilator  . Abdominal hysterectomy      partial  . Tubal ligation    . Cataract extraction w/phaco  03/24/2011    Procedure: CATARACT EXTRACTION  PHACO AND INTRAOCULAR LENS PLACEMENT (IOC);  Surgeon: Tonny Branch, MD;  Location: [facility];  Service: Ophthalmology;  Laterality: Left;  CDE 15.27  . Cataract extraction w/phaco  04/03/2011    Procedure: CATARACT EXTRACTION PHACO AND INTRAOCULAR LENS PLACEMENT (IOC);  Surgeon: Tonny Branch, MD;  Location: [facility];  Service: Ophthalmology;  Laterality: Right;  CDE:7.41  . Breast excisional biopsy      left, x 3-4  . Breast excisional biopsy      right  . Larynx surgery      polyp removed  . Esophageal manometry  06/09/2011    Procedure: ESOPHAGEAL MANOMETRY (EM);  Surgeon: Jerene Bears, MD;  Location: [facility];  Service: Gastroenterology;  Laterality: N/A;  . Lumbar laminectomy/decompression microdiscectomy  01/19/2012    Procedure: LUMBAR LAMINECTOMY/DECOMPRESSION MICRODISCECTOMY 1 LEVEL;  Surgeon: Winfield Cunas, MD;  Location: [place] [facility];  Service: Neurosurgery;  Laterality: Left;  LEFT Lumbar three-four diskectomy    Subjective Symptoms/Limitations Symptoms: Pt reinjured Lt knee a couple of months ago.  She states that the only time  she has pain is with weight bearing.  The pain is not waking her up at night.  She normally uses a cane to ambulate.  If she has to walk greater than 2-3 minutes than she uses her wheelchair.   Pertinent History: 68 year old female who has had multiple left knee arthroscopies and multiple right knee arthroscopies the most recent being in 2012.  She  presents after injuring her left knee; she went to stand up and  felt a sharp pulling sensation in the front of her knee over the patellar tendon.  She then  had difficulty walking and had to use a walker for several weeks that pain subsequently got better but she noticed increasing medial joint line pain giving out of the left knee and subsequent difficulties with standing walking sitting. Pain 7/10. Pain present for the last 2 months How long can you sit comfortably?: Pt states that she normally always has her leg up in a  recliner.  She has more pain coming to sit to stand than with sitting.  How long can you stand comfortably?: Pt states she has immediate pain upon standingl.  She states the knee feels like it is going to pop out of place.   She always uses her Lt hand to hold because she does not trust her knee. How long can you walk comfortably?: Pt can not walk any distance due to only having 60% of lung capacity.  Patient Stated Goals: decrease pain, able to stand without her knee feeling like it is going to give way on her.  Pain Assessment Currently in Pain?: Yes Pain Score: 0-No pain (worst pain in the week 9/10; sit to stand 7/10 ) Pain Location: Knee Pain Orientation: Left Pain Type: Acute pain Pain Relieving Factors: sitting  Effect of Pain on Daily Activities: increases   Balance Screening Balance Screen Has the patient fallen in the past 6 months: No    Sensation/Coordination/Flexibility/Functional Tests Functional Tests Functional Tests: foto 37  Assessment LLE AROM (degrees) Left Knee Extension: 0 Left Knee Flexion: 125 LLE Strength Left Hip Flexion: 4/5 Left Hip Extension: 3-/5 Left Hip ABduction:  (4-/5) Left Hip ADduction: 4/5 Left Knee Flexion: 5/5 Left Knee Extension: 3+/5 Left Ankle Dorsiflexion: 5/5  Exercise/Treatments    Seated Long Arc Quad: 10 reps Supine Quad Sets: 10 reps Straight Leg Raises: 10 reps Sidelying Hip ABduction: 10 reps Prone  Hip Extension: 10 reps    Physical Therapy Assessment and Plan PT Assessment and Plan Clinical Impression Statement: Pt is a 68 yo female with chronic hx of knee pain who has had and exacerbation of her her pain.  She has been referred to [facility] for a HEP  PT Plan: D/C to [facility] Pt/caregiver will Perform Home Exercise Program: For increased strengthening  Problem List Patient Active Problem List   Diagnosis Date Noted  . Weakness of left leg 08/18/2013  . Leg weakness 08/18/2013  .  Primary osteoarthritis of left knee 08/04/2013  . Pleurisy, right side 03/18/2012  . Community acquired pneumonia, left lower lobe 02/26/2012 02/26/2012  . Lung nodules 02/10/2012  . Asthmatic bronchitis 06/04/2011  . Chronic cough 05/18/2011  . Recurrent pneumonia 05/18/2011  . CAP (community acquired pneumonia) 05/02/2011  . Hypokalemia 05/02/2011  . Cough 05/01/2011  . Hypertension 05/01/2011  . Hypothyroidism 05/01/2011  . Obesity 05/01/2011  . GERD (gastroesophageal reflux disease) 05/01/2011  . Hyperlipidemia 05/01/2011  . LPRD (laryngopharyngeal reflux disease) 11/21/2010  . Dysphagia  11/21/2010  . Loose body in joint 07/25/2010  . Arthritis of knee 07/25/2010  . TEAR MEDIAL MENISCUS 04/10/2010    PT Plan of Care PT Home Exercise Plan: given  GP Functional Assessment Tool Used: foto Functional Limitation: Mobility: Walking and moving around Mobility: Walking and Moving Around Current Status (A2130): At least 60 percent but less than 80 percent impaired, limited or restricted Mobility: Walking and Moving Around Goal Status [phone]): At least 60 percent but less than 80 percent impaired, limited or restricted Mobility: Walking and Moving Around Discharge Status [phone]): At least 60 percent but less than 80 percent impaired, limited or restricted  RUSSELL,CINDY 08/18/2013, 5:20 PM  Physician Documentation Your signature is required to indicate approval of the treatment plan as stated above.  Please sign and either send electronically or make a copy of this report for your files and return this physician signed original.   Please mark one 1.__approve of plan  2. ___approve of plan with the following conditions.   ______________________________                                                          _____________________ Physician Signature                                                                                                             Date SBNR

## 2013-09-12 NOTE — Addendum Note (Signed)
Encounter addended by: Debby Bud, OT on: 09/12/2013  1:31 PM<BR>     Documentation filed: Episodes

## 2013-09-21 ENCOUNTER — Encounter: Payer: Self-pay | Admitting: Orthopedic Surgery

## 2013-09-21 ENCOUNTER — Ambulatory Visit (INDEPENDENT_AMBULATORY_CARE_PROVIDER_SITE_OTHER): Payer: Commercial Managed Care - HMO | Admitting: Orthopedic Surgery

## 2013-09-21 VITALS — BP 171/89 | Ht 62.0 in | Wt 196.0 lb

## 2013-09-21 DIAGNOSIS — M1710 Unilateral primary osteoarthritis, unspecified knee: Secondary | ICD-10-CM

## 2013-09-21 DIAGNOSIS — M1712 Unilateral primary osteoarthritis, left knee: Secondary | ICD-10-CM

## 2013-09-21 NOTE — Progress Notes (Signed)
Chief Complaint  Patient presents with  . Follow-up    6 week recheck Left knee s/p brace   Reevaluation 68 year old female multiple left knee arthroscopies injured her left knee we treated her with a motor brace and therapy. She says her knee feels good she doesn't have to use her cane as much anymore and she is doing fine. She denies catching locking giving way. She is ambulatory today she is a cane for some support. She has no pain swelling or tenderness in her knee her knee flexion is excellent at 125 and he feels stable her motor exam is normal her meniscal signs are negative skin is intact is good distal pulse.  Followup as needed diagnosis left knee osteoarthritis

## 2013-09-22 ENCOUNTER — Ambulatory Visit: Payer: Commercial Managed Care - HMO | Admitting: Orthopedic Surgery

## 2014-01-05 ENCOUNTER — Other Ambulatory Visit (HOSPITAL_COMMUNITY): Payer: Self-pay | Admitting: Internal Medicine

## 2014-01-05 DIAGNOSIS — R220 Localized swelling, mass and lump, head: Secondary | ICD-10-CM

## 2014-01-05 DIAGNOSIS — D47Z2 Castleman disease: Secondary | ICD-10-CM

## 2014-01-05 DIAGNOSIS — R221 Localized swelling, mass and lump, neck: Principal | ICD-10-CM

## 2014-01-08 ENCOUNTER — Emergency Department (HOSPITAL_COMMUNITY): Payer: Commercial Managed Care - HMO

## 2014-01-08 ENCOUNTER — Emergency Department (HOSPITAL_COMMUNITY)
Admission: EM | Admit: 2014-01-08 | Discharge: 2014-01-08 | Disposition: A | Discharge status: Home / Self Care | Payer: Commercial Managed Care - HMO | Attending: Emergency Medicine | Admitting: Emergency Medicine

## 2014-01-08 ENCOUNTER — Encounter (HOSPITAL_COMMUNITY): Payer: Self-pay | Admitting: Emergency Medicine

## 2014-01-08 DIAGNOSIS — Z86018 Personal history of other benign neoplasm: Secondary | ICD-10-CM | POA: Diagnosis not present

## 2014-01-08 DIAGNOSIS — G8929 Other chronic pain: Secondary | ICD-10-CM | POA: Insufficient documentation

## 2014-01-08 DIAGNOSIS — F419 Anxiety disorder, unspecified: Secondary | ICD-10-CM | POA: Diagnosis not present

## 2014-01-08 DIAGNOSIS — Z7951 Long term (current) use of inhaled steroids: Secondary | ICD-10-CM | POA: Diagnosis not present

## 2014-01-08 DIAGNOSIS — Z791 Long term (current) use of non-steroidal anti-inflammatories (NSAID): Secondary | ICD-10-CM | POA: Diagnosis not present

## 2014-01-08 DIAGNOSIS — Y998 Other external cause status: Secondary | ICD-10-CM | POA: Diagnosis not present

## 2014-01-08 DIAGNOSIS — Z880 Allergy status to penicillin: Secondary | ICD-10-CM | POA: Diagnosis not present

## 2014-01-08 DIAGNOSIS — E780 Pure hypercholesterolemia: Secondary | ICD-10-CM | POA: Diagnosis not present

## 2014-01-08 DIAGNOSIS — J449 Chronic obstructive pulmonary disease, unspecified: Secondary | ICD-10-CM | POA: Insufficient documentation

## 2014-01-08 DIAGNOSIS — Z87738 Personal history of other specified (corrected) congenital malformations of digestive system: Secondary | ICD-10-CM | POA: Insufficient documentation

## 2014-01-08 DIAGNOSIS — T380X5A Adverse effect of glucocorticoids and synthetic analogues, initial encounter: Secondary | ICD-10-CM | POA: Diagnosis not present

## 2014-01-08 DIAGNOSIS — M1990 Unspecified osteoarthritis, unspecified site: Secondary | ICD-10-CM | POA: Insufficient documentation

## 2014-01-08 DIAGNOSIS — Z8619 Personal history of other infectious and parasitic diseases: Secondary | ICD-10-CM | POA: Insufficient documentation

## 2014-01-08 DIAGNOSIS — Z8701 Personal history of pneumonia (recurrent): Secondary | ICD-10-CM | POA: Diagnosis not present

## 2014-01-08 DIAGNOSIS — Y9289 Other specified places as the place of occurrence of the external cause: Secondary | ICD-10-CM | POA: Insufficient documentation

## 2014-01-08 DIAGNOSIS — X58XXXA Exposure to other specified factors, initial encounter: Secondary | ICD-10-CM | POA: Insufficient documentation

## 2014-01-08 DIAGNOSIS — E099 Drug or chemical induced diabetes mellitus without complications: Secondary | ICD-10-CM | POA: Diagnosis not present

## 2014-01-08 DIAGNOSIS — S60222A Contusion of left hand, initial encounter: Secondary | ICD-10-CM | POA: Diagnosis not present

## 2014-01-08 DIAGNOSIS — Z79899 Other long term (current) drug therapy: Secondary | ICD-10-CM | POA: Diagnosis not present

## 2014-01-08 DIAGNOSIS — S6992XA Unspecified injury of left wrist, hand and finger(s), initial encounter: Secondary | ICD-10-CM

## 2014-01-08 DIAGNOSIS — Y9389 Activity, other specified: Secondary | ICD-10-CM | POA: Insufficient documentation

## 2014-01-08 DIAGNOSIS — M479 Spondylosis, unspecified: Secondary | ICD-10-CM | POA: Insufficient documentation

## 2014-01-08 DIAGNOSIS — Z7952 Long term (current) use of systemic steroids: Secondary | ICD-10-CM | POA: Insufficient documentation

## 2014-01-08 DIAGNOSIS — K219 Gastro-esophageal reflux disease without esophagitis: Secondary | ICD-10-CM | POA: Diagnosis not present

## 2014-01-08 DIAGNOSIS — E039 Hypothyroidism, unspecified: Secondary | ICD-10-CM | POA: Insufficient documentation

## 2014-01-08 NOTE — ED Provider Notes (Signed)
CSN: [id number]     Arrival date & time 01/08/14  1351 History  This chart was scribed for non-physician practitioner, Debroah Baller, PA-C,working with Leota Jacobsen, MD, by Marlowe Kays, ED Scribe. This patient was seen in room APFT21/AP[facility]21 and the patient's care was started at 3:15 PM.  Chief Complaint  Patient presents with  . Hand Pain   Patient is a 68 y.o. female presenting with hand pain. The history is provided by the patient. No language interpreter was used.  Hand Pain    HPI Comments:  Krystal Powell is a 68 y.o. female who presents to the Emergency Department complaining of severe left hand pain that began two days ago. Pt states she was getting into her bed and lifted herself up with her hands with the knuckles of her fists placed downward in the bed. She reports immediate severe pain at the dorsum of the left hand with some mild bruising. She states she has taken Tylenol for the pain. Denies alleviating factors. Denies numbness, tingling or weakness of the left hand. PMH of COPD, HTN, asthma, HLD, hypothyroidism, anxiety, arthritis, DM, DDD, fibromyalgia and IBS.  Past Medical History  Diagnosis Date  . COPD (chronic obstructive pulmonary disease)   . Hypertension   . Asthma   . GERD (gastroesophageal reflux disease)   . Hiatal hernia   . Schatzki's ring   . Adenomatous polyps     Dr Arlana Pouch  . Spondylosis   . Hypercholesteremia   . Anxiety   . Hypothyroidism   . PONV (postoperative nausea and vomiting)   . Degenerative disc disease   . Chronic back pain   . Larynx disease     pt's left side if stiff and has protein deposits on it which causes her to lose air on that side and can't talk as well due to it  . Arthritis   . DM (diabetes mellitus)     when on prednisone  . Status post dilation of esophageal narrowing   . Fibromyalgia   . History of gallstones   . IBS (irritable bowel syndrome)   . History of pneumonia   . Shingles    Past Surgical History   Procedure Laterality Date  . Knee surgery      multiple knee arthroscopies  . Tonsillectomy and adenoidectomy    . Bladder suspension    . Partial hysterectomy    . Appendectomy    . Cholecystectomy    . Umbilical hernia repair    . Laparoscopy    . Carotid endarterectomy      right  . Sinus exploration      x 2  . Shoulder surgery      right  . Foot surgery      right  . Maloney dilation  12/18/2010    Procedure: Venia Minks DILATION;  Surgeon: Daneil Dolin, MD;  Location: [facility];  Service: Endoscopy;  Laterality: N/A;  #56 dilator  . Abdominal hysterectomy      partial  . Tubal ligation    . Cataract extraction w/phaco  03/24/2011    Procedure: CATARACT EXTRACTION PHACO AND INTRAOCULAR LENS PLACEMENT (IOC);  Surgeon: Tonny Branch, MD;  Location: [facility];  Service: Ophthalmology;  Laterality: Left;  CDE 15.27  . Cataract extraction w/phaco  04/03/2011    Procedure: CATARACT EXTRACTION PHACO AND INTRAOCULAR LENS PLACEMENT (IOC);  Surgeon: Tonny Branch, MD;  Location: [facility];  Service: Ophthalmology;  Laterality: Right;  CDE:7.41  . Breast excisional biopsy  left, x 3-4  . Breast excisional biopsy      right  . Larynx surgery      polyp removed  . Esophageal manometry  06/09/2011    Procedure: ESOPHAGEAL MANOMETRY (EM);  Surgeon: Jerene Bears, MD;  Location: [facility];  Service: Gastroenterology;  Laterality: N/A;  . Lumbar laminectomy/decompression microdiscectomy  01/19/2012    Procedure: LUMBAR LAMINECTOMY/DECOMPRESSION MICRODISCECTOMY 1 LEVEL;  Surgeon: Winfield Cunas, MD;  Location: [facility];  Service: Neurosurgery;  Laterality: Left;  LEFT Lumbar three-four diskectomy   Family History  Problem Relation Age of Onset  . GER disease Mother   . Goiter Mother     malignant  . GER disease Father   . Anesthesia problems Neg Hx   . Malignant hyperthermia Neg Hx   . Pseudochol deficiency Neg Hx   . Colon polyps Daughter   . Colon polyps Son   . Diabetes Sister   .  Diabetes Brother   . Heart disease Mother   . Heart disease Father   . Heart disease Brother   . Irritable bowel syndrome Mother    History  Substance Use Topics  . Smoking status: Never Smoker   . Smokeless tobacco: Never Used  . Alcohol Use: No   OB History    Gravida Para Term Preterm AB TAB SAB Ectopic Multiple Living   3 3  3      3      Review of Systems  Musculoskeletal: Positive for arthralgias.  All other systems reviewed and are negative.   Allergies  Sulfonamide derivatives; Adhesive; Amoxicillin-pot clavulanate; Avelox; Demeclocycline hcl; Doxycycline; Iodinated diagnostic agents; Iodine; Iohexol; Thorazine; Codeine; and Erythromycin  Home Medications   Prior to Admission medications   Medication Sig Start Date End Date Taking? Authorizing Provider  acetaminophen (TYLENOL) 500 MG tablet Take 1,000 mg by mouth 2 (two) times daily.      Historical Provider, MD  albuterol (PROVENTIL HFA;VENTOLIN HFA) 108 (90 BASE) MCG/ACT inhaler Inhale 2 puffs into the lungs every 6 (six) hours as needed. For shortness of breath     Historical Provider, MD  ALPRAZolam Duanne Moron) 0.5 MG tablet Take 0.5 mg by mouth at bedtime.     Historical Provider, MD  azelastine (ASTELIN) 137 MCG/SPRAY nasal spray Place 1 spray into the nose 2 (two) times daily. Use in each nostril as directed    Historical Provider, MD  celecoxib (CELEBREX) 200 MG capsule Take 200 mg by mouth daily.    Historical Provider, MD  cetirizine (ZYRTEC) 10 MG tablet Take 10 mg by mouth at bedtime.     Historical Provider, MD  Cholecalciferol (VITAMIN D) 2000 UNITS tablet Take 2,000 Units by mouth every morning.      Historical Provider, MD  Cyanocobalamin 2500 MCG TABS Take 1 tablet by mouth every morning.     Historical Provider, MD  cyclobenzaprine (FLEXERIL) 10 MG tablet Take 10 mg by mouth 3 (three) times daily as needed for muscle spasms.    Historical Provider, MD  diazepam (VALIUM) 5 MG tablet Take one tablet every six  hours as needed for dizziness. 02/25/13   Veryl Speak, MD  diltiazem (CARDIZEM) 120 MG tablet Take 120 mg by mouth daily.    Historical Provider, MD  diphenhydrAMINE (BENADRYL) 25 MG tablet Take 25 mg by mouth at bedtime.    Historical Provider, MD  fluticasone (FLONASE) 50 MCG/ACT nasal spray Place 1 spray into the nose 2 (two) times daily.    Historical Provider,  MD  gabapentin (NEURONTIN) 300 MG capsule Take 1 capsule (300 mg total) by mouth 2 (two) times daily. 11/11/12   Brand Males, MD  ipratropium-albuterol (DUONEB) 0.5-2.5 (3) MG/3ML SOLN Take 3 mLs by nebulization 4 (four) times daily as needed. For shortness of breath    Historical Provider, MD  levothyroxine (SYNTHROID, LEVOTHROID) 100 MCG tablet Take 100 mcg by mouth every morning.  04/23/10   Historical Provider, MD  meclizine (ANTIVERT) 25 MG tablet Take 50 mg by mouth 4 (four) times daily. For dizziness    Historical Provider, MD  metFORMIN (GLUCOPHAGE) 500 MG tablet Take 500 mg by mouth daily with breakfast.    Historical Provider, MD  mometasone-formoterol (DULERA) 100-5 MCG/ACT AERO Inhale 2 puffs into the lungs 2 (two) times daily.    Historical Provider, MD  naproxen sodium (ANAPROX) 220 MG tablet Take 220 mg by mouth daily as needed.    Historical Provider, MD  Omega-3 Fatty Acids (FISH OIL) 1000 MG CAPS Take 1 capsule by mouth every morning.     Historical Provider, MD  pantoprazole (PROTONIX) 40 MG tablet Take 20 mg by mouth 2 (two) times daily. With breakfast and supper 05/27/11 08/16/13  Jerene Bears, MD  potassium chloride (K-DUR,KLOR-CON) 10 MEQ tablet Take 10 mEq by mouth every morning.  04/02/10   Historical Provider, MD  pravastatin (PRAVACHOL) 20 MG tablet Take 20 mg by mouth at bedtime.     Historical Provider, MD  ranitidine (ZANTAC) 150 MG tablet Take 150 mg by mouth at bedtime.    Historical Provider, MD  Red Yeast Rice 600 MG CAPS Take 1 capsule by mouth every morning.      Historical Provider, MD  SINGULAIR 10 MG  tablet Take 10 mg by mouth at bedtime.  04/22/10   Historical Provider, MD  trimethoprim (TRIMPEX) 100 MG tablet Take 100 mg by mouth at bedtime.    Historical Provider, MD  vitamin E 1000 UNIT capsule Take 1,000 Units by mouth every morning.      Historical Provider, MD   Triage Vitals: BP 156/58 mmHg  Pulse 82  Temp(Src) 97.5 F (36.4 C) (Oral)  Resp 15  Ht 5\' 2"  (1.575 m)  Wt 189 lb (85.73 kg)  BMI 34.56 kg/m2  SpO2 98% Physical Exam  Constitutional: She is oriented to person, place, and time. She appears well-developed and well-nourished.  HENT:  Head: Normocephalic.  Eyes: EOM are normal.  Neck: Neck supple.  Cardiovascular: Normal rate.   Strong radial pulse. Adequate circulation.  Pulmonary/Chest: Effort normal.  Abdominal: Soft. There is no tenderness.  Musculoskeletal: Normal range of motion.       Right hand: She exhibits tenderness and swelling. She exhibits normal capillary refill, no deformity and no laceration. Normal strength noted.       Hands: Tenderness to palpation to the dorsum of left hand at base of third digit. Mild ecchymosis noted.  Neurological: She is alert and oriented to person, place, and time. She has normal strength. No cranial nerve deficit or sensory deficit.  Skin: Skin is warm and dry.  Psychiatric: She has a normal mood and affect. Her behavior is normal.  Nursing note and vitals reviewed.   ED Course  Procedures (including critical care time) DIAGNOSTIC STUDIES: Oxygen Saturation is 98% on RA, normal by my interpretation.   COORDINATION OF CARE: 3:18 PM- Will X-Ray left hand. Pt verbalizes understanding and agrees to plan.  Medications - No data to display  Labs Review Labs Reviewed -  No data to display  Imaging Review Dg Hand Complete Left  01/08/2014   CLINICAL DATA:  Left hand pain, initial encounter  EXAM: LEFT HAND - COMPLETE 3+ VIEW  COMPARISON:  None.  FINDINGS: Mild degenerative changes are noted in the interphalangeal  joints particularly of the second and fifth digits. Degenerative changes are noted at the first MCP joint. A small bony density is noted adjacent to the trapezium which could represent a small avulsion fracture no other findings to suggest acute fracture are noted.  IMPRESSION: Possible small avulsion fracture from the trapezium. No other acute abnormality is seen.   Electronically Signed   By: Inez Catalina M.D.   On: 01/08/2014 15:45    MDM  68 y.o. female with injury to left hand 2 nights ago. Splint applied, ice elevation and follow up with PCP. Stable for discharge without neurovascular deficits. I have reviewed this patient's vital signs, nurses notes, appropriate labs and imaging.  I have discussed findings with the patient and plan of care. She voices understanding and agrees with plan. She has pain medication at home.  Final diagnoses:  Hand injury, left, initial encounter    I personally performed the services described in this documentation, which was scribed in my presence. The recorded information has been reviewed and is accurate.    Rosa, NP 01/08/14 1647  Leota Jacobsen, MD 01/08/14 [phone]

## 2014-01-08 NOTE — ED Notes (Signed)
Patient c/o left hand pain. Per patient went to stand last night from bed "using fist on the bed to stand up." Patient felt pop in left hand with sevre pain. Patient reports swelling. Per patient used ice with no relief.

## 2014-01-08 NOTE — ED Notes (Signed)
Pt in xray

## 2014-01-09 ENCOUNTER — Ambulatory Visit (HOSPITAL_COMMUNITY)
Admission: RE | Admit: 2014-01-09 | Discharge: 2014-01-09 | Disposition: A | Discharge status: Home / Self Care | Payer: Commercial Managed Care - HMO | Source: Ambulatory Visit | Attending: Internal Medicine | Admitting: Internal Medicine

## 2014-01-09 ENCOUNTER — Other Ambulatory Visit (HOSPITAL_COMMUNITY): Payer: Self-pay | Admitting: Internal Medicine

## 2014-01-09 DIAGNOSIS — K118 Other diseases of salivary glands: Secondary | ICD-10-CM | POA: Diagnosis not present

## 2014-01-09 DIAGNOSIS — R220 Localized swelling, mass and lump, head: Secondary | ICD-10-CM

## 2014-01-09 DIAGNOSIS — R221 Localized swelling, mass and lump, neck: Principal | ICD-10-CM

## 2014-01-09 DIAGNOSIS — R930 Abnormal findings on diagnostic imaging of skull and head, not elsewhere classified: Secondary | ICD-10-CM | POA: Insufficient documentation

## 2014-01-09 DIAGNOSIS — D1801 Hemangioma of skin and subcutaneous tissue: Secondary | ICD-10-CM | POA: Insufficient documentation

## 2014-01-09 DIAGNOSIS — I6782 Cerebral ischemia: Secondary | ICD-10-CM | POA: Insufficient documentation

## 2014-01-09 DIAGNOSIS — D47Z2 Castleman disease: Secondary | ICD-10-CM

## 2014-01-09 DIAGNOSIS — M4802 Spinal stenosis, cervical region: Secondary | ICD-10-CM | POA: Insufficient documentation

## 2014-01-09 MED ORDER — GADOBENATE DIMEGLUMINE 529 MG/ML IV SOLN
8.0000 mL | Freq: Once | INTRAVENOUS | Status: AC | PRN
  Administered 2014-01-09: 8 mL via INTRAVENOUS

## 2014-03-27 ENCOUNTER — Ambulatory Visit (INDEPENDENT_AMBULATORY_CARE_PROVIDER_SITE_OTHER): Payer: Commercial Managed Care - HMO | Admitting: Internal Medicine

## 2014-03-27 ENCOUNTER — Encounter (INDEPENDENT_AMBULATORY_CARE_PROVIDER_SITE_OTHER): Payer: Self-pay

## 2014-03-27 ENCOUNTER — Telehealth: Payer: Self-pay | Admitting: Internal Medicine

## 2014-03-27 ENCOUNTER — Encounter: Payer: Self-pay | Admitting: Internal Medicine

## 2014-03-27 VITALS — BP 130/70 | HR 90 | Temp 98.6°F

## 2014-03-27 DIAGNOSIS — J4540 Moderate persistent asthma, uncomplicated: Secondary | ICD-10-CM

## 2014-03-27 MED ORDER — FLUTTER DEVI: Status: AC

## 2014-03-27 MED ORDER — PREDNISONE 10 MG PO TABS: ORAL_TABLET | ORAL | Status: DC

## 2014-03-27 MED ORDER — LEVOFLOXACIN 500 MG PO TABS: 500.0000 mg | ORAL_TABLET | Freq: Every day | ORAL | Status: DC

## 2014-03-27 NOTE — Assessment & Plan Note (Addendum)
DDX of  difficult airways management all start with A and  include Adherence, Ace Inhibitors, Acid Reflux, Active Sinus Disease, Alpha 1 Antitripsin deficiency, Anxiety masquerading as Airways dz,  ABPA,  allergy(esp in young), Aspiration (esp in elderly), Adverse effects of DPI,  Active smokers, plus two Bs  = Bronchiectasis and Beta blocker use..and one C= CHF  Adherence is always the initial "prime suspect" and is a multilayered concern that requires a "trust but verify" approach in every patient - starting with knowing how to use medications, especially inhalers, correctly, keeping up with refills and understanding the fundamental difference between maintenance and prns vs those medications only taken for a very short course and then stopped and not refilled.  The proper method of use, as well as anticipated side effects, of a metered-dose inhaler are discussed and demonstrated to the patient. Improved effectiveness after extensive coaching during this visit to a level of approximately  90% so ok to continue dulera and qvar but once better should not need daily nebs and I suspect she has a degree of tachyphylaxis from chronic overuse  ? Acid (or non-acid) GERD > always difficult to exclude as up to 75% of pts in some series report no assoc GI/ Heartburn symptoms> rec continue max (24h)  acid suppression and diet restrictions/ reviewed     ? Allergy > Prednisone 10 mg take  4 each am x 2 days,   2 each am x 2 days,  1 each am x 2 days and stop   ? Active sinus dz > consider sinus ct next, give 10 more days levaquin 500 mg daily in meantime as allergic to most other meds   Will add flutter on a trial basis / f/u with Dr Chase Caller if not able to reconnect with [facility] through insurance

## 2014-03-27 NOTE — Progress Notes (Signed)
Subjective:    Patient ID: Krystal Powell, female    DOB: 12-19-1945, 69 y.o.   MRN: [id number]    Brief patient profile:   19 yowf never smoker seen for initial pulmonary consult 05/16/11 for chronic cough and recurrent lung infection x 20 months. PCP is HALL,ZACK, MD   History of Present Illness  05/16/11 Consult for chronic cough Reports recurrent lung infection x 20 months. Reports episodes of fever, green mucus, cough, wheezing, weakness. Rx with antibiotics and prednsione that helps temporarily but recurs after a few days of completing course. Even between episodes does not feel fully fine; still has some cough. Only 1 admission for this; 05/01/11 - 05/04/11 and CXR showed LLL consolidation. Never smoked though parents smoked as child. Gives hx of GERD and gives hx of esophageal dilatation 5-6 times (Dr Sydell Axon GI in [place]) and apparently refractory. Still with dysphagia - needs to take liquid to swallow food and has difficulty with large pills (cuts antibiotics into 4 pieces). She herself suspects she is aspirating food into lungs (gives hx of several episodes of coughing up food especially in last 20 months).   Overall main issue appears to be chronic daily cough. RSI cough score is 38 out of 40 and very c/w LPR cough. Describes Level 5 - post nasal drip, difficulty swallowing, breathing difficulty/choking episodies, annoying cough, sensation of something in throat, and hearburn. Level 4 - hoarseness of voice or cough after lying down  Recollects hx of PFT MArch 2012 in [place] that reportedly showed copd. 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 Lived in [place] for 15 years for asthma. REcently saw DR Ishmael Holter allergist who she says was nervous to do allergy tests due to pulmonary hx.. Hx of sinus surger in [place] over 20 years ago. Saw DR Benjamine Mola in [place] [facility] last year. Reports polyps and constant sinus drainage.  REcollects vocal  cord nodule ablation by Dr Addison Bailey [facility] in [place] 2013  Hx of allergy testing in approx 2011 by Dr Leonard Downing Ishmael Holter - positive sking test. Was told "breathing too poor to give allergy shots". Says around that time went on O2 Rx due to a "blown up lung situation" but this resolved in a year  06/04/2011 Acute OV c/o chillis, fever, congestion, cough with green mucus, runny nose, and pain in her lungs for last 4 days.  Lots of nasal drainage. Increased wheezing. OTC not helping. Feels bad with no energy.  Has CT chest planned next week. No hemoptysis, chest pain , no edema. Last ABX 1 month ago.   REC Hold fish Oil for 1 month then place in freezer  Omnicef 300 mg twice daily for 7 days, take with food.  Mucinex DM twice daily as needed for cough and congestion.  Saline nasal rinses as needed.  Fluids and rest.  Prednisone taper for the next week.  Followup as planned for CT scan next week.  follow up Dr. Chase Caller in 2 weeks as planned  Please contact office for sooner follow up if symptoms do not improve or worsen or seek emergency care    OV 06/17/2011 [place] chronic cough   Cough persists unchanged and is severe and associated with mild on and off green sputum.  She did see NP earlier this month for acute bronchitis episode with wheizng and given omnicef and prednisone burst and was asked to stopp fish oil. Current RSI cough  score is 37 and unchanged and c/w LPR cough.  Kouffman cough differentiator - she scored 5 for GE reflux and 4 for neurogenic cough suggesting  bouth strong ge reflux and neurogenic components for cough. CT chest 06/06/11 - esssentially clear to my review exceot for scattered nodules. She has see Dr Hilarie Fredrickson of GI and per her hx had MBSS and esophageal manometry and both reportedly normal. FU and discussion about nissen fundoplication pending.   REviewed meds and noted she is on, flonase, zyrtec, benadryl, duoneb, dulera, singulair, protonix and znatac. Of note, she is on  non specific beta blocker coreg through there is no hx of chf     CT CHEST 06/06/11 Comparison: None  Correlation: Chest radiograph 05/01/2011  Findings:  Gallbladder surgically absent.  Remaining visualized portions of upper abdomen normal appearance.  Minimal atherosclerotic calcification.  Asymmetric density upper left breast 1.4 x 1.5 cm image 2.  Surgical clips left axilla.  No definite thoracic adenopathy.  Tracheobronchial cartilage calcification noted.  Atelectasis versus scarring in medial right upper lobe images 22-  27.  Additional atelectasis versus scarring in medial left lower lobe,  somewhat more focally dense.  Numerous tiny nodular foci noted throughout both lungs, some poorly  defined, measuring up to 5 mm diameter.  These are nonspecific in appearance of uncertain etiology; none are  definitively calcified to suggest old granulomatous disease.  No infiltrate, pleural effusion or pneumothorax.  Scattered end plate spur formation thoracic spine.  IMPRESSION:  Scattered areas of atelectasis versus scarring medial right upper  lobe and medial left lower lobe, slightly more prominent in left  lower lobe.  Numerous tiny noncalcified nodular foci throughout both lungs up to  5 mm greatest size, nonspecific.  If the patient is at high risk for bronchogenic carcinoma, follow-  up chest CT at 6-12 months is recommended. If the patient is at  low risk for bronchogenic carcinoma, follow-up chest CT at 12  months is recommended. This recommendation follows the consensus  statement: Guidelines for Management of Small Pulmonary Nodules  Detected on CT Scans: A Statement from the [employer] as  published in Radiology 2005; 237:395-400.  0.  Original Report Authenticated By: Burnetta Sabin, M.D.     [facility] It appears cough is due to acid reflux, neurogenic cough and maybe asthma but we need to rule out asthma component because the inhalers themselves could make cough  worse  Hold dulera for 2 weeks but if you get worse call us immediately  Then have methacholine challenge test  Call for me to review those results  Further plan based on those results  Update 10:33 PM on 06/17/2011  - will cal PMD annd have him dc coreg in case there is asthma and this non specific beta blocker is making it worse  OV 06/25/2011 - ACUTE VISIT Cough worse after stopping dulera while waiting for methacholine challenge. Worsening started 4 days after coming off dulera. Reports worsening cough that is dry, dyspnea, wheeze but no sputum. She saw GI yesterday and they are recommending current line of GERD mgmt with option of nissen fundoplication in future. Today RSI cough score is 34 (note she reports cough is worse but score is better/similar to last ov) and on cough differentiator she scored 4 out of 5 for GERD and 5 out of 5 for neurogenic cough. Spirometry today sows moderate obstuction  -fev1 1.38L/64%, Ratio 69. FVC 1.99L/73%, small airway 43%  Of note, she is now off coreg and  off fish oil >>restart Dulera   12/29/2011 Acute OV  Complains of prod cough with green mucus, hoarseness, rattling in chest, increased SOB, chills/sweats x6 days.  Finished a pred pak yesterday that was left over.  Did not help. Still coughing up blood.  No chest pain, edema or fever.  otc cough meds not working.    Omnicef 300 mg twice daily for 10 days, take with food.  Mucinex DM twice daily as needed for cough and congestion.  Saline nasal rinses as needed.  Fluids and rest.  Prednisone taper for the next week.  Follow up Dr. Chase Caller 6 weeks and As needed  Please contact office for sooner follow up if symptoms do not improve or worsen or seek emergency care    OV 02/09/2012  Overall cough better but still with significnat cough that is burdensome. Says she has a lot of kleenex due to cough. Cough is associtaed with green sputum and significant sinus drainage. For sinus: she uses nasal  steroid, saline spray. Unable to do netti pot due to flow into ear. For allergies: sees Dr Vashti Hey. Says she is not eligible for allergy shots due to "lung issue". Apparently Dr Ishmael Holter told her, " you do not breathe welll enough for you to get allergy shots"/. For sinus: also sees Dr Yves Dill who apparentlyu has told her "you have congestion in your sinus". She gives hx feb 2013 of polyp removal from larynx at [facility]. For lungs: uses dulera. For GERD: protonix bid and zantac qhs. Back on fish oil due to resurgence of cholesteroil. Going back has not made cough worse.  In terms of BP: she is back on coreg because amlodipone made her tachy. RSI cough score improved from 37 to 27 but still because greater than 15 reflects LPR cough.      #Cough  - your cough will not go away as long as the irritants are there but we can work towards getting rid of it  - #for sinus  - do all the things you are doing but add neil med hypertonic saline spray 2 times each nostril at night  - will hold off antibiotics due to multiple allergies, chronic nature and lack of toxicity-  #acid reflux  -continue as before  #asthma  - continue dulera  #BP  - need to get off coreg; talk to PMD. THere is no way with asthma you can be on this medication  #Irritable larynx  - Take gabapentin 300mg  once daily x 3 days, then 300mg  twice daily x 3 days, then 300mg  three times daily to continue. If this makes you too sleepy or drowsy call us and we will cut your medication dosing down  - refer Mr Valentino Saxon speech therapy  - at times you have urge to cough, drink water or chew on sugarless lozenge  #Acid REflux  - per [facility]  #Lung nodule  - CT chest may 2014 followu[  #REturn  - 6 weeks to report progress  - cough score at followup   OV 02/26/2012 acute visit for patient  Krystal Powell  After her last visit in starting Neurontin her cough has resolved as indicated by her RSI score of 3 compared to 27 in the past. However on  February 24 2012 2 days ago, she developed acute onset fever with chills at night. This was associated with feeling of exhaustion and chills and right worse. The next morning which was yesterday she developed acute onset moderate pleuritic pain of  the chest on the left infra-axillary area. As stated before there is no increase in cough in fact cough is better but she is having some green sputum that is more than baseline. She denies any associated hemoptysis, edema. She generally feels worn out. There is no nausea or vomiting. Of note, she has had a flu shot this season. Is no increased shortness of breath or wheezing     LABS Urine streptococcal antigen negative April 2013  D-dimer slightly high at 3   Dg Chest 2 View  02/26/2012  *RADIOLOGY REPORT*  Clinical Data: Fever.  Pleuritic chest pain.  CHEST - 2 VIEW  Comparison: Two-view chest 01/15/2012.  Findings: Cardiac enlargement is stable.  Emphysematous changes are again noted.  A left pleural effusion and basilar airspace disease is noted.  Nodular densities are seen in the posterior mediastinum on the lateral view.  No definite disease is evident on the PA view. However, this is in the region of previous nodular disease on the CT scan.  The visualized soft tissues and bony thorax are unremarkable.  IMPRESSION:  1.  New left pleural effusion and basilar airspace disease.  While this may represent atelectasis, infection is not excluded. 2.  Nodular densities on the lateral view.  This could represent progression of pulmonary nodules.  Recommend CT of the chest without contrast for further evaluation.   Original Report Authenticated By: San Morelle, M.D.     [facility] You likely have a pneumonia  Not sure if you have influenza despite getting flu shot  Give blood for blood test and do a chest x-ray; if these are normal will have her call you and do a CT scan or another test of the chest to look for blood clot in the lung  You absolutely need  antibiotics despite multiple allergies to multiple antibiotics  - take levaquin 500mg  once daily X 7 days (extended duration based on your request]  - I do note that you have allergy to Avelox in terms of nausea and vomiting but because if taken ciprofloxacin in the past without any problems we're going to go ahead with Levaquin  Hold nitrofurantoin while taking Levaquin  Also take Tamiflu 75 mg twice a day for 5 days just in case you have influenza; please note this can make him nauseated and if so please call us and we can stop it  Return to prior visit unless otherwise stated  OV 03/18/2012  Followup for chronic cough and also recent pneumonia end January 2014  - Chronic cough is almost resolved. She says gabapentin was the best drug for  Cough. Currently RSI score is 5 and a similar to last visit. She still has some mild amount of green sputum which she says is similar to baseline. She thinks this is from postnasal drip. She says is compliant with sinus, acid reflux, asthma and irritable larynx treatment. She's completed speech therapy and is being discharged  -. In terms of pneumonia last month: The antibiotic Levaquin work well without any side effects. Also acute symptomatology of fever chills left pleuritic chest pain have all resolved. However 3 days into treatment for pneumonia she developed acute pleuritic chest pain on the contralateral side which is the right side. Apparently this pain was moderate in intensity and lasted for 3 days and resolve spontaneously. There is no associated edema or hemoptysis at this time. Of note, at the time of diagnosis of left lower lobe pneumonia with pleural effusion her d-dimer was slightly high  at 3 and but we deferred pulmonary embolism workup  REC #Cough  - continue all measures as before  - glad you saw Mr Valentino Saxon  - instead of coreg, talk to [facility], MD and change to generic Bisoprolol  #Pneumonia - left side  - clinically resolved.  - HAve  CXR to ensure radiologic clearance  #Right pleurisy  - have d-dimer blood test, if elevated will order scan test for blood clot   OV 08/16/2012  Followup for chronic cough. Overall cough is improved although the cough score is slightly worse. 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 She feels this is due to allergies. She is open to seeing an allergist. Details of cough presented in the table below  #Cough  - continue all measures as before including neurontin       OV 01/03/2013  Chief Complaint  Patient presents with  . Follow-up    Pt states she has PND that will cause her to cough some during the day.  Pt states cough is much improved.    Followup chronic cough. Overall cough is stable   Some mild worsening this cough after lying down dysphagia and choking episodes which she feels is related to the recent respiratory infection a month ago. Past medical history reviewed: She did see Dr. Harold Hedge at [facility] allergy and has been found to have insufficient immune response and has been referred to [facility]. Follow up on and that is pending rec Continue current cough care including Neurontin, nasal treatment and acid reflux treatment Please note fish oil can make acid reflux worse; best interest if you stop it Please fu at [facility] to have your immune system sorted out     03/27/2014 f/u ov/Kattleya Kuhnert re: recurrent cough x 2 months, esp bad x 2 weeks  Chief Complaint  Patient presents with  . Acute Visit    Pt c/o increased cough, SOB, congestion, and chest tightness for the past 2 wks. Cough is prod with minimal yellow to green sputum.  She states she took round of levaquin and finished this 4 days ago. Symptoms were improved but for only 24 hours.   last "better" Oct - Dec 2015 then worse since xmas but even when better still using neb in evening as knows it's going to be worse when lie down  Back on fish oil again "I stopped it but my  cholesterol went through the roof"  Also maint on dulera 100 2 pffs/qvar 80 2 pffs bid and protonix 40 mg bid ac and hs zantac   No obvious day to day or daytime variabilty or assoc  cp or   overt hb symptoms. No unusual exp hx or h/o childhood pna/ asthma or knowledge of premature birth.  Sleeping ok without nocturnal  or early am exacerbation  of respiratory  c/o's or need for noct saba. Also denies any obvious fluctuation of symptoms with weather or environmental changes or other aggravating or alleviating factors except as outlined above   Current Medications, Allergies, Complete Past Medical History, Past Surgical History, Family History, and Social History were reviewed in [employer] record.  ROS  The following are not active complaints unless bolded sore throat, dysphagia, dental problems, itching, sneezing,  nasal congestion or excess/ purulent secretions, ear ache,   fever, chills, sweats, unintended wt loss, pleuritic or exertional cp, hemoptysis,  orthopnea pnd or leg swelling, presyncope, palpitations, heartburn, abdominal pain, anorexia, nausea, vomiting, diarrhea  or change in bowel or urinary habits, change in stools or urine, dysuria,hematuria,  rash, arthralgias, visual complaints, headache, numbness weakness or ataxia or problems with walking or coordination,  change in mood/affect or memory.                          Objective:   Physical Exam    . Wt Readings from Last 3 Encounters:  01/09/14 189 lb (85.73 kg)  01/08/14 189 lb (85.73 kg)  09/21/13 196 lb (88.905 kg)    Vital signs reviewed     obese hoarse wf with freq throat clearing and harsh upper airway coughing    Sitting in a wheelchair   HEENT: nl dentition, turbinates, and orophanx. Nl external ear canals without cough reflex   NECK :  without JVD/Nodes/TM/ nl carotid upstrokes bilaterally   LUNGS: no acc muscle use, insp and exp rhonchi bilaterally difficult to  separate from upper airway sounds    CV:  RRR  no s3 or murmur or increase in P2, no edema   ABD:  soft and nontender with nl excursion in the supine position. No bruits or organomegaly, bowel sounds nl  MS:  warm without deformities, calf tenderness, cyanosis or clubbing  SKIN: warm and dry without lesions    NEURO:  alert, approp, no deficits              Assessment & Plan:

## 2014-03-27 NOTE — Telephone Encounter (Signed)
Spoke with pt: she took Levaquin for 14 days.  Also took Pred 10mg  tabs- 30mg  X3 days, 20mg  X3 days, 10mg  X7 days- finished prednisone last Thursday.

## 2014-03-27 NOTE — Telephone Encounter (Signed)
Needs to come in - likely this is post viral reactic cough and nothing can be done, ? Reactive airway , ? cylical cough back. Please set her with Krystal Powell. Needs spirometry on day of visit. Might need to consider gabapentin if not taking it

## 2014-03-27 NOTE — Telephone Encounter (Signed)
waht antibiotic? How many days prednisone?  What was start date and last date for both and what was starting dose on prednisone?  Thanks  Dr. Brand Males, M.D., [facility].C.P Pulmonary and Critical Care Medicine Staff Physician [facility] Pager: [phone], If no answer or between  15:00h - 7:00h: call [phone]  03/27/2014 11:28 AM   '

## 2014-03-27 NOTE — Patient Instructions (Addendum)
Levaquin 500 mg x 10 days. Prednisone 10 mg take  4 each am x 2 days,   2 each am x 2 days,  1 each am x 2 days and stop   GERD (REFLUX)  is an extremely common cause of respiratory symptoms just like yours , many times with no obvious heartburn at all.    It can be treated with medication, but also with lifestyle changes including avoidance of late meals, excessive alcohol, smoking cessation, and avoid fatty foods, chocolate, peppermint, colas, red wine, and acidic juices such as orange juice.  NO MINT OR MENTHOL PRODUCTS SO NO COUGH DROPS  USE SUGARLESS CANDY INSTEAD (Jolley ranchers or Stover's or Life Savers) or even ice chips will also do - the key is to swallow to prevent all throat clearing. NO OIL BASED VITAMINS - use powdered substitutes.  Stop the fish oil when coughing for any reason  Try flutter valve to help bring up mucus more effectively  Only use your albuterol as a rescue medication to be used if you can't catch your breath by resting or doing a relaxed purse lip breathing pattern.  - The less you use it, the better it will work when you need it. - Ok to use up to 2 puffs  every 4 hours if you must but call for immediate appointment if use goes up over your usual need - Don't leave home without it !!  (think of it like the spare tire for your car)  - try to cut back you use of duobneb if possible   Return to see Dr Chase Caller if not able to connect back up with [facility]

## 2014-03-27 NOTE — Telephone Encounter (Signed)
Called and spoke to pt. TP has no availability. Appt made with MW on 03/27/2014. Pt verbalized understanding and denied any further questions or concerns at this time.

## 2014-03-27 NOTE — Telephone Encounter (Signed)
Spoke with pt. Reports deep barking cough. Congestion, chest tightness and SOB are also present. Was given antibiotic and prednisone by another physician.  MR - please advise. Thanks.

## 2014-05-03 ENCOUNTER — Encounter: Payer: Self-pay | Admitting: Internal Medicine

## 2014-08-31 ENCOUNTER — Other Ambulatory Visit (HOSPITAL_COMMUNITY): Payer: Self-pay | Admitting: Internal Medicine

## 2014-08-31 DIAGNOSIS — IMO0002 Reserved for concepts with insufficient information to code with codable children: Secondary | ICD-10-CM

## 2014-08-31 DIAGNOSIS — N644 Mastodynia: Secondary | ICD-10-CM

## 2014-08-31 DIAGNOSIS — R229 Localized swelling, mass and lump, unspecified: Principal | ICD-10-CM

## 2014-09-05 ENCOUNTER — Ambulatory Visit (HOSPITAL_COMMUNITY)
Admission: RE | Admit: 2014-09-05 | Discharge: 2014-09-05 | Disposition: A | Discharge status: Home / Self Care | Payer: Commercial Managed Care - HMO | Source: Ambulatory Visit | Attending: Internal Medicine | Admitting: Internal Medicine

## 2014-09-05 ENCOUNTER — Other Ambulatory Visit (HOSPITAL_COMMUNITY): Payer: Self-pay | Admitting: Internal Medicine

## 2014-09-05 DIAGNOSIS — N6320 Unspecified lump in the left breast, unspecified quadrant: Secondary | ICD-10-CM

## 2014-09-05 DIAGNOSIS — N644 Mastodynia: Secondary | ICD-10-CM

## 2014-09-05 DIAGNOSIS — N63 Unspecified lump in breast: Secondary | ICD-10-CM | POA: Insufficient documentation

## 2014-09-05 DIAGNOSIS — R229 Localized swelling, mass and lump, unspecified: Secondary | ICD-10-CM

## 2014-09-05 DIAGNOSIS — IMO0002 Reserved for concepts with insufficient information to code with codable children: Secondary | ICD-10-CM

## 2014-09-23 ENCOUNTER — Emergency Department (HOSPITAL_COMMUNITY): Payer: Commercial Managed Care - HMO

## 2014-09-23 ENCOUNTER — Emergency Department (HOSPITAL_COMMUNITY)
Admission: EM | Admit: 2014-09-23 | Discharge: 2014-09-23 | Disposition: A | Discharge status: Home / Self Care | Payer: Commercial Managed Care - HMO | Attending: Emergency Medicine | Admitting: Emergency Medicine

## 2014-09-23 ENCOUNTER — Encounter (HOSPITAL_COMMUNITY): Payer: Self-pay | Admitting: Emergency Medicine

## 2014-09-23 DIAGNOSIS — E039 Hypothyroidism, unspecified: Secondary | ICD-10-CM | POA: Diagnosis not present

## 2014-09-23 DIAGNOSIS — I10 Essential (primary) hypertension: Secondary | ICD-10-CM | POA: Diagnosis not present

## 2014-09-23 DIAGNOSIS — E119 Type 2 diabetes mellitus without complications: Secondary | ICD-10-CM | POA: Diagnosis not present

## 2014-09-23 DIAGNOSIS — Z7951 Long term (current) use of inhaled steroids: Secondary | ICD-10-CM | POA: Diagnosis not present

## 2014-09-23 DIAGNOSIS — Z87738 Personal history of other specified (corrected) congenital malformations of digestive system: Secondary | ICD-10-CM | POA: Insufficient documentation

## 2014-09-23 DIAGNOSIS — Z8619 Personal history of other infectious and parasitic diseases: Secondary | ICD-10-CM | POA: Insufficient documentation

## 2014-09-23 DIAGNOSIS — M1990 Unspecified osteoarthritis, unspecified site: Secondary | ICD-10-CM | POA: Diagnosis not present

## 2014-09-23 DIAGNOSIS — Z86018 Personal history of other benign neoplasm: Secondary | ICD-10-CM | POA: Diagnosis not present

## 2014-09-23 DIAGNOSIS — G8929 Other chronic pain: Secondary | ICD-10-CM | POA: Insufficient documentation

## 2014-09-23 DIAGNOSIS — J441 Chronic obstructive pulmonary disease with (acute) exacerbation: Secondary | ICD-10-CM | POA: Insufficient documentation

## 2014-09-23 DIAGNOSIS — E780 Pure hypercholesterolemia: Secondary | ICD-10-CM | POA: Insufficient documentation

## 2014-09-23 DIAGNOSIS — Z880 Allergy status to penicillin: Secondary | ICD-10-CM | POA: Diagnosis not present

## 2014-09-23 DIAGNOSIS — Z792 Long term (current) use of antibiotics: Secondary | ICD-10-CM | POA: Insufficient documentation

## 2014-09-23 DIAGNOSIS — M797 Fibromyalgia: Secondary | ICD-10-CM | POA: Diagnosis not present

## 2014-09-23 DIAGNOSIS — Z79899 Other long term (current) drug therapy: Secondary | ICD-10-CM | POA: Insufficient documentation

## 2014-09-23 DIAGNOSIS — F419 Anxiety disorder, unspecified: Secondary | ICD-10-CM | POA: Insufficient documentation

## 2014-09-23 DIAGNOSIS — J387 Other diseases of larynx: Secondary | ICD-10-CM | POA: Diagnosis not present

## 2014-09-23 DIAGNOSIS — K219 Gastro-esophageal reflux disease without esophagitis: Secondary | ICD-10-CM | POA: Insufficient documentation

## 2014-09-23 DIAGNOSIS — R0602 Shortness of breath: Secondary | ICD-10-CM | POA: Diagnosis present

## 2014-09-23 DIAGNOSIS — Z791 Long term (current) use of non-steroidal anti-inflammatories (NSAID): Secondary | ICD-10-CM | POA: Insufficient documentation

## 2014-09-23 DIAGNOSIS — Z8701 Personal history of pneumonia (recurrent): Secondary | ICD-10-CM | POA: Insufficient documentation

## 2014-09-23 LAB — CBC WITH DIFFERENTIAL/PLATELET
Basophils Absolute: 0.1 10*3/uL (ref 0.0–0.1)
Basophils Relative: 1 % (ref 0–1)
Eosinophils Absolute: 0.3 10*3/uL (ref 0.0–0.7)
Eosinophils Relative: 3 % (ref 0–5)
HEMATOCRIT: 41.4 % (ref 36.0–46.0)
HEMOGLOBIN: 13.9 g/dL (ref 12.0–15.0)
LYMPHS ABS: 2 10*3/uL (ref 0.7–4.0)
Lymphocytes Relative: 24 % (ref 12–46)
MCH: 30.5 pg (ref 26.0–34.0)
MCHC: 33.6 g/dL (ref 30.0–36.0)
MCV: 90.8 fL (ref 78.0–100.0)
MONOS PCT: 7 % (ref 3–12)
Monocytes Absolute: 0.6 10*3/uL (ref 0.1–1.0)
NEUTROS ABS: 5.6 10*3/uL (ref 1.7–7.7)
Neutrophils Relative %: 65 % (ref 43–77)
Platelets: 234 10*3/uL (ref 150–400)
RBC: 4.56 MIL/uL (ref 3.87–5.11)
RDW: 13.3 % (ref 11.5–15.5)
WBC: 8.6 10*3/uL (ref 4.0–10.5)

## 2014-09-23 LAB — BASIC METABOLIC PANEL
Anion gap: 9 (ref 5–15)
BUN: 25 mg/dL — ABNORMAL HIGH (ref 6–20)
CHLORIDE: 101 mmol/L (ref 101–111)
CO2: 28 mmol/L (ref 22–32)
Calcium: 9.1 mg/dL (ref 8.9–10.3)
Creatinine, Ser: 1.75 mg/dL — ABNORMAL HIGH (ref 0.44–1.00)
GFR calc non Af Amer: 29 mL/min — ABNORMAL LOW (ref >60)
GFR, EST AFRICAN AMERICAN: 33 mL/min — AB (ref >60)
Glucose, Bld: 195 mg/dL — ABNORMAL HIGH (ref 65–99)
Potassium: 3.8 mmol/L (ref 3.5–5.1)
Sodium: 138 mmol/L (ref 135–145)

## 2014-09-23 MED ORDER — PREDNISONE 50 MG PO TABS
60.0000 mg | ORAL_TABLET | Freq: Once | ORAL | Status: AC
  Administered 2014-09-23: 60 mg via ORAL
  Filled 2014-09-23 (×2): qty 1

## 2014-09-23 MED ORDER — IPRATROPIUM-ALBUTEROL 0.5-2.5 (3) MG/3ML IN SOLN
3.0000 mL | Freq: Once | RESPIRATORY_TRACT | Status: AC
  Administered 2014-09-23: 3 mL via RESPIRATORY_TRACT
  Filled 2014-09-23: qty 3

## 2014-09-23 MED ORDER — PREDNISONE 50 MG PO TABS: ORAL_TABLET | ORAL | Status: DC

## 2014-09-23 NOTE — ED Notes (Signed)
Pt c/o worsening SOB over the past 2 weeks. Audible wheezing and dyspena.

## 2014-09-23 NOTE — ED Provider Notes (Signed)
CSN: [id number]     Arrival date & time 09/23/14  1050 History  This chart was scribed for Ripley Fraise, MD by Starleen Arms, ED Scribe. This patient was seen in room APA16A/[facility]16A and the patient's care was started at 11:14 AM.   Chief Complaint  Patient presents with  . Shortness of Breath   The history is provided by the patient. No language interpreter was used.   HPI Comments: Krystal Powell is a 69 y.o. female with hx of COPD/asthmawho presents to the Emergency Department complaining of worsening SOB onset two weeks ago, worse with walking.  She reports more difficulty with inspiration than expiration and describes the complaint as "like my lungs feel fllattened out."  Associated symptoms include sore throat onset 3 weeks ago, voice change, and lightheadedness.  She reports this episode feels different than her usual asthma exacerbation.  She denies cough, hemoptysis, back pain, CP. She denies syncope She reports she unable to lie flat but this is chronic  Past Medical History  Diagnosis Date  . COPD (chronic obstructive pulmonary disease)   . Hypertension   . Asthma   . GERD (gastroesophageal reflux disease)   . Hiatal hernia   . Schatzki's ring   . Adenomatous polyps     Dr Arlana Pouch  . Spondylosis   . Hypercholesteremia   . Anxiety   . Hypothyroidism   . PONV (postoperative nausea and vomiting)   . Degenerative disc disease   . Chronic back pain   . Larynx disease     pt's left side if stiff and has protein deposits on it which causes her to lose air on that side and can't talk as well due to it  . Arthritis   . DM (diabetes mellitus)     when on prednisone  . Status post dilation of esophageal narrowing   . Fibromyalgia   . History of gallstones   . IBS (irritable bowel syndrome)   . History of pneumonia   . Shingles    Past Surgical History  Procedure Laterality Date  . Knee surgery      multiple knee arthroscopies  . Tonsillectomy and adenoidectomy    .  Bladder suspension    . Partial hysterectomy    . Appendectomy    . Cholecystectomy    . Umbilical hernia repair    . Laparoscopy    . Carotid endarterectomy      right  . Sinus exploration      x 2  . Shoulder surgery      right  . Foot surgery      right  . Maloney dilation  12/18/2010    Procedure: Venia Minks DILATION;  Surgeon: Daneil Dolin, MD;  Location: [facility];  Service: Endoscopy;  Laterality: N/A;  #56 dilator  . Abdominal hysterectomy      partial  . Tubal ligation    . Cataract extraction w/phaco  03/24/2011    Procedure: CATARACT EXTRACTION PHACO AND INTRAOCULAR LENS PLACEMENT (IOC);  Surgeon: Tonny Branch, MD;  Location: [facility];  Service: Ophthalmology;  Laterality: Left;  CDE 15.27  . Cataract extraction w/phaco  04/03/2011    Procedure: CATARACT EXTRACTION PHACO AND INTRAOCULAR LENS PLACEMENT (IOC);  Surgeon: Tonny Branch, MD;  Location: [facility];  Service: Ophthalmology;  Laterality: Right;  CDE:7.41  . Breast excisional biopsy      left, x 3-4  . Breast excisional biopsy      right  . Larynx surgery  polyp removed  . Esophageal manometry  06/09/2011    Procedure: ESOPHAGEAL MANOMETRY (EM);  Surgeon: Jerene Bears, MD;  Location: [facility];  Service: Gastroenterology;  Laterality: N/A;  . Lumbar laminectomy/decompression microdiscectomy  01/19/2012    Procedure: LUMBAR LAMINECTOMY/DECOMPRESSION MICRODISCECTOMY 1 LEVEL;  Surgeon: Winfield Cunas, MD;  Location: [place] [facility];  Service: Neurosurgery;  Laterality: Left;  LEFT Lumbar three-four diskectomy   Family History  Problem Relation Age of Onset  . GER disease Mother   . Goiter Mother     malignant  . GER disease Father   . Anesthesia problems Neg Hx   . Malignant hyperthermia Neg Hx   . Pseudochol deficiency Neg Hx   . Colon polyps Daughter   . Colon polyps Son   . Diabetes Sister   . Diabetes Brother   . Heart disease Mother   . Heart disease Father   . Heart disease Brother   . Irritable bowel syndrome  Mother    Social History  Substance Use Topics  . Smoking status: Never Smoker   . Smokeless tobacco: Never Used  . Alcohol Use: No   OB History    Gravida Para Term Preterm AB TAB SAB Ectopic Multiple Living   3 3  3      3      Review of Systems  Constitutional: Negative for fever.  HENT: Positive for sore throat and voice change.   Respiratory: Positive for shortness of breath and wheezing.   Gastrointestinal: Negative for vomiting.  Neurological: Positive for dizziness.  All other systems reviewed and are negative.     Allergies  Sulfonamide derivatives; Adhesive; Amoxicillin-pot clavulanate; Avelox; Demeclocycline hcl; Doxycycline; Iodinated diagnostic agents; Iodine; Iohexol; Thorazine; Codeine; and Erythromycin  Home Medications   Prior to Admission medications   Medication Sig Start Date End Date Taking? Authorizing Provider  acetaminophen (TYLENOL) 500 MG tablet Take 1,000 mg by mouth 2 (two) times daily.      Historical Provider, MD  albuterol (PROVENTIL HFA;VENTOLIN HFA) 108 (90 BASE) MCG/ACT inhaler Inhale 2 puffs into the lungs every 6 (six) hours as needed. For shortness of breath     Historical Provider, MD  ALPRAZolam Duanne Moron) 0.5 MG tablet Take 0.5 mg by mouth at bedtime.     Historical Provider, MD  azelastine (ASTELIN) 137 MCG/SPRAY nasal spray Place 1 spray into the nose 2 (two) times daily. Use in each nostril as directed    Historical Provider, MD  baclofen (LIORESAL) 10 MG tablet Take 10 mg by mouth 3 (three) times daily.    Historical Provider, MD  celecoxib (CELEBREX) 200 MG capsule Take 200 mg by mouth daily.    Historical Provider, MD  cetirizine (ZYRTEC) 10 MG tablet Take 10 mg by mouth at bedtime.     Historical Provider, MD  Cholecalciferol (VITAMIN D) 2000 UNITS tablet Take 2,000 Units by mouth every morning.      Historical Provider, MD  Cyanocobalamin 2500 MCG TABS Take 1 tablet by mouth every morning.     Historical Provider, MD  diazepam  (VALIUM) 5 MG tablet Take one tablet every six hours as needed for dizziness. 02/25/13   Veryl Speak, MD  diltiazem (CARDIZEM) 120 MG tablet Take 120 mg by mouth daily.    Historical Provider, MD  diphenhydrAMINE (BENADRYL) 25 MG tablet Take 25 mg by mouth at bedtime.    Historical Provider, MD  fluticasone (FLONASE) 50 MCG/ACT nasal spray Place 1 spray into the nose 2 (two) times daily.  Historical Provider, MD  gabapentin (NEURONTIN) 300 MG capsule Take 1 capsule (300 mg total) by mouth 2 (two) times daily. 11/11/12   Brand Males, MD  ipratropium-albuterol (DUONEB) 0.5-2.5 (3) MG/3ML SOLN Take 3 mLs by nebulization 4 (four) times daily as needed. For shortness of breath    Historical Provider, MD  levofloxacin (LEVAQUIN) 500 MG tablet Take 1 tablet (500 mg total) by mouth daily. 03/27/14   Tanda Rockers, MD  levothyroxine (SYNTHROID, LEVOTHROID) 100 MCG tablet Take 100 mcg by mouth every morning.  04/23/10   Historical Provider, MD  meclizine (ANTIVERT) 25 MG tablet Take 50 mg by mouth 4 (four) times daily. For dizziness    Historical Provider, MD  metFORMIN (GLUCOPHAGE) 500 MG tablet Take 500 mg by mouth daily with breakfast.    Historical Provider, MD  mometasone-formoterol (DULERA) 100-5 MCG/ACT AERO Inhale 2 puffs into the lungs 2 (two) times daily.    Historical Provider, MD  naproxen sodium (ANAPROX) 220 MG tablet Take 220 mg by mouth daily as needed.    Historical Provider, MD  Omega-3 Fatty Acids (FISH OIL) 1000 MG CAPS Take 1 capsule by mouth every morning.     Historical Provider, MD  pantoprazole (PROTONIX) 40 MG tablet Take 20 mg by mouth 2 (two) times daily. With breakfast and supper 05/27/11 03/27/14  Jerene Bears, MD  potassium chloride (K-DUR,KLOR-CON) 10 MEQ tablet Take 10 mEq by mouth every morning.  04/02/10   Historical Provider, MD  pravastatin (PRAVACHOL) 20 MG tablet Take 20 mg by mouth at bedtime.     Historical Provider, MD  predniSONE (DELTASONE) 10 MG tablet Take  4  each am x 2 days,   2 each am x 2 days,  1 each am x 2 days and stop 03/27/14   Tanda Rockers, MD  ranitidine (ZANTAC) 150 MG tablet Take 150 mg by mouth at bedtime.    Historical Provider, MD  Red Yeast Rice 600 MG CAPS Take 1 capsule by mouth every morning.      Historical Provider, MD  Respiratory Therapy Supplies (FLUTTER) DEVI Use as directed 03/27/14   Tanda Rockers, MD  SINGULAIR 10 MG tablet Take 10 mg by mouth at bedtime.  04/22/10   Historical Provider, MD  trimethoprim (TRIMPEX) 100 MG tablet Take 100 mg by mouth at bedtime.    Historical Provider, MD  vitamin E 1000 UNIT capsule Take 1,000 Units by mouth every morning.      Historical Provider, MD   BP 169/90 mmHg  Pulse 88  Temp(Src) 98.1 F (36.7 C) (Oral)  Resp 24  Ht 5\' 2"  (1.575 m)  Wt 196 lb (88.905 kg)  BMI 35.84 kg/m2  SpO2 98% Physical Exam  Nursing note and vitals reviewed. CONSTITUTIONAL: Well developed/well nourished HEAD: Normocephalic/atraumatic EYES: EOMI/PERRL ENMT: Mucous membranes moist; uvula midline, no erythema, exudate, mild stridor noted. NECK: supple no meningeal signs SPINE/BACK:entire spine nontender CV: S1/S2 noted, no murmurs/rubs/gallops noted LUNGS: Scattered wheezing bilaterally, mild tachypnea. ABDOMEN: soft, nontender, no rebound or guarding, bowel sounds noted throughout abdomen GU:no cva tenderness NEURO: Pt is awake/alert/appropriate, moves all extremitiesx4.  No facial droop.   EXTREMITIES: pulses normal/equal, full ROM SKIN: warm, color normal PSYCH: no abnormalities of mood noted, alert and oriented to situation   ED Course  Procedures  Medications  ipratropium-albuterol (DUONEB) 0.5-2.5 (3) MG/3ML nebulizer solution 3 mL (3 mLs Nebulization Given 09/23/14 1118)  predniSONE (DELTASONE) tablet 60 mg (60 mg Oral Given 09/23/14 1109)  DIAGNOSTIC STUDIES: Oxygen Saturation is 98% on RA, normal by my interpretation.    COORDINATION OF CARE:  11:23 AM Will order imaging and  breathing treatment.  Patient acknowledges and agrees with plan.   3:11 PM Pt with abnormal CT findings Unable to give IV contrast due to AKI and allergy to IV contrat D/w dr Janace Hoard with ENT Requests consult ENT at [facility] due possibility of difficult airway 3:24 PM D/w dr winford with ENT at [facility] We discussed CT imaging We discussed patient presentation Recommends prednisone He will have her worked into ENT clinic this week If any worsening she needs to return to ER 4:09 PM Pt appears improved She reports gradual worsening for 2 weeks No acute change today She ambulated in ED without difficulty She feels comfortable going home Discussed need to treat COPD and continue prednisone Discussed strict ER return precautions, and she lives with family who will watch out for her  Labs Review Labs Reviewed  BASIC METABOLIC PANEL - Abnormal; Notable for the following:    Glucose, Bld 195 (*)    BUN 25 (*)    Creatinine, Ser 1.75 (*)    GFR calc non Af Amer 29 (*)    GFR calc Af Amer 33 (*)    All other components within normal limits  CBC WITH DIFFERENTIAL/PLATELET    Imaging Review Dg Neck Soft Tissue  09/23/2014   CLINICAL DATA:  Short of breath for 2 weeks. Sinus drainage. Sore throat. Asthma and COPD. Diabetic.  EXAM: NECK SOFT TISSUES - 1+ VIEW  COMPARISON:  None.  FINDINGS: AP and lateral soft tissue views of the neck. Prevertebral soft tissues are unremarkable. Epiglottis is normal. Within the supraglottic region on the lateral view, there is soft tissue fullness within the airway. This measures approximately 1.4 cm. Not well localized on the frontal radiograph. Degenerative disc disease involves C5-6.  IMPRESSION: Apparent soft tissue fullness within the supraglottic airway. Although this could be artifactual or due to overlap of soft tissues, further evaluation with (ideally contrast-enhanced) neck CT is suggested.   Electronically Signed   By: Abigail Miyamoto M.D.   On: 09/23/2014  12:35   Dg Chest 2 View  09/23/2014   CLINICAL DATA:  Shortness of breath for 2 weeks. History of asthma and COPD.  EXAM: CHEST  2 VIEW  COMPARISON:  03/22/2012; 02/26/2012; chest CT - 05/31/2012  FINDINGS: Grossly unchanged borderline enlarged cardiac silhouette and mediastinal contours given persistently reduced lung volumes. There is persistent mild elevation / eventration of the anterior medial aspect of the right hemidiaphragm. Linear heterogeneous opacities radiating from the right hilum are unchanged and favored to represent atelectasis or scar as demonstrated on prior chest CT. No new focal airspace opacities. No pleural effusion or pneumothorax. No evidence of edema. No acute osseus abnormalities. Post cholecystectomy.  IMPRESSION: 1.  No acute cardiopulmonary disease. 2. Unchanged right mid lung linear atelectasis/scar.   Electronically Signed   By: Sandi Mariscal M.D.   On: 09/23/2014 12:33   Ct Soft Tissue Neck Wo Contrast  09/23/2014   CLINICAL DATA:  Short of breath. Difficulty breathing. Abnormal x-ray.  EXAM: CT NECK WITHOUT CONTRAST  TECHNIQUE: Multidetector CT imaging of the neck was performed using the standard protocol without intravenous contrast.  COMPARISON:  Soft tissue neck 09/23/2014  FINDINGS: Pharynx and larynx: Oropharynx and nasopharynx normal. Epiglottis normal. Polypoid soft tissue mass projects into the supraglottic airway. This appears to be a polyp arising from the aryepiglottic fold on the  right. This measures 10 x 9 mm on axial images and has soft tissue density. Is well-circumscribed. Vocal cords appear normal.  Salivary glands: Fatty submandibular and parotid gland without mass lesion.  Thyroid: Negative  Lymph nodes: Negative for adenopathy.  Vascular: Carotid artery calcification bilaterally.  Limited intracranial: Negative  Visualized orbits: Bilateral lens extraction.  No orbital mass.  Mastoids and visualized paranasal sinuses: Negative  Skeleton: Cervical spondylosis  at C5-6.  No acute bony lesion.  Upper chest: Soft tissue thickening along the medial pleura of the right upper lobe unchanged from chest CT 06/10/2011. This is most consistent with right upper lobe scarring.  IMPRESSION: Polypoid mass arising from the aryepiglottic fold on the right projecting into the supraglottic airway. This measures 10 x 9 mm. This is probably a benign polyp however direct visualization and biopsy recommended to exclude neoplasm. No adenopathy.  Right upper lobe scarring is stable.   Electronically Signed   By: Franchot Gallo M.D.   On: 09/23/2014 14:25   I have personally reviewed and evaluated these images and lab results as part of my medical decision-making.   EKG Interpretation   Date/Time:  Saturday September 23 2014 11:00:44 EDT Ventricular Rate:  90 PR Interval:  170 QRS Duration: 81 QT Interval:  375 QTC Calculation: 459 R Axis:   -92 Text Interpretation:  Sinus arrhythmia Left anterior fascicular block  Abnormal R-wave progression, late transition artifact noted Confirmed by  Christy Gentles  MD, Verlan Grotz ([id number]) on 09/23/2014 11:36:45 AM      MDM   Final diagnoses:  Epiglottic lesion  COPD exacerbation    Nursing notes including past medical history and social history reviewed and considered in documentation xrays/imaging reviewed by myself and considered during evaluation Labs/vital reviewed myself and considered during evaluation   I personally performed the services described in this documentation, which was scribed in my presence. The recorded information has been reviewed and is accurate.      Ripley Fraise, MD 09/23/14 [phone]

## 2015-07-17 ENCOUNTER — Ambulatory Visit (INDEPENDENT_AMBULATORY_CARE_PROVIDER_SITE_OTHER): Payer: Commercial Managed Care - HMO

## 2015-07-17 ENCOUNTER — Encounter: Payer: Self-pay | Admitting: Orthopedic Surgery

## 2015-07-17 ENCOUNTER — Ambulatory Visit (INDEPENDENT_AMBULATORY_CARE_PROVIDER_SITE_OTHER): Payer: Commercial Managed Care - HMO | Admitting: Orthopedic Surgery

## 2015-07-17 DIAGNOSIS — M25561 Pain in right knee: Secondary | ICD-10-CM

## 2015-07-17 DIAGNOSIS — M25562 Pain in left knee: Secondary | ICD-10-CM

## 2015-07-17 DIAGNOSIS — M170 Bilateral primary osteoarthritis of knee: Secondary | ICD-10-CM

## 2015-07-17 NOTE — Progress Notes (Signed)
Chief Complaint  Patient presents with  . Knee Pain    bilatereal knee pain    HPI 70 year old female presents for reevaluation of bilateral knee pain left worse than right. She's had PVCs history of osteoarthritis in both knees. She went to her primary care physician and complained of recurrent knee pain and asked for injections and they sent her here for evaluation treatment and possible injection  Her knee pain seems to be related to stiffness medial and lateral joint line pain with weightbearing pain pain getting out of bed in the morning getting out of a chair. She is diabetic catching locking or giving way Review of Systems  Constitutional: Negative for fever and chills.  Musculoskeletal: Positive for myalgias and joint pain.  Neurological: Negative for tingling.    Past Medical History  Diagnosis Date  . COPD (chronic obstructive pulmonary disease) ([place])   . Hypertension   . Asthma   . GERD (gastroesophageal reflux disease)   . Hiatal hernia   . Schatzki's ring   . Adenomatous polyps     Dr Arlana Pouch  . Spondylosis   . Hypercholesteremia   . Anxiety   . Hypothyroidism   . PONV (postoperative nausea and vomiting)   . Degenerative disc disease   . Chronic back pain   . Larynx disease     pt's left side if stiff and has protein deposits on it which causes her to lose air on that side and can't talk as well due to it  . Arthritis   . DM (diabetes mellitus) ([place])     when on prednisone  . Status post dilation of esophageal narrowing   . Fibromyalgia   . History of gallstones   . IBS (irritable bowel syndrome)   . History of pneumonia   . Shingles     Past Surgical History  Procedure Laterality Date  . Knee surgery      multiple knee arthroscopies  . Tonsillectomy and adenoidectomy    . Bladder suspension    . Partial hysterectomy    . Appendectomy    . Cholecystectomy    . Umbilical hernia repair    . Laparoscopy    . Carotid endarterectomy      right  . Sinus  exploration      x 2  . Shoulder surgery      right  . Foot surgery      right  . Maloney dilation  12/18/2010    Procedure: Venia Minks DILATION;  Surgeon: Daneil Dolin, MD;  Location: [facility];  Service: Endoscopy;  Laterality: N/A;  #56 dilator  . Abdominal hysterectomy      partial  . Tubal ligation    . Cataract extraction w/phaco  03/24/2011    Procedure: CATARACT EXTRACTION PHACO AND INTRAOCULAR LENS PLACEMENT (IOC);  Surgeon: Tonny Branch, MD;  Location: [facility];  Service: Ophthalmology;  Laterality: Left;  CDE 15.27  . Cataract extraction w/phaco  04/03/2011    Procedure: CATARACT EXTRACTION PHACO AND INTRAOCULAR LENS PLACEMENT (IOC);  Surgeon: Tonny Branch, MD;  Location: [facility];  Service: Ophthalmology;  Laterality: Right;  CDE:7.41  . Breast excisional biopsy      left, x 3-4  . Breast excisional biopsy      right  . Larynx surgery      polyp removed  . Esophageal manometry  06/09/2011    Procedure: ESOPHAGEAL MANOMETRY (EM);  Surgeon: Jerene Bears, MD;  Location: [facility];  Service: Gastroenterology;  Laterality: N/A;  .  Lumbar laminectomy/decompression microdiscectomy  01/19/2012    Procedure: LUMBAR LAMINECTOMY/DECOMPRESSION MICRODISCECTOMY 1 LEVEL;  Surgeon: Winfield Cunas, MD;  Location: [facility];  Service: Neurosurgery;  Laterality: Left;  LEFT Lumbar three-four diskectomy   Family History  Problem Relation Age of Onset  . GER disease Mother   . Goiter Mother     malignant  . GER disease Father   . Anesthesia problems Neg Hx   . Malignant hyperthermia Neg Hx   . Pseudochol deficiency Neg Hx   . Colon polyps Daughter   . Colon polyps Son   . Diabetes Sister   . Diabetes Brother   . Heart disease Mother   . Heart disease Father   . Heart disease Brother   . Irritable bowel syndrome Mother    Social History  Substance Use Topics  . Smoking status: Never Smoker   . Smokeless tobacco: Never Used  . Alcohol Use: No    Current outpatient prescriptions:  .   acetaminophen (TYLENOL) 500 MG tablet, Take 1,000 mg by mouth 2 (two) times daily.  , Disp: , Rfl:  .  ALPRAZolam (XANAX) 0.5 MG tablet, Take 0.5 mg by mouth at bedtime. , Disp: , Rfl:  .  cetirizine (ZYRTEC) 10 MG tablet, Take 10 mg by mouth at bedtime. , Disp: , Rfl:  .  Cholecalciferol (VITAMIN D) 2000 UNITS tablet, Take 2,000 Units by mouth every morning.  , Disp: , Rfl:  .  CINNAMON PO, Take 1 tablet by mouth daily., Disp: , Rfl:  .  CRANBERRY PO, Take 1 tablet by mouth daily., Disp: , Rfl:  .  Cyanocobalamin 2500 MCG TABS, Take 1 tablet by mouth every morning. , Disp: , Rfl:  .  diltiazem (CARDIZEM) 120 MG tablet, Take 120 mg by mouth daily., Disp: , Rfl:  .  diphenhydrAMINE (BENADRYL) 25 MG tablet, Take 25 mg by mouth at bedtime., Disp: , Rfl:  .  fluticasone (FLONASE) 50 MCG/ACT nasal spray, Place 1 spray into the nose 2 (two) times daily., Disp: , Rfl:  .  gabapentin (NEURONTIN) 300 MG capsule, Take 1 capsule (300 mg total) by mouth 2 (two) times daily., Disp: 60 capsule, Rfl: 3 .  ipratropium-albuterol (DUONEB) 0.5-2.5 (3) MG/3ML SOLN, Take 3 mLs by nebulization 4 (four) times daily as needed. For shortness of breath, Disp: , Rfl:  .  meclizine (ANTIVERT) 25 MG tablet, Take 50 mg by mouth 4 (four) times daily. For dizziness, Disp: , Rfl:  .  metFORMIN (GLUCOPHAGE) 500 MG tablet, Take 500 mg by mouth daily with breakfast., Disp: , Rfl:  .  Multiple Vitamins-Minerals (CENTRUM ADULTS PO), Take 1 tablet by mouth daily., Disp: , Rfl:  .  pantoprazole (PROTONIX) 40 MG tablet, Take 20 mg by mouth 2 (two) times daily. With breakfast and supper, Disp: , Rfl:  .  potassium chloride (K-DUR,KLOR-CON) 10 MEQ tablet, Take 10 mEq by mouth every morning. , Disp: , Rfl:  .  pravastatin (PRAVACHOL) 20 MG tablet, Take 20 mg by mouth at bedtime. , Disp: , Rfl:  .  ranitidine (ZANTAC) 150 MG tablet, Take 150 mg by mouth at bedtime., Disp: , Rfl:  .  Red Yeast Rice 600 MG CAPS, Take 1 capsule by mouth every  morning.  , Disp: , Rfl:  .  Respiratory Therapy Supplies (FLUTTER) DEVI, Use as directed, Disp: 1 each, Rfl: 0 .  SINGULAIR 10 MG tablet, Take 10 mg by mouth at bedtime. , Disp: , Rfl:  .  trimethoprim (TRIMPEX) 100 MG tablet, Take 100 mg by mouth at bedtime., Disp: , Rfl:  .  vitamin E 1000 UNIT capsule, Take 1,000 Units by mouth every morning.  , Disp: , Rfl:   There were no vitals taken for this visit.  Physical Exam  Constitutional: She is oriented to person, place, and time. She appears well-developed and well-nourished. No distress.  Cardiovascular: Normal rate and intact distal pulses.   Musculoskeletal:       Right knee: She exhibits no effusion.       Left knee: She exhibits no effusion.  Neurological: She is alert and oriented to person, place, and time. She has normal reflexes. She exhibits normal muscle tone. Coordination normal.  Skin: Skin is warm and dry. No rash noted. She is not diaphoretic. No erythema. No pallor.  Psychiatric: She has a normal mood and affect. Her behavior is normal. Judgment and thought content normal.    Right Knee Exam   Tenderness  The patient is experiencing tenderness in the medial joint line and lateral joint line.  Range of Motion  Extension: normal  Right knee flexion: 115.   Muscle Strength   The patient has normal right knee strength.  Tests  Drawer:       Anterior - negative      Other  Erythema: absent Sensation: normal Pulse: present Swelling: none Other tests: no effusion present   Left Knee Exam   Tenderness  The patient is experiencing tenderness in the lateral joint line and medial joint line.  Range of Motion  Extension: normal  Left knee flexion: 115.   Muscle Strength   The patient has normal left knee strength.  Tests  Drawer:       Anterior - negative       Other  Erythema: absent Sensation: normal Pulse: present Swelling: none Effusion: no effusion present       ASSESSMENT: My personal  interpretation of the images:  I saw her last 2 years ago x-ray at osteoarthritis we took new x-rays today and she has arthritis in both knees. Fairly severe.   PLAN Recommend bilateral injections in a follow-up in several months to check on progress   Procedure note left knee injection verbal consent was obtained to inject left knee joint  Timeout was completed to confirm the site of injection  The medications used were 40 mg of Depo-Medrol and 1% lidocaine 3 cc  Anesthesia was provided by ethyl chloride and the skin was prepped with alcohol.  After cleaning the skin with alcohol a 20-gauge needle was used to inject the left knee joint. There were no complications. A sterile bandage was applied.   Procedure note right knee injection verbal consent was obtained to inject right knee joint  Timeout was completed to confirm the site of injection  The medications used were 40 mg of Depo-Medrol and 1% lidocaine 3 cc  Anesthesia was provided by ethyl chloride and the skin was prepped with alcohol.  After cleaning the skin with alcohol a 20-gauge needle was used to inject the right knee joint. There were no complications. A sterile bandage was applied.    Arther Abbott, MD 07/17/2015 9:19 PM

## 2015-07-17 NOTE — Patient Instructions (Signed)

## 2016-03-06 IMAGING — CT CT NECK W/O CM
4 series · 15 of 33 positions shown, 18 images · non-contrast
Comparison: Soft tissue neck 09/23/2014

CLINICAL DATA: Short of breath. Difficulty breathing. Abnormal
x-ray.

EXAM:
CT NECK WITHOUT CONTRAST
TECHNIQUE: Multidetector CT imaging of the neck was performed using the
standard protocol without intravenous contrast.

[Series 2: soft tissue neck 2.0 b31s · axial · 0.54mm/px · z∈[+102,+266]mm · 5 of 124 slices shown, 7 images]
[im 21/124  soft-tissue]
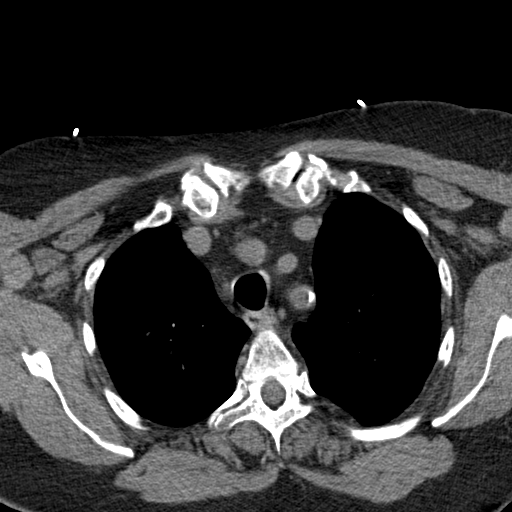
[im 21/124  bone]
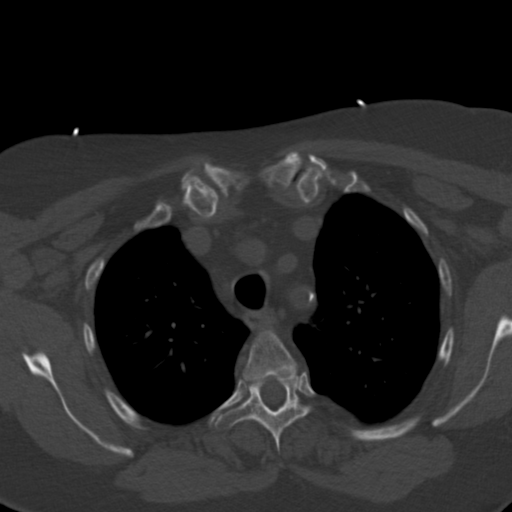
[im 42/124  bone]
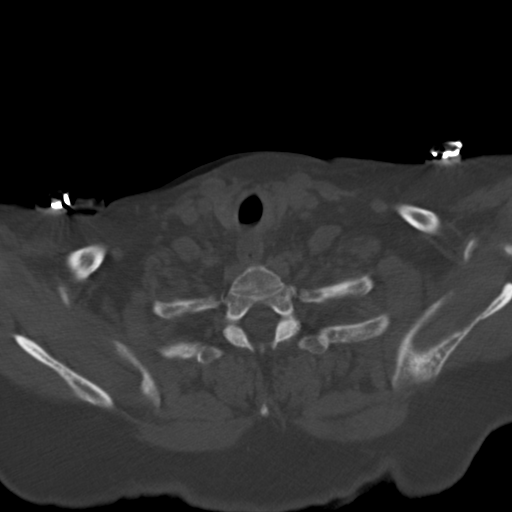
[im 62/124  bone]
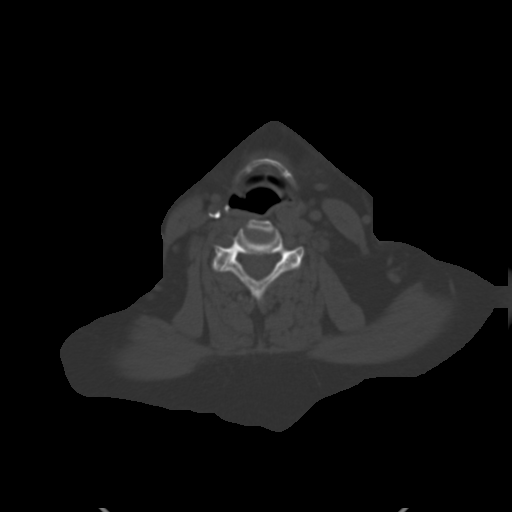
[im 83/124  bone]
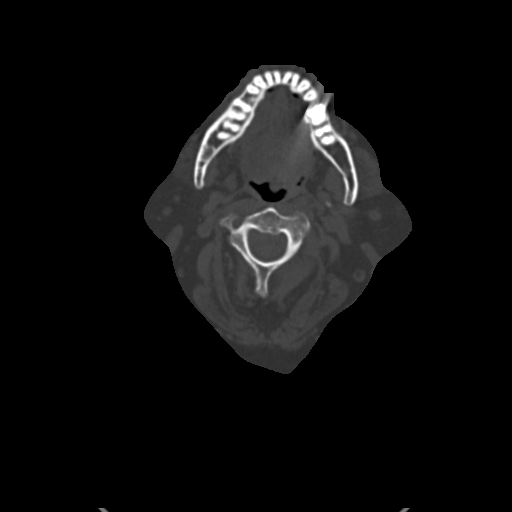
[im 103/124  soft-tissue]
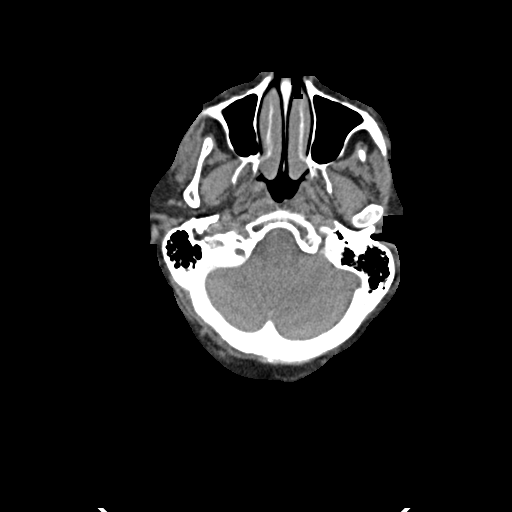
[im 103/124  bone]
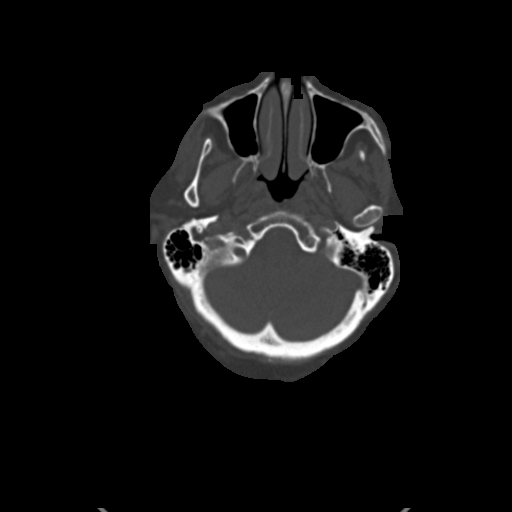

[Series 4: neck 2.0 soft tissue sag · sagittal · 0.49mm/px · 5 of 83 slices shown, 6 images]
[im 28/83  bone]
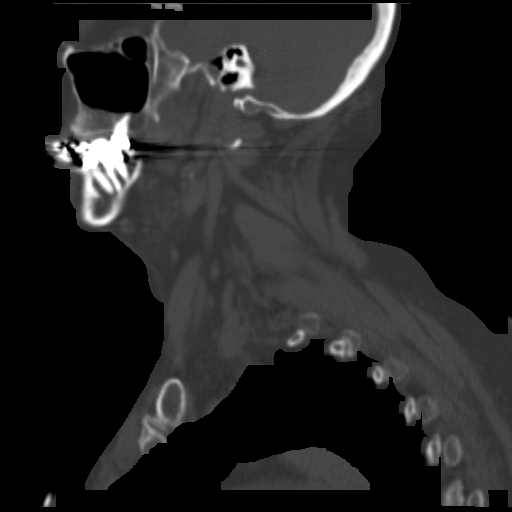
[im 35/83  bone]
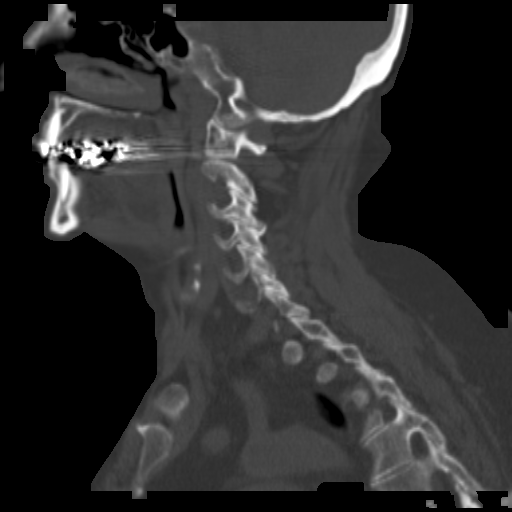
[im 42/83  soft-tissue]
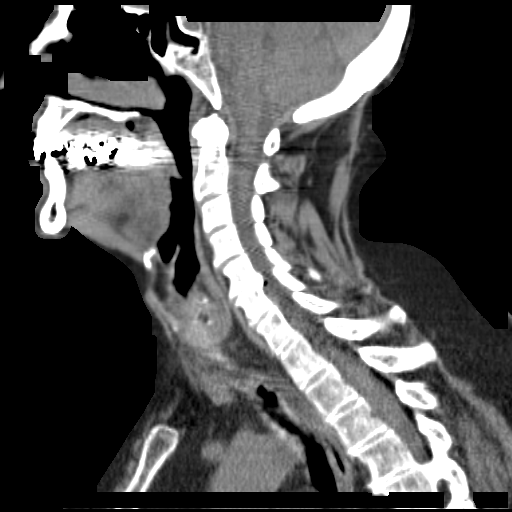
[im 42/83  bone]
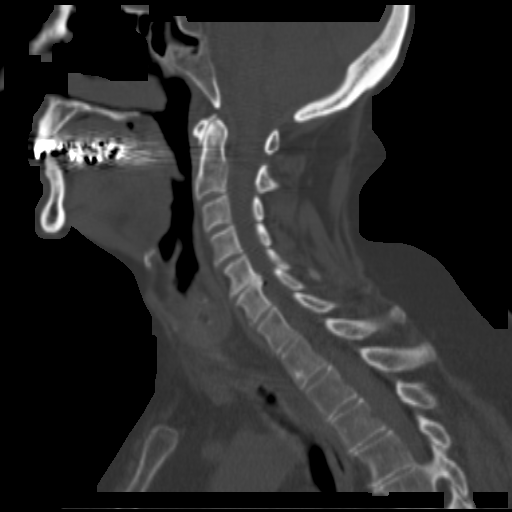
[im 48/83  bone]
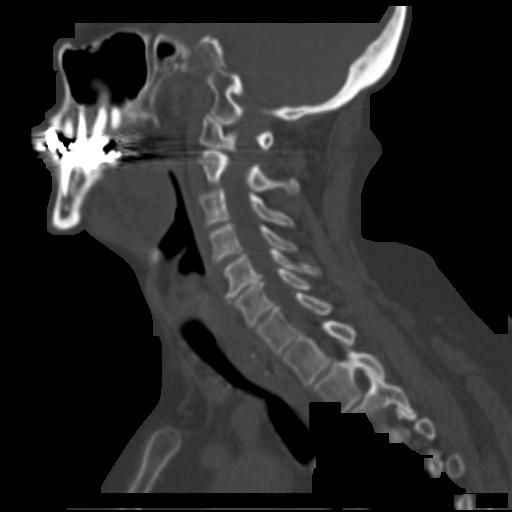
[im 55/83  bone]
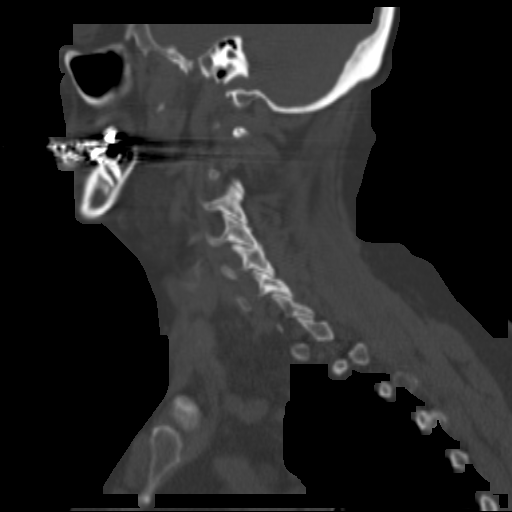

[Series 5: neck 2.0 soft tissue coro · coronal · 0.48mm/px · 3 of 125 slices shown]
[im 32/125  bone]
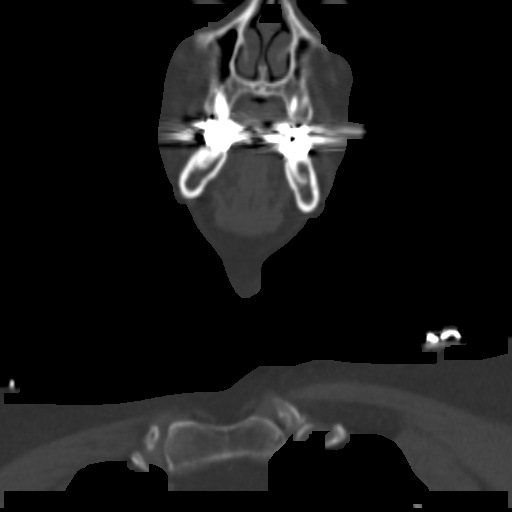
[im 52/125  bone]
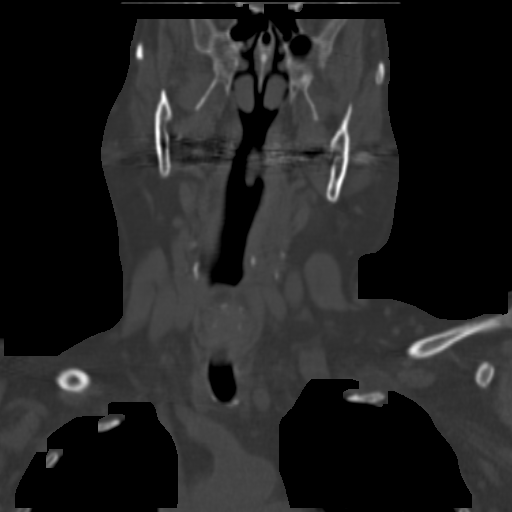
[im 73/125  bone]
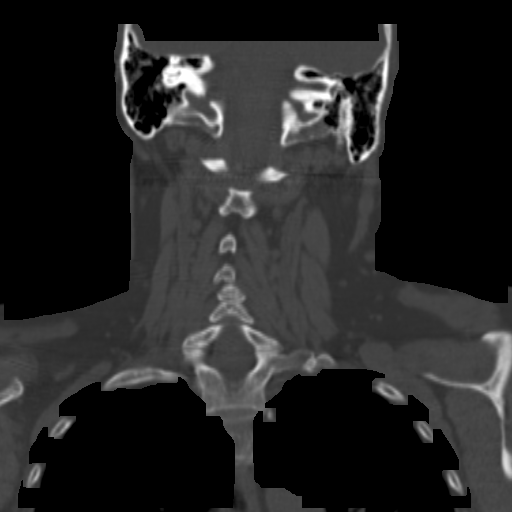

[Series 6: axial soft tissue neck 2.0 · axial · 0.36mm/px · z∈[+107,+147]mm · 2 of 123 slices shown]
[im 21/123  soft-tissue]
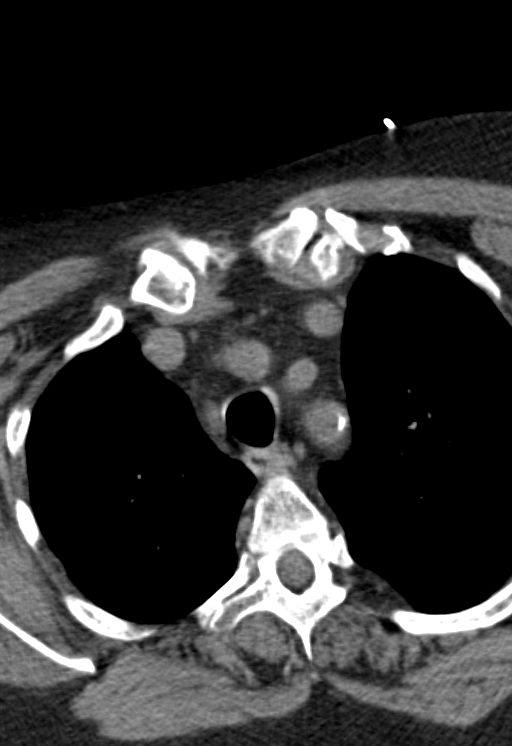
[im 41/123  soft-tissue]
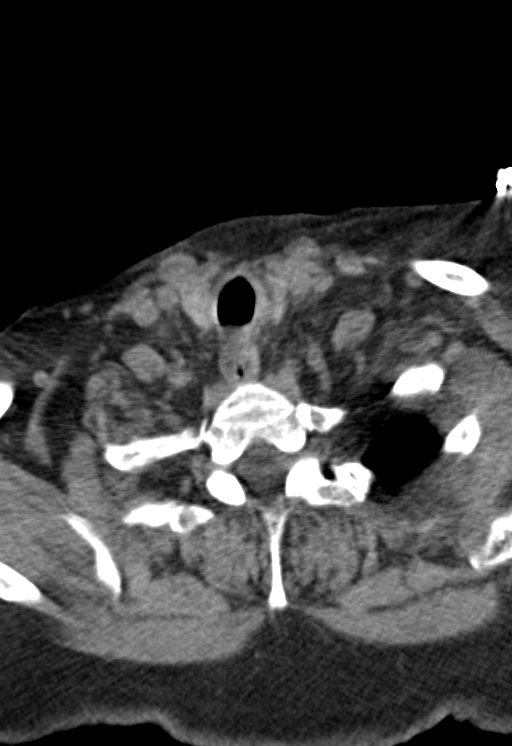

[15 of 33 positions shown; findings below may reference images not displayed]

FINDINGS: Pharynx and larynx: Oropharynx and nasopharynx normal. Epiglottis
normal. Polypoid soft tissue mass projects into the supraglottic
airway. This appears to be a polyp arising from the aryepiglottic
fold on the right. This measures 10 x 9 mm on axial images and has
soft tissue density. Is well-circumscribed. Vocal cords appear
normal.

Salivary glands: Fatty submandibular and parotid gland without mass
lesion.

Thyroid: Negative

Lymph nodes: Negative for adenopathy.

Vascular: Carotid artery calcification bilaterally.

Limited intracranial: Negative

Visualized orbits: Bilateral lens extraction.  No orbital mass.

Mastoids and visualized paranasal sinuses: Negative

Skeleton: Cervical spondylosis at C5-6.  No acute bony lesion.

Upper chest: Soft tissue thickening along the medial pleura of the
right upper lobe unchanged from chest CT 06/10/2011. This is most
consistent with right upper lobe scarring.
IMPRESSION: Polypoid mass arising from the aryepiglottic fold on the right
projecting into the supraglottic airway. This measures 10 x 9 mm.
This is probably a benign polyp however direct visualization and
biopsy recommended to exclude neoplasm. No adenopathy.

Right upper lobe scarring is stable.
